# Patient Record
Sex: Female | Born: 1960
Health system: Southern US, Community
[De-identification: ages and names within clinical notes are randomized; demographics above are authoritative.]

## PROBLEM LIST (undated history)

## (undated) DIAGNOSIS — R7301 Impaired fasting glucose: Secondary | ICD-10-CM

## (undated) DIAGNOSIS — Z87442 Personal history of urinary calculi: Secondary | ICD-10-CM

## (undated) DIAGNOSIS — R519 Headache, unspecified: Secondary | ICD-10-CM

## (undated) DIAGNOSIS — I454 Nonspecific intraventricular block: Secondary | ICD-10-CM

## (undated) DIAGNOSIS — I10 Essential (primary) hypertension: Secondary | ICD-10-CM

## (undated) DIAGNOSIS — M1990 Unspecified osteoarthritis, unspecified site: Secondary | ICD-10-CM

## (undated) DIAGNOSIS — M5126 Other intervertebral disc displacement, lumbar region: Secondary | ICD-10-CM

## (undated) DIAGNOSIS — H04123 Dry eye syndrome of bilateral lacrimal glands: Secondary | ICD-10-CM

## (undated) DIAGNOSIS — I493 Ventricular premature depolarization: Secondary | ICD-10-CM

## (undated) DIAGNOSIS — M797 Fibromyalgia: Secondary | ICD-10-CM

## (undated) DIAGNOSIS — R42 Dizziness and giddiness: Secondary | ICD-10-CM

## (undated) DIAGNOSIS — Z8619 Personal history of other infectious and parasitic diseases: Secondary | ICD-10-CM

## (undated) DIAGNOSIS — R32 Unspecified urinary incontinence: Secondary | ICD-10-CM

## (undated) DIAGNOSIS — N390 Urinary tract infection, site not specified: Secondary | ICD-10-CM

## (undated) DIAGNOSIS — K219 Gastro-esophageal reflux disease without esophagitis: Secondary | ICD-10-CM

## (undated) DIAGNOSIS — M8580 Other specified disorders of bone density and structure, unspecified site: Secondary | ICD-10-CM

## (undated) DIAGNOSIS — M5030 Other cervical disc degeneration, unspecified cervical region: Secondary | ICD-10-CM

## (undated) DIAGNOSIS — E785 Hyperlipidemia, unspecified: Secondary | ICD-10-CM

## (undated) DIAGNOSIS — N3281 Overactive bladder: Secondary | ICD-10-CM

## (undated) DIAGNOSIS — K5732 Diverticulitis of large intestine without perforation or abscess without bleeding: Secondary | ICD-10-CM

## (undated) DIAGNOSIS — R51 Headache: Secondary | ICD-10-CM

## (undated) HISTORY — DX: Gastro-esophageal reflux disease without esophagitis: K21.9

## (undated) HISTORY — DX: Essential (primary) hypertension: I10

## (undated) HISTORY — DX: Ventricular premature depolarization: I49.3

## (undated) HISTORY — DX: Dry eye syndrome of bilateral lacrimal glands: H04.123

## (undated) HISTORY — DX: Dizziness and giddiness: R42

## (undated) HISTORY — DX: Impaired fasting glucose: R73.01

## (undated) HISTORY — DX: Urinary tract infection, site not specified: N39.0

## (undated) HISTORY — DX: Other specified disorders of bone density and structure, unspecified site: M85.80

## (undated) HISTORY — DX: Unspecified osteoarthritis, unspecified site: M19.90

## (undated) HISTORY — DX: Hyperlipidemia, unspecified: E78.5

## (undated) HISTORY — DX: Fibromyalgia: M79.7

## (undated) HISTORY — DX: Overactive bladder: N32.81

## (undated) HISTORY — DX: Personal history of urinary calculi: Z87.442

## (undated) HISTORY — PX: OOPHORECTOMY: SHX86

## (undated) HISTORY — DX: Diverticulitis of large intestine without perforation or abscess without bleeding: K57.32

---

## 2004-09-04 ENCOUNTER — Ambulatory Visit: Payer: Self-pay | Admitting: Urology

## 2005-05-22 ENCOUNTER — Ambulatory Visit: Payer: Self-pay | Admitting: Obstetrics and Gynecology

## 2006-07-05 ENCOUNTER — Ambulatory Visit: Payer: Self-pay | Admitting: Obstetrics and Gynecology

## 2006-11-28 ENCOUNTER — Ambulatory Visit: Payer: Self-pay | Admitting: Family Medicine

## 2007-07-15 ENCOUNTER — Ambulatory Visit: Payer: Self-pay | Admitting: Obstetrics and Gynecology

## 2008-07-20 ENCOUNTER — Ambulatory Visit: Payer: Self-pay | Admitting: Obstetrics and Gynecology

## 2009-07-07 DIAGNOSIS — K5732 Diverticulitis of large intestine without perforation or abscess without bleeding: Secondary | ICD-10-CM

## 2009-07-07 HISTORY — DX: Diverticulitis of large intestine without perforation or abscess without bleeding: K57.32

## 2009-07-21 ENCOUNTER — Ambulatory Visit: Payer: Self-pay | Admitting: Obstetrics and Gynecology

## 2010-05-19 ENCOUNTER — Ambulatory Visit: Payer: Self-pay | Admitting: Gastroenterology

## 2010-07-23 HISTORY — PX: TOTAL ABDOMINAL HYSTERECTOMY: SHX209

## 2010-08-04 ENCOUNTER — Ambulatory Visit: Payer: Self-pay | Admitting: Obstetrics and Gynecology

## 2010-08-15 ENCOUNTER — Ambulatory Visit: Payer: Self-pay | Admitting: Obstetrics and Gynecology

## 2010-08-21 ENCOUNTER — Inpatient Hospital Stay: Payer: Self-pay | Admitting: Obstetrics and Gynecology

## 2010-08-23 LAB — PATHOLOGY REPORT

## 2011-03-19 ENCOUNTER — Ambulatory Visit: Payer: Self-pay

## 2011-11-08 ENCOUNTER — Ambulatory Visit: Payer: Self-pay | Admitting: Obstetrics and Gynecology

## 2012-11-27 ENCOUNTER — Ambulatory Visit: Payer: Self-pay | Admitting: Obstetrics and Gynecology

## 2013-01-29 ENCOUNTER — Ambulatory Visit: Payer: Self-pay | Admitting: Orthopedic Surgery

## 2013-11-09 ENCOUNTER — Ambulatory Visit: Payer: Self-pay | Admitting: Gastroenterology

## 2013-11-13 ENCOUNTER — Ambulatory Visit: Payer: Self-pay | Admitting: Gastroenterology

## 2013-11-13 HISTORY — PX: ESOPHAGOGASTRODUODENOSCOPY: SHX1529

## 2013-11-16 LAB — PATHOLOGY REPORT

## 2013-12-02 ENCOUNTER — Ambulatory Visit: Payer: Self-pay

## 2013-12-30 ENCOUNTER — Emergency Department: Payer: Self-pay | Admitting: Emergency Medicine

## 2013-12-30 LAB — BASIC METABOLIC PANEL
ANION GAP: 7 (ref 7–16)
BUN: 13 mg/dL (ref 7–18)
Calcium, Total: 9 mg/dL (ref 8.5–10.1)
Chloride: 105 mmol/L (ref 98–107)
Co2: 27 mmol/L (ref 21–32)
Creatinine: 0.73 mg/dL (ref 0.60–1.30)
EGFR (African American): >60
EGFR (Non-African Amer.): >60
Glucose: 101 mg/dL — ABNORMAL HIGH (ref 65–99)
OSMOLALITY: 278 (ref 275–301)
Potassium: 3.7 mmol/L (ref 3.5–5.1)
Sodium: 139 mmol/L (ref 136–145)

## 2013-12-30 LAB — URINALYSIS, COMPLETE
BACTERIA: NONE SEEN
BILIRUBIN, UR: NEGATIVE
BLOOD: NEGATIVE
Glucose,UR: NEGATIVE mg/dL (ref 0–75)
Ketone: NEGATIVE
Leukocyte Esterase: NEGATIVE
Nitrite: NEGATIVE
Ph: 6 (ref 4.5–8.0)
Protein: NEGATIVE
SQUAMOUS EPITHELIAL: NONE SEEN
Specific Gravity: 1.004 (ref 1.003–1.030)
WBC UR: <1 /HPF (ref 0–5)

## 2013-12-30 LAB — CBC
HCT: 42.1 % (ref 35.0–47.0)
HGB: 14.2 g/dL (ref 12.0–16.0)
MCH: 28.8 pg (ref 26.0–34.0)
MCHC: 33.8 g/dL (ref 32.0–36.0)
MCV: 85 fL (ref 80–100)
PLATELETS: 264 10*3/uL (ref 150–440)
RBC: 4.94 10*6/uL (ref 3.80–5.20)
RDW: 13.5 % (ref 11.5–14.5)
WBC: 11.4 10*3/uL — ABNORMAL HIGH (ref 3.6–11.0)

## 2013-12-30 LAB — TROPONIN I

## 2014-12-22 ENCOUNTER — Other Ambulatory Visit: Payer: Self-pay | Admitting: Family Medicine

## 2014-12-22 DIAGNOSIS — Z1231 Encounter for screening mammogram for malignant neoplasm of breast: Secondary | ICD-10-CM

## 2014-12-31 ENCOUNTER — Telehealth: Payer: Self-pay | Admitting: Family Medicine

## 2014-12-31 NOTE — Telephone Encounter (Signed)
Pt called and stated that she is having pain in back and stomach and thinks it may be a bladder infection. i advised her to go to urgent care and she says she will call us back to schedule a follow up and cpe.

## 2015-01-05 ENCOUNTER — Ambulatory Visit
Admission: RE | Admit: 2015-01-05 | Discharge: 2015-01-05 | Disposition: A | Discharge status: Home / Self Care | Payer: 59 | Source: Ambulatory Visit | Attending: Family Medicine | Admitting: Family Medicine

## 2015-01-05 DIAGNOSIS — Z1231 Encounter for screening mammogram for malignant neoplasm of breast: Secondary | ICD-10-CM | POA: Diagnosis not present

## 2015-01-14 DIAGNOSIS — E785 Hyperlipidemia, unspecified: Secondary | ICD-10-CM | POA: Insufficient documentation

## 2015-01-14 DIAGNOSIS — M1990 Unspecified osteoarthritis, unspecified site: Secondary | ICD-10-CM | POA: Insufficient documentation

## 2015-01-14 DIAGNOSIS — K219 Gastro-esophageal reflux disease without esophagitis: Secondary | ICD-10-CM | POA: Insufficient documentation

## 2015-01-14 DIAGNOSIS — M797 Fibromyalgia: Secondary | ICD-10-CM | POA: Insufficient documentation

## 2015-01-14 DIAGNOSIS — I10 Essential (primary) hypertension: Secondary | ICD-10-CM | POA: Insufficient documentation

## 2015-01-19 ENCOUNTER — Encounter: Payer: Self-pay | Admitting: Family Medicine

## 2015-01-19 ENCOUNTER — Ambulatory Visit (INDEPENDENT_AMBULATORY_CARE_PROVIDER_SITE_OTHER): Payer: 59 | Admitting: Family Medicine

## 2015-01-19 VITALS — BP 131/86 | HR 96 | Temp 98.1°F | Ht 63.0 in | Wt 169.0 lb

## 2015-01-19 DIAGNOSIS — R350 Frequency of micturition: Secondary | ICD-10-CM

## 2015-01-19 DIAGNOSIS — R339 Retention of urine, unspecified: Secondary | ICD-10-CM

## 2015-01-19 DIAGNOSIS — E663 Overweight: Secondary | ICD-10-CM

## 2015-01-19 DIAGNOSIS — Z0000 Encounter for general adult medical examination without abnormal findings: Secondary | ICD-10-CM

## 2015-01-19 LAB — MICROSCOPIC EXAMINATION

## 2015-01-19 NOTE — Patient Instructions (Addendum)
We'll let you know about your labs, either through a mailed letter or you can check MyChart for your results Check out the information at familydoctor.org entitled "What It Takes to Lose Weight" (hand-out at check-out) Try to lose between 1-2 pounds per week by taking in fewer calories and burning off more calories You can succeed by limiting portions, limiting foods dense in calories and fat, becoming more active, and drinking 8 glasses of water a day Don't skip meals, especially breakfast, as skipping meals may alter your metabolism Do not use over-the-counter weight loss pills or gimmicks that claim rapid weight loss A healthy BMI (or body mass index) is between 18.5 and 24.9 You can calculate your ideal BMI at the NIH website http://www.nhlbi.nih.gov/health/educational/lose_wt/BMI/bmicalc.htm We'll refer you to see the urologist about your bladder symptoms  Health Maintenance Adopting a healthy lifestyle and getting preventive care can go a long way to promote health and wellness. Talk with your health care provider about what schedule of regular examinations is right for you. This is a good chance for you to check in with your provider about disease prevention and staying healthy. In between checkups, there are plenty of things you can do on your own. Experts have done a lot of research about which lifestyle changes and preventive measures are most likely to keep you healthy. Ask your health care provider for more information. WEIGHT AND DIET  Eat a healthy diet  Be sure to include plenty of vegetables, fruits, low-fat dairy products, and lean protein.  Do not eat a lot of foods high in solid fats, added sugars, or salt.  Get regular exercise. This is one of the most important things you can do for your health.  Most adults should exercise for at least 150 minutes each week. The exercise should increase your heart rate and make you sweat (moderate-intensity exercise).  Most adults should  also do strengthening exercises at least twice a week. This is in addition to the moderate-intensity exercise.  Maintain a healthy weight  Body mass index (BMI) is a measurement that can be used to identify possible weight problems. It estimates body fat based on height and weight. Your health care provider can help determine your BMI and help you achieve or maintain a healthy weight.  For females 20 years of age and older:   A BMI below 18.5 is considered underweight.  A BMI of 18.5 to 24.9 is normal.  A BMI of 25 to 29.9 is considered overweight.  A BMI of 30 and above is considered obese.  Watch levels of cholesterol and blood lipids  You should start having your blood tested for lipids and cholesterol at 56 years of age, then have this test every 5 years.  You may need to have your cholesterol levels checked more often if:  Your lipid or cholesterol levels are high.  You are older than 56 years of age.  You are at high risk for heart disease.  CANCER SCREENING   Lung Cancer  Lung cancer screening is recommended for adults 55-80 years old who are at high risk for lung cancer because of a history of smoking.  A yearly low-dose CT scan of the lungs is recommended for people who:  Currently smoke.  Have quit within the past 15 years.  Have at least a 30-pack-year history of smoking. A pack year is smoking an average of one pack of cigarettes a day for 1 year.  Yearly screening should continue until it has been   15 years since you quit.  Yearly screening should stop if you develop a health problem that would prevent you from having lung cancer treatment.  Breast Cancer  Practice breast self-awareness. This means understanding how your breasts normally appear and feel.  It also means doing regular breast self-exams. Let your health care provider know about any changes, no matter how small.  If you are in your 20s or 30s, you should have a clinical breast exam (CBE)  by a health care provider every 1-3 years as part of a regular health exam.  If you are 40 or older, have a CBE every year. Also consider having a breast X-ray (mammogram) every year.  If you have a family history of breast cancer, talk to your health care provider about genetic screening.  If you are at high risk for breast cancer, talk to your health care provider about having an MRI and a mammogram every year.  Breast cancer gene (BRCA) assessment is recommended for women who have family members with BRCA-related cancers. BRCA-related cancers include:  Breast.  Ovarian.  Tubal.  Peritoneal cancers.  Results of the assessment will determine the need for genetic counseling and BRCA1 and BRCA2 testing. Cervical Cancer Routine pelvic examinations to screen for cervical cancer are no longer recommended for nonpregnant women who are considered low risk for cancer of the pelvic organs (ovaries, uterus, and vagina) and who do not have symptoms. A pelvic examination may be necessary if you have symptoms including those associated with pelvic infections. Ask your health care provider if a screening pelvic exam is right for you.   The Pap test is the screening test for cervical cancer for women who are considered at risk.  If you had a hysterectomy for a problem that was not cancer or a condition that could lead to cancer, then you no longer need Pap tests.  If you are older than 65 years, and you have had normal Pap tests for the past 10 years, you no longer need to have Pap tests.  If you have had past treatment for cervical cancer or a condition that could lead to cancer, you need Pap tests and screening for cancer for at least 20 years after your treatment.  If you no longer get a Pap test, assess your risk factors if they change (such as having a new sexual partner). This can affect whether you should start being screened again.  Some women have medical problems that increase their  chance of getting cervical cancer. If this is the case for you, your health care provider may recommend more frequent screening and Pap tests.  The human papillomavirus (HPV) test is another test that may be used for cervical cancer screening. The HPV test looks for the virus that can cause cell changes in the cervix. The cells collected during the Pap test can be tested for HPV.  The HPV test can be used to screen women 30 years of age and older. Getting tested for HPV can extend the interval between normal Pap tests from three to five years.  An HPV test also should be used to screen women of any age who have unclear Pap test results.  After 56 years of age, women should have HPV testing as often as Pap tests.  Colorectal Cancer  This type of cancer can be detected and often prevented.  Routine colorectal cancer screening usually begins at 56 years of age and continues through 56 years of age.  Your health   care provider may recommend screening at an earlier age if you have risk factors for colon cancer.  Your health care provider may also recommend using home test kits to check for hidden blood in the stool.  A small camera at the end of a tube can be used to examine your colon directly (sigmoidoscopy or colonoscopy). This is done to check for the earliest forms of colorectal cancer.  Routine screening usually begins at age 92.  Direct examination of the colon should be repeated every 5-10 years through 56 years of age. However, you may need to be screened more often if early forms of precancerous polyps or small growths are found. Skin Cancer  Check your skin from head to toe regularly.  Tell your health care provider about any new moles or changes in moles, especially if there is a change in a mole's shape or color.  Also tell your health care provider if you have a mole that is larger than the size of a pencil eraser.  Always use sunscreen. Apply sunscreen liberally and  repeatedly throughout the day.  Protect yourself by wearing long sleeves, pants, a wide-brimmed hat, and sunglasses whenever you are outside. HEART DISEASE, DIABETES, AND HIGH BLOOD PRESSURE   Have your blood pressure checked at least every 1-2 years. High blood pressure causes heart disease and increases the risk of stroke.  If you are between 29 years and 47 years old, ask your health care provider if you should take aspirin to prevent strokes.  Have regular diabetes screenings. This involves taking a blood sample to check your fasting blood sugar level.  If you are at a normal weight and have a low risk for diabetes, have this test once every three years after 56 years of age.  If you are overweight and have a high risk for diabetes, consider being tested at a younger age or more often. PREVENTING INFECTION  Hepatitis B  If you have a higher risk for hepatitis B, you should be screened for this virus. You are considered at high risk for hepatitis B if:  You were born in a country where hepatitis B is common. Ask your health care provider which countries are considered high risk.  Your parents were born in a high-risk country, and you have not been immunized against hepatitis B (hepatitis B vaccine).  You have HIV or AIDS.  You use needles to inject street drugs.  You live with someone who has hepatitis B.  You have had sex with someone who has hepatitis B.  You get hemodialysis treatment.  You take certain medicines for conditions, including cancer, organ transplantation, and autoimmune conditions. Hepatitis C  Blood testing is recommended for:  Everyone born from [birth year] through 1967.  Anyone with known risk factors for hepatitis C. Sexually transmitted infections (STIs)  You should be screened for sexually transmitted infections (STIs) including gonorrhea and chlamydia if:  You are sexually active and are younger than 56 years of age.  You are older than 56 years of  age and your health care provider tells you that you are at risk for this type of infection.  Your sexual activity has changed since you were last screened and you are at an increased risk for chlamydia or gonorrhea. Ask your health care provider if you are at risk.  If you do not have HIV, but are at risk, it may be recommended that you take a prescription medicine daily to prevent HIV infection. This is called pre-exposure  prophylaxis (PrEP). You are considered at risk if:  You are sexually active and do not regularly use condoms or know the HIV status of your partner(s).  You take drugs by injection.  You are sexually active with a partner who has HIV. Talk with your health care provider about whether you are at high risk of being infected with HIV. If you choose to begin PrEP, you should first be tested for HIV. You should then be tested every 3 months for as long as you are taking PrEP.  PREGNANCY   If you are premenopausal and you may become pregnant, ask your health care provider about preconception counseling.  If you may become pregnant, take 400 to 800 micrograms (mcg) of folic acid every day.  If you want to prevent pregnancy, talk to your health care provider about birth control (contraception). OSTEOPOROSIS AND MENOPAUSE   Osteoporosis is a disease in which the bones lose minerals and strength with aging. This can result in serious bone fractures. Your risk for osteoporosis can be identified using a bone density scan.  If you are 65 years of age or older, or if you are at risk for osteoporosis and fractures, ask your health care provider if you should be screened.  Ask your health care provider whether you should take a calcium or vitamin D supplement to lower your risk for osteoporosis.  Menopause may have certain physical symptoms and risks.  Hormone replacement therapy may reduce some of these symptoms and risks. Talk to your health care provider about whether hormone  replacement therapy is right for you.  HOME CARE INSTRUCTIONS   Schedule regular health, dental, and eye exams.  Stay current with your immunizations.   Do not use any tobacco products including cigarettes, chewing tobacco, or electronic cigarettes.  If you are pregnant, do not drink alcohol.  If you are breastfeeding, limit how much and how often you drink alcohol.  Limit alcohol intake to no more than 1 drink per day for nonpregnant women. One drink equals 12 ounces of beer, 5 ounces of wine, or 1 ounces of hard liquor.  Do not use street drugs.  Do not share needles.  Ask your health care provider for help if you need support or information about quitting drugs.  Tell your health care provider if you often feel depressed.  Tell your health care provider if you have ever been abused or do not feel safe at home. Document Released: 01/22/2011 Document Revised: 11/23/2013 Document Reviewed: 06/10/2013 [employer] Patient Information 2015 [employer]. This information is not intended to replace advice given to you by your health care provider. Make sure you discuss any questions you have with your health care provider.  

## 2015-01-19 NOTE — Progress Notes (Addendum)
BP 131/86 mmHg  Pulse 96  Temp(Src) 98.1 F (36.7 C)  Ht 5\' 3"  (1.6 m)  Wt 169 lb (76.658 kg)  BMI 29.94 kg/m2  SpO2 97%   Subjective:    Patient ID: Whitney Cooper, female    DOB: 1960/09/27, 54 y.o.   MRN: [id number]  HPI: Whitney Cooper is a 54 y.o. female  Chief Complaint  Patient presents with  . Annual Exam   She had a bladder infection June 10th and went to the [facility]; took antibiotics cipro 500 mg BID x 7 days; finished those; no rash or problems; she does not think she empties completely, positional, stands or bends forward; Dr. Charlotte Sanes did her surgery and removed everything, no mention of bladder prolapse and no bladder tack; surgery was 2012 (full hysterectomy with BSO); large fibroid and heavy bleeding, no cancer; mother had trouble with her bladder not emptying completely and had something stretched  Past Medical History  Diagnosis Date  . Hypertension   . Hyperlipidemia   . Osteoarthritis   . GERD (gastroesophageal reflux disease)   . Fibromyalgia    Past Surgical History  Procedure Laterality Date  . Total abdominal hysterectomy  Jan 2012    Complete, due to heavy bleeding  . Total abdominal hysterectomy w/ bilateral salpingoophorectomy     Family History  Problem Relation Age of Onset  . Ovarian cancer Maternal Grandmother 66  . Cancer Maternal Grandmother   . Hypertension Mother   . Cancer Mother     skin  . Heart disease Father     CHF  . Hypertension Father   . Stroke Father     mini strokes  . Cancer Father     skin  . Heart attack Father   . Heart attack Maternal Grandfather   . Heart attack Paternal Grandfather   . Diabetes Maternal Uncle   . Thyroid disease Sister    Relevant past medical, surgical, family and social history reviewed and updated as indicated. Interim medical history since our last visit reviewed. Allergies and medications reviewed and updated.  Review of Systems  Constitutional: Positive for fever (June  11th with UTI, low grade, actually 100.1 or 100.2) and unexpected weight change (gained weight in the last year).  HENT: Negative for nosebleeds.        No trouble hearing  Eyes:       No trouble seeing, father has glaucoma so keeping check  Cardiovascular: Negative for palpitations (saw Dr. Nehemiah Massed, had recheck in the few months; quit caffeine) and leg swelling.  Gastrointestinal: Positive for constipation (IBS, so goes from constipation to loose stools). Negative for blood in stool.  Endocrine: Negative for cold intolerance, heat intolerance, polydipsia and polyphagia.  Genitourinary: Positive for dysuria, urgency, frequency and hematuria (a little blood with infection earlier this month). Negative for difficulty urinating.       She will urinate and then stand up and then feels like she has to go again; feels pressure  Musculoskeletal:       Tendonitis in the left elbow, took ibuprofen  Skin:       No worrisome moles; sees derm every November; one mole removed off of back and right shoulder lesion came off; she'll have that checked by them too  Allergic/Immunologic: Negative for environmental allergies and food allergies.  Neurological: Negative for tremors.  Hematological: Negative for adenopathy. Does not bruise/bleed easily.  Psychiatric/Behavioral: The patient is not nervous/anxious and is not hyperactive.  Per HPI unless specifically indicated above  Health Maintenance  Topic Date Due  . HIV Screening  12/27/1973  . PAP SMEAR  12/27/1976  . INFLUENZA VACCINE  02/21/2015  . MAMMOGRAM  01/04/2017  . TETANUS/TDAP  05/25/2018  . COLONOSCOPY  05/24/2020      Objective:    BP 131/86 mmHg  Pulse 96  Temp(Src) 98.1 F (36.7 C)  Ht 5\' 3"  (1.6 m)  Wt 169 lb (76.658 kg)  BMI 29.94 kg/m2  SpO2 97%  Wt Readings from Last 3 Encounters:  01/19/15 169 lb (76.658 kg)  09/06/14 172 lb (78.019 kg)    Physical Exam  Constitutional: She appears well-developed and well-nourished.   HENT:  Head: Normocephalic and atraumatic.  Eyes: Conjunctivae and EOM are normal. Right eye exhibits no hordeolum. Left eye exhibits no hordeolum. No scleral icterus.  Neck: Carotid bruit is not present. No thyromegaly present.  Cardiovascular: Normal rate, regular rhythm, S1 normal, S2 normal and normal heart sounds.   No extrasystoles are present.  Pulmonary/Chest: Effort normal and breath sounds normal. No respiratory distress. Right breast exhibits no inverted nipple, no mass, no nipple discharge, no skin change and no tenderness. Left breast exhibits no inverted nipple, no mass, no nipple discharge, no skin change and no tenderness. Breasts are symmetrical.  Abdominal: Soft. Normal appearance and bowel sounds are normal. She exhibits no distension, no abdominal bruit, no pulsatile midline mass and no mass. There is no hepatosplenomegaly. There is no tenderness. No hernia.  Genitourinary: Pelvic exam was performed with patient prone. There is no rash or lesion on the right labia. There is no rash or lesion on the left labia.  Musculoskeletal: Normal range of motion. She exhibits no edema.  Lymphadenopathy:       Head (right side): No submandibular adenopathy present.       Head (left side): No submandibular adenopathy present.    She has no cervical adenopathy.    She has no axillary adenopathy.  Neurological: She is alert. She displays no tremor. No cranial nerve deficit. She exhibits normal muscle tone. Gait normal.  Skin: Skin is warm and dry. No bruising and no ecchymosis noted. No cyanosis. No pallor.  Psychiatric: Her speech is normal and behavior is normal. Thought content normal. Her mood appears not anxious. She does not exhibit a depressed mood.      Assessment & Plan:   Problem List Items Addressed This Visit      Other   Overweight (BMI 25.0-29.9)    Modest weight loss encouraged      Urinary retention with incomplete bladder emptying   Relevant Orders   Ambulatory  referral to Urology    Other Visit Diagnoses    Urinary frequency    -  Primary    check urine today; she may have some issues with retention, offered referral to urologist    Relevant Orders    UA/M w/rflx Culture, Routine    Ambulatory referral to Urology    Preventative health care        Relevant Orders    Comprehensive metabolic panel    Lipid Panel w/o Chol/HDL Ratio    CBC with Differential/Platelet    TSH       Follow up plan: Return in about 1 year (around 01/19/2016) for physical exam.  Orders Placed This Encounter  Procedures  . Comprehensive metabolic panel  . Lipid Panel w/o Chol/HDL Ratio  . CBC with Differential/Platelet  . TSH  . UA/M w/rflx  Culture, Routine  . Ambulatory referral to Urology   An After Visit Summary was printed and given to the patient.

## 2015-01-19 NOTE — Addendum Note (Signed)
Addended by: LADA, Satira Anis on: 01/19/2015 10:51 AM   Modules accepted: Orders, SmartSet

## 2015-01-19 NOTE — Assessment & Plan Note (Signed)
Modest weight loss encouraged

## 2015-01-20 LAB — CBC WITH DIFFERENTIAL/PLATELET
BASOS ABS: 0 10*3/uL (ref 0.0–0.2)
Basos: 0 %
EOS (ABSOLUTE): 0.2 10*3/uL (ref 0.0–0.4)
EOS: 2 %
HEMOGLOBIN: 14 g/dL (ref 11.1–15.9)
Hematocrit: 40.8 % (ref 34.0–46.6)
IMMATURE GRANULOCYTES: 0 %
Immature Grans (Abs): 0 10*3/uL (ref 0.0–0.1)
LYMPHS: 25 %
Lymphocytes Absolute: 2.2 10*3/uL (ref 0.7–3.1)
MCH: 29 pg (ref 26.6–33.0)
MCHC: 34.3 g/dL (ref 31.5–35.7)
MCV: 85 fL (ref 79–97)
MONOCYTES: 7 %
MONOS ABS: 0.6 10*3/uL (ref 0.1–0.9)
Neutrophils Absolute: 5.6 10*3/uL (ref 1.4–7.0)
Neutrophils: 66 %
PLATELETS: 351 10*3/uL (ref 150–379)
RBC: 4.83 x10E6/uL (ref 3.77–5.28)
RDW: 13.5 % (ref 12.3–15.4)
WBC: 8.7 10*3/uL (ref 3.4–10.8)

## 2015-01-20 LAB — LIPID PANEL W/O CHOL/HDL RATIO
Cholesterol, Total: 196 mg/dL (ref 100–199)
HDL: 42 mg/dL (ref >39)
LDL Calculated: 108 mg/dL — ABNORMAL HIGH (ref 0–99)
Triglycerides: 228 mg/dL — ABNORMAL HIGH (ref 0–149)
VLDL Cholesterol Cal: 46 mg/dL — ABNORMAL HIGH (ref 5–40)

## 2015-01-20 LAB — COMPREHENSIVE METABOLIC PANEL
ALT: 23 IU/L (ref 0–32)
AST: 20 IU/L (ref 0–40)
Albumin/Globulin Ratio: 1.8 (ref 1.1–2.5)
Albumin: 4.6 g/dL (ref 3.5–5.5)
Alkaline Phosphatase: 90 IU/L (ref 39–117)
BUN/Creatinine Ratio: 13 (ref 9–23)
BUN: 9 mg/dL (ref 6–24)
Bilirubin Total: 0.4 mg/dL (ref 0.0–1.2)
CO2: 25 mmol/L (ref 18–29)
Calcium: 10.1 mg/dL (ref 8.7–10.2)
Chloride: 102 mmol/L (ref 97–108)
Creatinine, Ser: 0.71 mg/dL (ref 0.57–1.00)
GFR calc Af Amer: 110 mL/min/{1.73_m2} (ref >59)
GFR calc non Af Amer: 96 mL/min/{1.73_m2} (ref >59)
GLOBULIN, TOTAL: 2.6 g/dL (ref 1.5–4.5)
GLUCOSE: 105 mg/dL — AB (ref 65–99)
Potassium: 4.4 mmol/L (ref 3.5–5.2)
Sodium: 143 mmol/L (ref 134–144)
Total Protein: 7.2 g/dL (ref 6.0–8.5)

## 2015-01-20 LAB — TSH: TSH: 5.06 u[IU]/mL — AB (ref 0.450–4.500)

## 2015-01-21 LAB — UA/M W/RFLX CULTURE, ROUTINE: ORGANISM ID, BACTERIA: NO GROWTH

## 2015-01-24 ENCOUNTER — Encounter: Payer: Self-pay | Admitting: Family Medicine

## 2015-01-24 DIAGNOSIS — R7301 Impaired fasting glucose: Secondary | ICD-10-CM

## 2015-01-24 DIAGNOSIS — R946 Abnormal results of thyroid function studies: Secondary | ICD-10-CM | POA: Insufficient documentation

## 2015-01-24 HISTORY — DX: Impaired fasting glucose: R73.01

## 2015-01-27 ENCOUNTER — Other Ambulatory Visit: Payer: Self-pay

## 2015-01-29 MED ORDER — METOPROLOL SUCCINATE ER 50 MG PO TB24: 50.0000 mg | ORAL_TABLET | Freq: Every day | ORAL | Status: DC

## 2015-02-11 ENCOUNTER — Telehealth: Payer: Self-pay

## 2015-02-11 NOTE — Telephone Encounter (Signed)
Received a voicemail from patient. Called and spoke with patient, she will pick up a copy of her labs from 01/19/15.

## 2015-02-14 ENCOUNTER — Ambulatory Visit: Payer: 59 | Admitting: Urology

## 2015-03-15 ENCOUNTER — Telehealth: Payer: Self-pay

## 2015-03-15 ENCOUNTER — Encounter: Payer: Self-pay | Admitting: Urology

## 2015-03-15 ENCOUNTER — Ambulatory Visit (INDEPENDENT_AMBULATORY_CARE_PROVIDER_SITE_OTHER): Payer: 59 | Admitting: Urology

## 2015-03-15 ENCOUNTER — Ambulatory Visit
Admission: RE | Admit: 2015-03-15 | Discharge: 2015-03-15 | Disposition: A | Discharge status: Home / Self Care | Payer: 59 | Source: Ambulatory Visit | Attending: Urology | Admitting: Urology

## 2015-03-15 VITALS — BP 133/84 | HR 90 | Ht 63.5 in | Wt 163.4 lb

## 2015-03-15 DIAGNOSIS — R300 Dysuria: Secondary | ICD-10-CM | POA: Diagnosis not present

## 2015-03-15 DIAGNOSIS — R350 Frequency of micturition: Secondary | ICD-10-CM | POA: Diagnosis not present

## 2015-03-15 DIAGNOSIS — R109 Unspecified abdominal pain: Secondary | ICD-10-CM | POA: Diagnosis not present

## 2015-03-15 DIAGNOSIS — N952 Postmenopausal atrophic vaginitis: Secondary | ICD-10-CM | POA: Diagnosis not present

## 2015-03-15 DIAGNOSIS — R32 Unspecified urinary incontinence: Secondary | ICD-10-CM | POA: Diagnosis not present

## 2015-03-15 LAB — URINALYSIS, COMPLETE
Bilirubin, UA: NEGATIVE
Glucose, UA: NEGATIVE
Ketones, UA: NEGATIVE
LEUKOCYTES UA: NEGATIVE
Nitrite, UA: NEGATIVE
PH UA: 6.5 (ref 5.0–7.5)
Protein, UA: NEGATIVE
RBC, UA: NEGATIVE
Specific Gravity, UA: 1.01 (ref 1.005–1.030)
Urobilinogen, Ur: 0.2 mg/dL (ref 0.2–1.0)

## 2015-03-15 LAB — MICROSCOPIC EXAMINATION
BACTERIA UA: NONE SEEN
Epithelial Cells (non renal): NONE SEEN /hpf (ref 0–10)
RBC MICROSCOPIC, UA: NONE SEEN /HPF (ref 0–?)
WBC UA: NONE SEEN /HPF (ref 0–?)

## 2015-03-15 LAB — BLADDER SCAN AMB NON-IMAGING: Scan Result: 33

## 2015-03-15 MED ORDER — ESTRADIOL 0.1 MG/GM VA CREA: 1.0000 | TOPICAL_CREAM | Freq: Every day | VAGINAL | Status: DC

## 2015-03-15 NOTE — Progress Notes (Signed)
03/15/2015 9:07 AM   Whitney Cooper 11-19-1960 [id number]  Referring provider: Arnetha Courser, MD [address] [place], [place] [address]  Chief Complaint  Patient presents with  . Urinary Frequency    referred by [facility] Dr. Marzetta Board  . Urinary Retention    HPI: Whitney Cooper is a 54 year old white female who presents today at the request of her primary care physician, Dr. Robby Sermon for frequent urination and burning.  Patient has had baseline symptoms of frequent urination, urgency, straining you where he, nocturia, stress urinary incontinence, urgency incontinence, intermittency and post void dribbling for several years.  In fact 10 years ago, she was enrolled in an overactive bladder study with her gynecologist. She states she took some medications during the study, but they provided no relief of her symptoms.  Her PVR today is 33 mL.  Her UA is unremarkable.  More recently, she had a fever 101 and felt that she had a UTI. She was seen at the walk-in clinic on 12/31/2014. She was prescribed an antibiotic urine culture returned negative. She states the antibiotic did provide relief and when she followed up with her primary care physician on 01/19/2015 her urine culture was negative at that time. She also states that she experienced a urinary tract infection in February earlier this year, but I do not have those laboratory results available to me at this visit. She has not had any microscopic or gross hematuria.  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  Uristat available over-the-counter seems to help ease her urinary symptoms but does not completely abate.  She does not consume caffeinated beverages, juices, sodas or alcohol. She states she only drinks water.  She has irritable bowel syndrome associated with diarrhea and fibromyalgia.  She also has been experiencing right-sided flank pain  intermittently over the last several months.  She does have a prior history of kidney stone disease, but she states she has not been bothered with stones for several years. She has not had a cystoscopic examination.  She is also experiencing dyspareunia.  Her pain during sexual intercourse is a result of vaginal dryness and friction.   PMH: Past Medical History  Diagnosis Date  . Hypertension   . Hyperlipidemia   . Osteoarthritis   . GERD (gastroesophageal reflux disease)   . Fibromyalgia   . Recurrent UTI   . OAB (overactive bladder)   . History of kidney stones     Surgical History: Past Surgical History  Procedure Laterality Date  . Total abdominal hysterectomy  Jan 2012    Complete, due to heavy bleeding    Home Medications:    Medication List       This list is accurate as of: 03/15/15  9:07 AM.  Always use your most recent med list.               amitriptyline 10 MG tablet  Commonly known as:  ELAVIL  Take 20 mg by mouth at bedtime.     atorvastatin 20 MG tablet  Commonly known as:  LIPITOR  Take 20 mg by mouth at bedtime.     Co Q-10 100 MG Caps  Take by mouth daily.     metoprolol succinate 50 MG 24 hr tablet  Commonly known as:  TOPROL XL  Take 1 tablet (50 mg total) by mouth daily.     MULTI-VITAMINS  Tabs  Take by mouth daily.     pantoprazole 40 MG tablet  Commonly known as:  PROTONIX  Take 40 mg by mouth daily.     Vitamin D3 2000 UNITS capsule  Take 2,000 Units by mouth daily.        Allergies:  Allergies  Allergen Reactions  . Codeine Other (See Comments)  . Sulfa Antibiotics Rash    Family History: Family History  Problem Relation Age of Onset  . Ovarian cancer Maternal Grandmother 66  . Cancer Maternal Grandmother   . Hypertension Mother   . Cancer Mother     skin  . Heart disease Father     CHF  . Hypertension Father   . Stroke Father     mini strokes  . Cancer Father     skin  . Heart attack Father   . Heart  attack Maternal Grandfather   . Heart attack Paternal Grandfather   . Diabetes Maternal Uncle   . Thyroid disease Sister     Social History:  reports that she has never smoked. She has never used smokeless tobacco. She reports that she does not drink alcohol or use illicit drugs.  ROS: UROLOGY Frequent Urination?: Yes Hard to postpone urination?: Yes Burning/pain with urination?: Yes Get up at night to urinate?: Yes Leakage of urine?: Yes Urine stream starts and stops?: Yes Trouble starting stream?: No Do you have to strain to urinate?: No Blood in urine?: No Urinary tract infection?: Yes Sexually transmitted disease?: No Injury to kidneys or bladder?: No Painful intercourse?: Yes Weak stream?: No Currently pregnant?: No Vaginal bleeding?: No Last menstrual period?: n  Gastrointestinal Nausea?: No Vomiting?: No Indigestion/heartburn?: Yes Diarrhea?: Yes Constipation?: No  Constitutional Fever: Yes Night sweats?: Yes Weight loss?: No Fatigue?: No  Skin Skin rash/lesions?: No Itching?: No  Eyes Blurred vision?: No Double vision?: No  Ears/Nose/Throat Sore throat?: No Sinus problems?: No  Hematologic/Lymphatic Swollen glands?: No Easy bruising?: No  Cardiovascular Leg swelling?: No Chest pain?: No  Respiratory Cough?: No Shortness of breath?: No  Endocrine Excessive thirst?: No  Musculoskeletal Back pain?: Yes Joint pain?: Yes  Neurological Headaches?: No Dizziness?: No  Psychologic Depression?: No Anxiety?: No  Physical Exam: BP 133/84 mmHg  Pulse 90  Ht 5' 3.5" (1.613 m)  Wt 163 lb 6.4 oz (74.118 kg)  BMI 28.49 kg/m2  Constitutional:  Alert and oriented, No acute distress. HEENT: [place] AT, moist mucus membranes.  Trachea midline, no masses. Cardiovascular: No clubbing, cyanosis, or edema. Respiratory: Normal respiratory effort, no increased work of breathing. GI: Abdomen is soft, non tender, non distended, no abdominal masses GU:  No CVA tenderness. No tenderness, inflammation, rashes or lesions of external genitalia, urethral meatus normal, no discharge (Moderate urethral hypermobility without demonstrable SUI), urethra normal, bladder non-tender, no palpable masses (Fairly good vaginal vault support, no evidence of apical descent, significant cystocele or rectocele with Valsalva), normal vaginal walls and mucosa, no lesions, cervix, uterus and adnexa are surgically absent, no normal cervix,  palpable masses. Skin: No rashes, bruises or suspicious lesions. Lymph: No cervical or inguinal adenopathy. Neurologic: Grossly intact, no focal deficits, moving all 4 extremities. Psychiatric: Normal mood and affect.  Laboratory Data: Results for orders placed or performed in visit on 03/15/15  Microscopic Examination  Result Value Ref Range   WBC, UA None seen 0 -  5 /hpf   RBC, UA None seen 0 -  2 /hpf   Epithelial Cells (non renal) None seen 0 -  10 /hpf   Bacteria, UA None seen None seen/Few  Urinalysis, Complete  Result Value Ref Range   Specific Gravity, UA 1.010 1.005 - 1.030   pH, UA 6.5 5.0 - 7.5   Color, UA Yellow Yellow   Appearance Ur Clear Clear   Leukocytes, UA Negative Negative   Protein, UA Negative Negative/Trace   Glucose, UA Negative Negative   Ketones, UA Negative Negative   RBC, UA Negative Negative   Bilirubin, UA Negative Negative   Urobilinogen, Ur 0.2 0.2 - 1.0 mg/dL   Nitrite, UA Negative Negative   Microscopic Examination See below:   BLADDER SCAN AMB NON-IMAGING  Result Value Ref Range   Scan Result 33     Lab Results  Component Value Date   WBC 8.7 01/19/2015   HGB 14.2 12/30/2013   HCT 40.8 01/19/2015   MCV 85 12/30/2013   PLT 264 12/30/2013    Lab Results  Component Value Date   CREATININE 0.71 01/19/2015    Urinalysis  Assessment & Plan:    1. Urinary frequency:    Patient has had a long-standing history of urinary frequency with negative urine cultures. She also has IBS  and fibromyalgia. I'm highly suspicious for interstitial cystitis. If no other etiologies found for her irritative voiding symptoms, she may benefit from pelvic floor physical therapy   - Urinalysis, Complete - BLADDER SCAN AMB NON-IMAGING  2. [place]:    Patient has long-standing irritative voiding symptoms. She has not been evaluated with cystoscopy.   Given irritative voiding symptoms, recommend office cystoscopy/ urine cytology to r/o CIS.     3. Incontinence:    Patient has mixed urinary incontinence with urge and stress associated with post void dribbling.   She had been involved with an overactive bladder study 10 years ago through her gynecologist office and placed on the medication which she found ineffective.   I believe she may benefit from UDS after cystoscopically/urine cytology have ruled out CIS and KUB has ruled out nephrolithiasis as a cause of her urinary symptoms.  4. Right flank pain:    The patient's past history of nephrolithiasis, I will obtain a KUB to rule out stones as a cause of any of her urinary symptomatology.  5. Atrophic vaginitis:   The patient was found to have atrophic vaginitis on exam and is experiencing dyspareunia of a frictional nature.  Patient was prescribed vaginal estrogen cream (ESTRACE) and instructed to apply 0.5mg  (pea-sized amount)  just inside the vaginal introitus with a finger-tip every night for two weeks and then Monday, Wednesday and Friday nights.  I explained to the patient that vaginally administered estrogen, which causes only a slight increase in the blood estrogen levels, have fewer contraindications and adverse systemic effects that oral HT.   No Follow-up on file.  Zara Council, [facility] [address] [place], [place] [address] [phone]

## 2015-03-15 NOTE — Telephone Encounter (Signed)
LMOM

## 2015-03-15 NOTE — Telephone Encounter (Signed)
-----   Message from Nori Riis, PA-C sent at 03/15/2015 11:25 AM EDT ----- Please let the patient know that no stone was seen.

## 2015-03-16 NOTE — Telephone Encounter (Signed)
Pt returned call. Made aware of negative study. Pt voiced understanding.

## 2015-03-16 NOTE — Telephone Encounter (Signed)
-----   Message from Nori Riis, PA-C sent at 03/15/2015 11:25 AM EDT ----- Please let the patient know that no stone was seen.

## 2015-03-22 ENCOUNTER — Other Ambulatory Visit: Payer: Self-pay

## 2015-03-22 MED ORDER — ATORVASTATIN CALCIUM 20 MG PO TABS: 20.0000 mg | ORAL_TABLET | Freq: Every day | ORAL | Status: DC

## 2015-03-22 NOTE — Telephone Encounter (Signed)
Routing to provider  

## 2015-04-19 ENCOUNTER — Other Ambulatory Visit: Payer: 59

## 2015-04-19 DIAGNOSIS — R7989 Other specified abnormal findings of blood chemistry: Secondary | ICD-10-CM

## 2015-04-20 LAB — TSH: TSH: 4.63 u[IU]/mL — AB (ref 0.450–4.500)

## 2015-04-26 ENCOUNTER — Telehealth: Payer: Self-pay | Admitting: Family Medicine

## 2015-04-26 NOTE — Telephone Encounter (Signed)
Please let patient know that her thyroid test has improved; it is still just a little bit outside the normal range We'll follow that over the coming months and years; it may go back to normal, or she may develop an underactive thyroid that we can treat with a little replacement (pill) She does not need medicine now Let us know in the future if fatigued, constipated, gaining weight, etc. Per the lab explanation in June, her glucose was a little elevated but we weren't going to recheck that for a year (next June)

## 2015-04-26 NOTE — Telephone Encounter (Signed)
Routing to provider. Is there anything that I need to tell this patient regarding her labs?

## 2015-04-26 NOTE — Telephone Encounter (Signed)
Called and notified patient of results.

## 2015-04-26 NOTE — Telephone Encounter (Signed)
Pt called stated someone was supposed to call her back with lab results, stated she has not heard anything. Pt stated there was concern about her sugar but her sugar was not tested.Please call her ASAP. Thanks.

## 2015-05-02 ENCOUNTER — Other Ambulatory Visit: Payer: Self-pay

## 2015-05-02 NOTE — Telephone Encounter (Signed)
Routing to provider  

## 2015-05-03 MED ORDER — AMITRIPTYLINE HCL 10 MG PO TABS: 20.0000 mg | ORAL_TABLET | Freq: Every day | ORAL | Status: DC

## 2015-05-03 NOTE — Telephone Encounter (Signed)
rx approved

## 2015-06-21 ENCOUNTER — Ambulatory Visit (INDEPENDENT_AMBULATORY_CARE_PROVIDER_SITE_OTHER): Payer: 59 | Admitting: Urology

## 2015-06-21 VITALS — Ht 63.5 in | Wt 156.7 lb

## 2015-06-21 DIAGNOSIS — N393 Stress incontinence (female) (male): Secondary | ICD-10-CM

## 2015-06-21 DIAGNOSIS — I493 Ventricular premature depolarization: Secondary | ICD-10-CM | POA: Insufficient documentation

## 2015-06-21 DIAGNOSIS — I10 Essential (primary) hypertension: Secondary | ICD-10-CM

## 2015-06-21 DIAGNOSIS — N3281 Overactive bladder: Secondary | ICD-10-CM

## 2015-06-21 DIAGNOSIS — R300 Dysuria: Secondary | ICD-10-CM | POA: Diagnosis not present

## 2015-06-21 DIAGNOSIS — E782 Mixed hyperlipidemia: Secondary | ICD-10-CM | POA: Insufficient documentation

## 2015-06-21 DIAGNOSIS — R350 Frequency of micturition: Secondary | ICD-10-CM

## 2015-06-21 HISTORY — DX: Essential (primary) hypertension: I10

## 2015-06-21 HISTORY — DX: Ventricular premature depolarization: I49.3

## 2015-06-21 LAB — MICROSCOPIC EXAMINATION
BACTERIA UA: NONE SEEN
RBC, UA: NONE SEEN /hpf (ref 0–?)

## 2015-06-21 LAB — URINALYSIS, COMPLETE
BILIRUBIN UA: NEGATIVE
Glucose, UA: NEGATIVE
Ketones, UA: NEGATIVE
NITRITE UA: NEGATIVE
PH UA: 6.5 (ref 5.0–7.5)
Protein, UA: NEGATIVE
Urobilinogen, Ur: 0.2 mg/dL (ref 0.2–1.0)

## 2015-06-21 MED ORDER — SOLIFENACIN SUCCINATE 5 MG PO TABS: 5.0000 mg | ORAL_TABLET | Freq: Every day | ORAL | Status: DC

## 2015-06-21 MED ORDER — LIDOCAINE HCL 2 % EX GEL
1.0000 "application " | Freq: Once | CUTANEOUS | Status: AC
  Administered 2015-06-21: 1 via URETHRAL

## 2015-06-21 MED ORDER — CIPROFLOXACIN HCL 500 MG PO TABS
500.0000 mg | ORAL_TABLET | Freq: Once | ORAL | Status: AC
  Administered 2015-06-21: 500 mg via ORAL

## 2015-06-21 NOTE — Progress Notes (Signed)
06/21/2015 9:12 AM   Whitney Cooper 07-21-1961 [id number]  Referring provider: Arnetha Courser, MD [address] [place], [place] [address]  Chief Complaint  Patient presents with  . Cysto    HPI:1. Urinary frequency: Shannon:    1. Urinary frequency: Patient has had a long-standing history of urinary frequency with negative urine cultures. She also has IBS and fibromyalgia. I'm highly suspicious for interstitial cystitis. If no other etiologies found for her irritative voiding symptoms, she may benefit from pelvic floor physical therapy    Patient has mixed urinary incontinence with urge and stress associated with post void dribbling. She had been involved with an overactive bladder study 10 years ago through her gynecologist office and placed on the medication which she found ineffective. I believe she may benefit from UDS after cystoscopically/urine cytology have ruled out CIS and KUB has ruled out nephrolithiasis as a cause of her urinary symptoms.   Also prescribed Estrace last visit  Today the patient reports hourly frequency. She has difficulty sitting through 2 are moving. She gets up once or twice a night. Her symptoms of present for multiple years. She's not tried other medications other than what is listed above. She has no urge incontinence. She is mild stress incontinence not requiring a pad. She's had kidney stones but no previous GU surgery. She does not have pelvic pain. She is intermittent mild dyspareunia. She voids frequently because of urgency and fullness  Modifying factors: There are no other modifying factors  Associated signs and symptoms: There are no other associated signs and symptoms Aggravating and relieving factors: There are no other aggravating or relieving factors Severity: Moderate Duration: Persistent    PMH: Past Medical History  Diagnosis Date  . Hypertension   . Hyperlipidemia   . Osteoarthritis   . GERD (gastroesophageal reflux disease)     . Fibromyalgia   . Recurrent UTI   . OAB (overactive bladder)   . History of kidney stones     Surgical History: Past Surgical History  Procedure Laterality Date  . Total abdominal hysterectomy  Jan 2012    Complete, due to heavy bleeding    Home Medications:    Medication List       This list is accurate as of: 06/21/15  9:12 AM.  Always use your most recent med list.               amitriptyline 10 MG tablet  Commonly known as:  ELAVIL  Take 2 tablets (20 mg total) by mouth at bedtime.     atorvastatin 20 MG tablet  Commonly known as:  LIPITOR  Take 1 tablet (20 mg total) by mouth at bedtime.     Co Q-10 100 MG Caps  Take by mouth daily.     estradiol 0.1 MG/GM vaginal cream  Commonly known as:  ESTRACE  Place 1 Applicatorful vaginally at bedtime.     metoprolol succinate 50 MG 24 hr tablet  Commonly known as:  TOPROL XL  Take 1 tablet (50 mg total) by mouth daily.     MULTI-VITAMINS Tabs  Take by mouth daily.     pantoprazole 40 MG tablet  Commonly known as:  PROTONIX  Take 40 mg by mouth daily.     Vitamin D3 2000 UNITS capsule  Take 2,000 Units by mouth daily.        Allergies:  Allergies  Allergen Reactions  . Codeine Other (See Comments)  . Sulfa Antibiotics Rash  Family History: Family History  Problem Relation Age of Onset  . Ovarian cancer Maternal Grandmother 66  . Cancer Maternal Grandmother   . Hypertension Mother   . Cancer Mother     skin  . Heart disease Father     CHF  . Hypertension Father   . Stroke Father     mini strokes  . Cancer Father     skin  . Heart attack Father   . Heart attack Maternal Grandfather   . Heart attack Paternal Grandfather   . Diabetes Maternal Uncle   . Thyroid disease Sister     Social History:  reports that she has never smoked. She has never used smokeless tobacco. She reports that she does not drink alcohol or use illicit drugs.  ROS: UROLOGY Frequent Urination?: Yes Hard to  postpone urination?: Yes Burning/pain with urination?: No Get up at night to urinate?: No Leakage of urine?: No Urine stream starts and stops?: Yes Trouble starting stream?: No Do you have to strain to urinate?: No Blood in urine?: No Urinary tract infection?: No Sexually transmitted disease?: No Injury to kidneys or bladder?: No Painful intercourse?: No Weak stream?: No Currently pregnant?: No Vaginal bleeding?: No Last menstrual period?: 2011  Gastrointestinal Nausea?: No Vomiting?: No Indigestion/heartburn?: Yes Diarrhea?: No Constipation?: No  Constitutional Fever: No Night sweats?: No Weight loss?: No Fatigue?: No  Skin Skin rash/lesions?: No Itching?: No  Eyes Blurred vision?: No Double vision?: No  Ears/Nose/Throat Sore throat?: No Sinus problems?: Yes  Hematologic/Lymphatic Swollen glands?: No Easy bruising?: No  Cardiovascular Leg swelling?: No Chest pain?: No  Respiratory Cough?: No Shortness of breath?: No  Endocrine Excessive thirst?: No  Musculoskeletal Back pain?: No Joint pain?: No  Neurological Headaches?: No Dizziness?: No  Psychologic Depression?: No Anxiety?: No  Physical Exam: Ht 5' 3.5" (1.613 m)  Wt 156 lb 11.2 oz (71.079 kg)  BMI 27.32 kg/m2  Constitutional:  Alert and oriented, No acute distress. HEENT: [place] AT, moist mucus membranes.  Trachea midline, no masses. Cardiovascular: No clubbing, cyanosis, or edema. Respiratory: Normal respiratory effort, no increased work of breathing. GI: Abdomen is soft, nontender, nondistended, no abdominal masses GU:  Well supported bladder neck and no stress incontinence Skin: No rashes, bruises or suspicious lesions. Lymph: No cervical or inguinal adenopathy. Neurologic: Grossly intact, no focal deficits, moving all 4 extremities. Psychiatric: Normal mood and affect.  Laboratory Data: Lab Results  Component Value Date   WBC 8.7 01/19/2015   HGB 14.2 12/30/2013   HCT 40.8  01/19/2015   MCV 85 12/30/2013   PLT 264 12/30/2013    Lab Results  Component Value Date   CREATININE 0.71 01/19/2015    No results found for: PSA  No results found for: TESTOSTERONE  No results found for: HGBA1C  Urinalysis    Component Value Date/Time   COLORURINE Colorless 12/30/2013 2325   APPEARANCEUR Clear 12/30/2013 2325   LABSPEC 1.004 12/30/2013 2325   PHURINE 6.0 12/30/2013 2325   GLUCOSEU Negative 03/15/2015 0844   GLUCOSEU Negative 12/30/2013 2325   HGBUR Negative 12/30/2013 2325   BILIRUBINUR Negative 03/15/2015 0844   BILIRUBINUR Negative 12/30/2013 2325   KETONESUR Negative 12/30/2013 2325   PROTEINUR Negative 12/30/2013 2325   NITRITE Negative 03/15/2015 0844   NITRITE Negative 12/30/2013 2325   LEUKOCYTESUR Negative 03/15/2015 0844   LEUKOCYTESUR Negative 12/30/2013 2325   Cystoscopy: After verbal consent sterile cystoscopy was performed. The bladder mucosa and trigone were normal. There is no stitch or foreign  body or carcinoma. There was no bladder pain with insertion or with filling of her bladder. She tolerated the procedure well  Pertinent Imaging: None  Assessment & Plan:  The patient has urinary frequency and mild stress incontinence. My index of suspicion is low that she is interstitial cystitis. The role of behavioral and medical therapy discussed.  The patient will be started on Vesicare 5 mg samples and prescription. I will reassess her in approximately 1 month. She may have tried Toviaz in the past. I will consult physical therapy. She reaches her treatment goal I will stop the medication moving forward and this was discussed. Physical therapy will be directed towards her mild stress incontinence and her OAB.  1. Mild stress incontinence 2. Urinary frequency 3. Nocturia  Reece Packer, MD  [facility] [address] [place], [place] [address] [phone]

## 2015-06-21 NOTE — Addendum Note (Signed)
Addended by: Wilson Singer on: 06/21/2015 09:18 AM   Modules accepted: Orders

## 2015-07-08 ENCOUNTER — Encounter: Payer: Self-pay | Admitting: Physical Therapy

## 2015-07-08 ENCOUNTER — Ambulatory Visit: Payer: 59 | Attending: Family Medicine | Admitting: Physical Therapy

## 2015-07-08 DIAGNOSIS — R279 Unspecified lack of coordination: Secondary | ICD-10-CM | POA: Diagnosis not present

## 2015-07-08 DIAGNOSIS — M629 Disorder of muscle, unspecified: Secondary | ICD-10-CM

## 2015-07-08 NOTE — Therapy (Addendum)
[place] MAIN [facility] SERVICES [address] [place], [place], [address] Phone: [phone]   Fax:  [phone]  Physical Therapy Evaluation  Patient Details  Name: Whitney Cooper MRN: [id number] Date of Birth: 1961/01/08 No Data Recorded  Encounter Date: 07/08/2015      PT End of Session - 07/11/15 2357    Visit Number 1   Number of Visits 12   Date for PT Re-Evaluation 09/30/15   PT Start Time 0800   PT Stop Time 0900   PT Time Calculation (min) 60 min   Activity Tolerance Patient tolerated treatment well;No increased pain   Behavior During Therapy [facility] for tasks assessed/performed      Past Medical History  Diagnosis Date  . Hypertension   . Hyperlipidemia   . Osteoarthritis   . GERD (gastroesophageal reflux disease)   . Fibromyalgia   . Recurrent UTI   . OAB (overactive bladder)   . History of kidney stones     Past Surgical History  Procedure Laterality Date  . Total abdominal hysterectomy  Jan 2012    Complete, due to heavy bleeding and grape-fruit size fibroid     There were no vitals filed for this visit.  Visit Diagnosis:  Lack of coordination - Plan: PT plan of care cert/re-cert  Fascial defect - Plan: PT plan of care cert/re-cert          [facility] PT Assessment - 07/12/15 0007    Assessment   Medical Diagnosis N39.3, R35.0    Precautions   Precautions None   Restrictions   Weight Bearing Restrictions No   Prior Function   Level of Independence Independent   Observation/Other Assessments   Observations slumped posture   Other Surveys  --  PFDI 68%    Coordination   Gross Motor Movements are Fluid and Coordinated --  abdominal straining/pelvic floor straining w/ cue for bowel    Posture/Postural Control   Posture/Postural Control --  lumbopelvic instability w/ ASLR   AROM   Overall AROM Comments hinging at L4 in lumbar ext standing   Palpation   Palpation comment tenderness at L LQ area    increased scar restriction over abdomen, increased tensions at upper traps   Bed Mobility   Bed Mobility --  crunch method OOB, able to log roll                   [facility] Adult PT Treatment/Exercise - 07/12/15 0007    Self-Care   Self-Care --  POC, goals, anatomy/phsyiology   Neuro Re-ed    Neuro Re-ed Details  toileting posture, log rolling, sitting posture   diaphragmatic breathing   Manual Therapy   Manual therapy comments upper trap releases bilaterally-STM                PT Education - 07/11/15 2356    Education provided Yes   Education Details POC, goals, HEP, anatomy/ physiology   Person(s) Educated Patient   Methods Explanation;Demonstration;Tactile cues;Verbal cues;Handout   Comprehension Verbalized understanding;Returned demonstration             PT Long Term Goals - 07/11/15 2358    PT LONG TERM GOAL #1   Title Pt will  report decreased frequency of urination from 2x/hr to 1x/ every 2 hr in order to compeltes tasks at work .   Time 12   Period Weeks   Status New   PT LONG TERM GOAL #2   Title Pt  will report no leakage when she receives a hug from her son nor when she gets up from sitting.    Time 12   Period Weeks   Status New   PT LONG TERM GOAL #3   Title Pt will report decreased with initial penetration from 6/10 to < 3/10 pain 100% of the time.    Time 12   Period Weeks   Status New   PT LONG TERM GOAL #4   Title Pt will be compliant with relaxation techniques to better manage IBS in order to decrease diarrhea episodes from 1x/ mo to zero times a month.    Time 12   Period Weeks   Status New   PT LONG TERM GOAL #5   Title Pt will decrease her score on PFDI from 68% to < 50% in order to improve urogenital/ bowel function.    Time 12   Period Weeks   Status New               Plan - 07/12/15 0003    Clinical Impression Statement Pt is a 54 yo female whose S & Sx consist of spinal deficits, chest breathing pattern,  poor pelvic floor and deep core coordination and strength,  increased abdominal scar restriction, tenderness upon palpation at L LQ abdomen, and poor toileting posture/ body mechanics that pose downward pressure on pelvic floor. These deficits limit her QOL, ADLs, and relationship with husband.    Pt will benefit from skilled therapeutic intervention in order to improve on the following deficits Improper body mechanics;Increased muscle spasms;Decreased strength;Postural dysfunction;Decreased mobility;Decreased activity tolerance;Impaired flexibility;Hypermobility;Decreased range of motion;Pain;Hypomobility;Decreased coordination;Decreased endurance;Decreased scar mobility;Increased fascial restricitons   Rehab Potential Good   Clinical Impairments Affecting Rehab Potential abdominal hysterectomy    PT Frequency 1x / week   PT Duration 12 weeks   PT Treatment/Interventions ADLs/Self Care Home Management;Biofeedback;Cryotherapy;Electrical Stimulation;Moist Heat;Patient/family education;Neuromuscular re-education;Therapeutic exercise;Therapeutic activities;Stair training;Gait training;Functional mobility training;Manual techniques;Scar mobilization;Passive range of motion;Dry needling;Energy conservation;Taping;Balance training   Consulted and Agree with Plan of Care Patient         Problem List Patient Active Problem List   Diagnosis Date Noted  . Essential (primary) hypertension 06/21/2015  . Combined fat and carbohydrate induced hyperlipemia 06/21/2015  . Beat, premature ventricular 06/21/2015  . Urinary frequency 03/15/2015  . Strangury 03/15/2015  . Incontinence 03/15/2015  . Right flank pain 03/15/2015  . Atrophic vaginitis 03/15/2015  . Abnormal thyroid function test 01/24/2015  . IFG (impaired fasting glucose) 01/24/2015  . Overweight (BMI 25.0-29.9) 01/19/2015  . Urinary retention with incomplete bladder emptying 01/19/2015  . Hypertension   . Hyperlipidemia   . Osteoarthritis    . GERD (gastroesophageal reflux disease)   . Fibromyalgia   . Diverticulitis of colon 07/07/2009    Jerl Mina  ,PT, DPT, E-RYT  07/12/2015, 8:06 AM  [place] MAIN [facility] SERVICES [address] [place], [place], [address] Phone: [phone]   Fax:  [phone]  Name: Whitney Cooper MRN: [id number] Date of Birth: 08-17-1960

## 2015-07-11 NOTE — Patient Instructions (Signed)
   Log rolling sheet

## 2015-07-12 ENCOUNTER — Ambulatory Visit: Payer: 59 | Admitting: Physical Therapy

## 2015-07-12 DIAGNOSIS — R279 Unspecified lack of coordination: Secondary | ICD-10-CM

## 2015-07-12 DIAGNOSIS — M629 Disorder of muscle, unspecified: Secondary | ICD-10-CM

## 2015-07-12 NOTE — Addendum Note (Signed)
Addended by: Jerl Mina on: 07/12/2015 08:07 AM   Modules accepted: Orders

## 2015-07-12 NOTE — Addendum Note (Signed)
Addended by: Jerl Mina on: 07/12/2015 12:16 AM   Modules accepted: Orders

## 2015-07-12 NOTE — Therapy (Addendum)
[place] MAIN [facility] SERVICES [address] [place], [place], [address] Phone: [phone]   Fax:  [phone]  Physical Therapy Treatment  Patient Details  Name: Whitney Cooper MRN: [id number] Date of Birth: 11-23-1960 No Data Recorded  Encounter Date: 07/12/2015      PT End of Session - 07/12/15 1059    Visit Number 2   Number of Visits 12   Date for PT Re-Evaluation 09/30/15   PT Start Time 0805   PT Stop Time 0900   PT Time Calculation (min) 55 min   Activity Tolerance Patient tolerated treatment well;No increased pain   Behavior During Therapy [facility] for tasks assessed/performed      Past Medical History  Diagnosis Date  . Hypertension   . Hyperlipidemia   . Osteoarthritis   . GERD (gastroesophageal reflux disease)   . Fibromyalgia   . Recurrent UTI   . OAB (overactive bladder)   . History of kidney stones     Past Surgical History  Procedure Laterality Date  . Total abdominal hysterectomy  Jan 2012    Complete, due to heavy bleeding and grape-fruit size fibroid     There were no vitals filed for this visit.  Visit Diagnosis:  Lack of coordination  Fascial defect      Subjective Assessment - 07/12/15 0810    Subjective Pt reported she has been practicing diaphragmatic breathing and log rolling.    Pertinent History Hx of complete hysterectomy, 2012, planned weight loss 17 lbs with plant based diet, decreased sugars,             [facility] PT Assessment - 07/12/15 1053    Observation/Other Assessments   Other Surveys  --  PFDI 68% and ZUNG anxiety 59%                   Pelvic Floor Special Questions - 07/12/15 1054    Pelvic Floor Internal Exam pt verbally consented without contraindications     Exam Type Vaginal   Palpation no mm tensions/tenderness noted  bladder minor dorsal position, more cephalad w/ contractions   Strength fair squeeze, definite lift   Strength # of reps 4   Strength # of  seconds 10   Biofeedback able to demo ROM, 5 quicks for Urge suppression technique            [facility] Adult PT Treatment/Exercise - 07/12/15 1053    Bed Mobility   Bed Mobility --  crunch method OOB, able to log roll   Posture/Postural Control   Posture/Postural Control --   Self-Care   Other Self-Care Comments  educated about relaxation techniques and use of Urge Suppression technique  educated downregulating nn system for urge/frequency   Neuro Re-ed    Neuro Re-ed Details  guided body scan, diaphragmatic breathing and pelvic floor coordination, quick contractions, visual imagery, Urge Suppression technique   Urge Suppression technique    Manual Therapy   Manual therapy comments light manual Tx, occiput and sacrum                 PT Education - 07/12/15 1054    Education provided Yes   Education Details HEP   Person(s) Educated Patient   Methods Explanation;Demonstration;Tactile cues;Verbal cues;Handout   Comprehension Returned demonstration;Verbalized understanding             PT Long Term Goals - 07/12/15 1554    PT LONG TERM GOAL #1   Title Pt will  report decreased frequency of urination from 2x/hr to 1x/ every 2 hr in order to compeltes tasks at work .   Time 12   Period Weeks   Status New   PT LONG TERM GOAL #2   Title Pt will report no leakage when she receives a hug from her son nor when she gets up from sitting.    Time 12   Period Weeks   Status New   PT LONG TERM GOAL #3   Title Pt will report decreased with initial penetration from 6/10 to < 3/10 pain 100% of the time.    Time 12   Period Weeks   Status New   PT LONG TERM GOAL #4   Title Pt will be compliant with relaxation techniques to better manage IBS in order to decrease diarrhea episodes from 1x/ mo to zero times a month.    Time 12   Period Weeks   Status New   PT LONG TERM GOAL #5   Title Pt will decrease her score on PFDI from 68% to < 50% in order to improve urogenital/ bowel  function.    Time 12   Period Weeks   Status New   Additional Long Term Goals   Additional Long Term Goals Yes   PT LONG TERM GOAL #6   Title Pt will decrease her ZUNG anxiety Scale from 59% to < 49% in order to show improved self-management of stress/ anxiety to minimize urgency and frequency.    Time 12   Period Weeks   Status New               Plan - 07/12/15 1059    Clinical Impression Statement Pt is progressing well with diaphragmatic breathing and pelvic floor ROM coordination/ quick contractions. Pt demo'd low endurance of pelvic floor muscles. Pt will continue to benefit from a biopsychosocial appraoch to address her stress/anxiety in order to better manage her urge and frequency Sx. Pt reported feeling "at peace" after practicing the body scan. Pt also demo'd Urge Suppression correctly. Plan to focus on pelvic floor strengthening at next session.    Pt will benefit from skilled therapeutic intervention in order to improve on the following deficits Improper body mechanics;Increased muscle spasms;Decreased strength;Postural dysfunction;Decreased mobility;Decreased activity tolerance;Impaired flexibility;Hypermobility;Decreased range of motion;Pain;Hypomobility;Decreased coordination;Decreased endurance;Decreased scar mobility;Increased fascial restricitons   Rehab Potential Good   Clinical Impairments Affecting Rehab Potential abdominal hysterectomy    PT Frequency 1x / week   PT Duration 12 weeks   PT Treatment/Interventions ADLs/Self Care Home Management;Biofeedback;Cryotherapy;Electrical Stimulation;Moist Heat;Patient/family education;Neuromuscular re-education;Therapeutic exercise;Therapeutic activities;Stair training;Gait training;Functional mobility training;Manual techniques;Scar mobilization;Passive range of motion;Dry needling;Energy conservation;Taping;Balance training   Consulted and Agree with Plan of Care Patient        Problem List Patient Active Problem List    Diagnosis Date Noted  . Essential (primary) hypertension 06/21/2015  . Combined fat and carbohydrate induced hyperlipemia 06/21/2015  . Beat, premature ventricular 06/21/2015  . Urinary frequency 03/15/2015  . Strangury 03/15/2015  . Incontinence 03/15/2015  . Right flank pain 03/15/2015  . Atrophic vaginitis 03/15/2015  . Abnormal thyroid function test 01/24/2015  . IFG (impaired fasting glucose) 01/24/2015  . Overweight (BMI 25.0-29.9) 01/19/2015  . Urinary retention with incomplete bladder emptying 01/19/2015  . Hypertension   . Hyperlipidemia   . Osteoarthritis   . GERD (gastroesophageal reflux disease)   . Fibromyalgia   . Diverticulitis of colon 07/07/2009    Jerl Mina ,PT, DPT, E-RYT  07/12/2015, 3:55 PM  [place] MAIN [facility] SERVICES [address] [place], [place], [address] Phone: [phone]   Fax:  [phone]  Name: Whitney Cooper MRN: [id number] Date of Birth: [birth year]

## 2015-07-12 NOTE — Patient Instructions (Signed)
URGE SUPPRESION TECHNIQUES  ? These techniques are to be used to suppress those abnormally strong URGES to urinate, especially after you have already urinated < 1hr ago.  ? These steps do not have to be followed in order, and not all steps have to be used.   ? The purpose of these steps is to help you regain control of your bladder, to reduce the amount of urinary urgency, frequency, or leaking.  ? They take practice to master in controlling urgency.  Allow yourself to be okay with leaking when you first start practicing these steps. ? Practice these steps first at home, when you do not have to worry as much about leaking.  1. SIT DOWN.  Pressure on the pelvic floor inhibits the bladder.  Further pressure may help, such as sitting on a small rolled up towel. 2. LIGHT "KEGELS" using elevator imagery.  Breathe in, feel pelvic floor lower to "basement" level by the end of the inhalation. Exhale, feel pelvic floor gradually move up to "1st floor" which is the neutral position of pelvic floor. At the end of exhalation, feel pelvic floor lift higher at which you will perform a quick light squeeze (the muscles that hold back gas and urine).  Do not do hard, maximal contractions, as this will quickly fatigue the muscles and cause leakage. Perform this 5 repetitions in this pattern.  3. BREATHE & STAY CALM.  Breathing slowly and remaining calm will inhibit your sympathetic nervous system, which will in turn calm the bladder.   4. DISTRACTION.  Sit with a project that will engage your mind.  Anything that works for you - reading, word puzzles, crochet, knitting, checking email, balancing the checkbook, and so on. 5. VISUALIZATION.  Imagine that you are in a place/situation in which either you cannot or do not want to leave.  Examples: in a car and cannot stop; lying on a beach with a far walk to a restroom; at dinner with someone special.  If the urge persists after practicing these steps and feel you must go to  the bathroom, then it is imperative that you . . . . ? Walk slowly and calmly to the bathroom ? Maintain calm breathing ? Refrain from undressing until you are standing over the toilet Rushing to the restroom will only encourage the strong bladder urges and leaking.  Again, the more you practice, the easier these steps will become.  _________________________________________________________  Body scan at night 3 cycles

## 2015-07-26 ENCOUNTER — Ambulatory Visit: Payer: 59 | Attending: Urology | Admitting: Physical Therapy

## 2015-07-26 DIAGNOSIS — M629 Disorder of muscle, unspecified: Secondary | ICD-10-CM | POA: Diagnosis present

## 2015-07-26 DIAGNOSIS — R279 Unspecified lack of coordination: Secondary | ICD-10-CM | POA: Diagnosis not present

## 2015-07-26 NOTE — Patient Instructions (Addendum)
PELVIC FLOOR / KEGEL EXERCISES   Pelvic floor/ Kegel exercises are used to strengthen the muscles in the base of your pelvis that are responsible for supporting your pelvic organs and preventing urine/feces leakage. Based on your therapist's recommendations, they can be performed while standing, sitting, or lying down. Imagine pelvic floor area as a diamond with pelvic landmarks: top =pubic bone, bottom tip=tailbone, sides=sitting bones (ischial tuberosities).    Make yourself aware of this muscle group by using these cues while coordinating your breath:  Inhale, feel pelvic floor diamond area lower like hammock towards your feet and ribcage/belly expanding. Pause. Let the exhale naturally and feel your belly sink, abdominal muscles hugging in around you and you may notice the pelvic diamond draws upward towards your head forming a umbrella shape. Give a squeeze during the exhalation like you are stopping the flow of urine. If you are squeezing the buttock muscles, try to give 50% less effort.   Common Errors:  Breath holding: If you are holding your breath, you may be bearing down against your bladder instead of pulling it up. If you belly bulges up while you are squeezing, you are holding your breath. Be sure to breathe gently in and out while exercising. Counting out loud may help you avoid holding your breath.  Accessory muscle use: You should not see or feel other muscle movement when performing pelvic floor exercises. When done properly, no one can tell that you are performing the exercises. Keep the buttocks, belly and inner thighs relaxed.  Overdoing it: Your muscles can fatigue and stop working for you if you over-exercise. You may actually leak more or feel soreness at the lower abdomen or rectum.  YOUR HOME EXERCISE PROGRAM  LONG HOLDS: Position: on back  Inhale and then exhale. Then squeeze the muscle and count aloud for 10 seconds. Rest with 4 long breaths. (Be sure to let belly  sink in with exhales and not push outward)  Perform 4 repetitions, 3 times/day                     DECREASE DOWNWARD PRESSURE ON  YOUR PELVIC FLOOR, ABDOMINAL, LOW BACK MUSCLES       PRESERVE YOUR PELVIC HEALTH LONG-TERM   ** SQUEEZE pelvic floor BEFORE YOUR SNEEZE, COUGH, LAUGH   ** EXHALE BEFORE YOU RISE AGAINST GRAVITY (lifting, sit to stand, from squat to stand)   ** LOG ROLL OUT OF BED INSTEAD OF CRUNCH/SIT-UP  _______________________________   Handout for spinal movements for stretches away/at desk  For decreasing muscle tensions and managing stress   ______________________________   [employer] toolkit:  Spinal stretches Body scan in laying down or seated  x3 cycle  Belly massage   _______________________________  Toileting in public restroom, full sitting not hovering

## 2015-07-27 NOTE — Therapy (Signed)
[place] MAIN [facility] SERVICES [address] [place], [place], [address] Phone: [phone]   Fax:  [phone]  Physical Therapy Treatment  Patient Details  Name: Whitney Cooper MRN: [id number] Date of Birth: 1961/05/19 No Data Recorded  Encounter Date: 07/26/2015      PT End of Session - 07/27/15 1027    Visit Number 3   Number of Visits 12   Date for PT Re-Evaluation 09/30/15   PT Start Time [id number]   PT Stop Time 1115   PT Time Calculation (min) 60 min   Activity Tolerance Patient tolerated treatment well;No increased pain   Behavior During Therapy [facility] for tasks assessed/performed      Past Medical History  Diagnosis Date  . Hypertension   . Hyperlipidemia   . Osteoarthritis   . GERD (gastroesophageal reflux disease)   . Fibromyalgia   . Recurrent UTI   . OAB (overactive bladder)   . History of kidney stones     Past Surgical History  Procedure Laterality Date  . Total abdominal hysterectomy  Jan 2012    Complete, due to heavy bleeding and grape-fruit size fibroid     There were no vitals filed for this visit.  Visit Diagnosis:  No diagnosis found.      Subjective Assessment - 07/26/15 1018    Subjective   Pt has noticed a decreased frequency in the afternoon.  Pt has found having her feet up on a stool has helped her to have better urinary control. Pt also stated she has had improvement with complete emptying. Pt reported she was able to make it through a movie with one trip to urinate. Pt has been stressed with family. Pt has tried abdominal massage 3 x the past week.  Pt stated she does squat [employer].   Pertinent History Hx of complete hysterectomy, 2012, planned weight loss 17 lbs with plant based diet, decreased sugars,             [facility] PT Assessment - 07/27/15 0800    Coordination   Gross Motor Movements are Fluid and Coordinated --  hip adducted in dyn stab level 2, required cues for align                   Pelvic Floor Special Questions - 07/27/15 0800    External Palpation 4 reps, 10 sec with cuing for technique without breathholing   through clothing            [place] Adult PT Treatment/Exercise - 07/27/15 0801    Self-Care   Other Self-Care Comments  relaxation technique with seated body scan, alternating thumb to finger, hand placement for reduction of stress, application of spinal ROM during work breaks to minimize mm tensions   toileting posture in public restrooms (no hovering/squat)    Neuro Re-ed    Neuro Re-ed Details  pelvic floor coordination, decrease breathholding   Exercises   Exercises --  see pt isntructions: spinal ROM/ stretches at work                 PT Education - 07/27/15 1026    Education provided Yes   Education Details HEP   Person(s) Educated Patient   Methods Demonstration;Explanation;Tactile cues;Verbal cues;Handout   Comprehension Verbalized understanding;Returned demonstration             PT Long Term Goals - 07/26/15 1019    PT LONG TERM GOAL #1   Title Pt  will  report decreased frequency of urination from 2x/hr to 1x/ every 2 hr in order to completes tasks at work .   Time 12   Period Weeks   Status Achieved   PT LONG TERM GOAL #2   Title Pt will report no leakage when she receives a hug from her son nor when she gets up from sitting.    Time 12   Period Weeks   Status On-going   PT LONG TERM GOAL #3   Title Pt will report decreased with initial penetration from 6/10 to < 3/10 pain 100% of the time.    Time 12   Period Weeks   Status On-going   PT LONG TERM GOAL #4   Title Pt will be compliant with relaxation techniques to better manage IBS in order to decrease diarrhea episodes from 1x/ mo to zero times a month.    Time 12   Period Weeks   Status On-going   PT LONG TERM GOAL #5   Title Pt will decrease her score on PFDI from 68% to < 50% in order to improve urogenital/ bowel function.    Time  12   Period Weeks   Status On-going   PT LONG TERM GOAL #6   Title Pt will decrease her ZUNG anxiety Scale from 59% to < 49% in order to show improved self-management of stress/ anxiety to minimize urgency and frequency.    Time 12   Period Weeks   Status On-going               Plan - 07/27/15 1027    Clinical Impression Statement Pt has achieved her goal with decreased urinary frequency and states she has less difficulty with initating urination. Pt has been able to sit through a movie with one trip to urinate. Pt required education on not squatting when using public toilets in order to optimize pelvic floor function.  Pt continues to benefit from relaxation training in order to minimize relapse of urinary urgency/frequency. Incorporated today spinal ROM and stretches for pt to perform at work to decrease mm tensions and stress. Progressed to pelvic floor endurance training and plan to progress to deep core strengthening at next visit. Pt will continue to benefit from skilled PT.    Pt will benefit from skilled therapeutic intervention in order to improve on the following deficits Improper body mechanics;Increased muscle spasms;Decreased strength;Postural dysfunction;Decreased mobility;Decreased activity tolerance;Impaired flexibility;Hypermobility;Decreased range of motion;Pain;Hypomobility;Decreased coordination;Decreased endurance;Decreased scar mobility;Increased fascial restricitons   Rehab Potential Good   Clinical Impairments Affecting Rehab Potential abdominal hysterectomy    PT Frequency 1x / week   PT Duration 12 weeks   PT Treatment/Interventions ADLs/Self Care Home Management;Biofeedback;Cryotherapy;Electrical Stimulation;Moist Heat;Patient/family education;Neuromuscular re-education;Therapeutic exercise;Therapeutic activities;Stair training;Gait training;Functional mobility training;Manual techniques;Scar mobilization;Passive range of motion;Dry needling;Energy  conservation;Taping;Balance training   Consulted and Agree with Plan of Care Patient        Problem List Patient Active Problem List   Diagnosis Date Noted  . Essential (primary) hypertension 06/21/2015  . Combined fat and carbohydrate induced hyperlipemia 06/21/2015  . Beat, premature ventricular 06/21/2015  . Urinary frequency 03/15/2015  . Strangury 03/15/2015  . Incontinence 03/15/2015  . Right flank pain 03/15/2015  . Atrophic vaginitis 03/15/2015  . Abnormal thyroid function test 01/24/2015  . IFG (impaired fasting glucose) 01/24/2015  . Overweight (BMI 25.0-29.9) 01/19/2015  . Urinary retention with incomplete bladder emptying 01/19/2015  . Hypertension   . Hyperlipidemia   . Osteoarthritis   . GERD (gastroesophageal  reflux disease)   . Fibromyalgia   . Diverticulitis of colon 07/07/2009    Jerl Mina ,PT, DPT, E-RYT  07/27/2015, 10:54 AM  [place] MAIN [facility] SERVICES [address] [place], [place], [address] Phone: [phone]   Fax:  [phone]  Name: Whitney Cooper MRN: [id number] Date of Birth: 1961/01/25

## 2015-08-01 ENCOUNTER — Ambulatory Visit: Payer: 59 | Admitting: Physical Therapy

## 2015-08-02 ENCOUNTER — Encounter: Payer: 59 | Admitting: Physical Therapy

## 2015-08-05 ENCOUNTER — Encounter: Payer: Self-pay | Admitting: Urology

## 2015-08-05 ENCOUNTER — Ambulatory Visit (INDEPENDENT_AMBULATORY_CARE_PROVIDER_SITE_OTHER): Payer: 59 | Admitting: Urology

## 2015-08-05 VITALS — BP 120/80 | HR 89 | Ht 63.5 in | Wt 155.5 lb

## 2015-08-05 DIAGNOSIS — R350 Frequency of micturition: Secondary | ICD-10-CM | POA: Diagnosis not present

## 2015-08-05 DIAGNOSIS — R32 Unspecified urinary incontinence: Secondary | ICD-10-CM

## 2015-08-05 LAB — MICROSCOPIC EXAMINATION: RBC, UA: NONE SEEN /hpf (ref 0–?)

## 2015-08-05 LAB — URINALYSIS, COMPLETE
BILIRUBIN UA: NEGATIVE
GLUCOSE, UA: NEGATIVE
Ketones, UA: NEGATIVE
LEUKOCYTES UA: NEGATIVE
Nitrite, UA: NEGATIVE
PROTEIN UA: NEGATIVE
RBC, UA: NEGATIVE
Specific Gravity, UA: 1.015 (ref 1.005–1.030)
UUROB: 0.2 mg/dL (ref 0.2–1.0)
pH, UA: 7 (ref 5.0–7.5)

## 2015-08-05 NOTE — Progress Notes (Signed)
08/05/2015 8:51 AM   Whitney Cooper 04-25-1961 [id number]  Referring provider: Arnetha Courser, MD [address] [place], [place] [address]  Chief Complaint  Patient presents with  . Follow-up    urinary frequency, mild incontinence    HPI:  Today the patient reports hourly frequency. She has difficulty sitting through 2 are moving. She gets up once or twice a night. Her symptoms of present for multiple years. She's not tried other medications other than what is listed above. She has no urge incontinence. She is mild stress incontinence not requiring a pad. She's had kidney stones but no previous GU surgery. She does not have pelvic pain. She is intermittent mild dyspareunia. She voids frequently because of urgency and fullness  The patient is here on Vesicare consult physical therapy. We are treating her mild stress incontinence urinary frequency and nighttime frequency. She may try Toviaz in the past     PMH: Past Medical History  Diagnosis Date  . Hypertension   . Hyperlipidemia   . Osteoarthritis   . GERD (gastroesophageal reflux disease)   . Fibromyalgia   . Recurrent UTI   . OAB (overactive bladder)   . History of kidney stones   . Beat, premature ventricular 06/21/2015  . Essential (primary) hypertension 06/21/2015  . Diverticulitis of colon 07/07/2009  . IFG (impaired fasting glucose) 01/24/2015    Surgical History: Past Surgical History  Procedure Laterality Date  . Total abdominal hysterectomy  Jan 2012    Complete, due to heavy bleeding and grape-fruit size fibroid     Home Medications:    Medication List       This list is accurate as of: 08/05/15  8:51 AM.  Always use your most recent med list.               amitriptyline 10 MG tablet  Commonly known as:  ELAVIL  Take 2 tablets (20 mg total) by mouth at bedtime.     atorvastatin 20 MG tablet  Commonly known as:  LIPITOR  Take 1 tablet (20 mg total) by mouth at bedtime.     Co Q-10 100 MG Caps  Take  by mouth daily.     estradiol 0.1 MG/GM vaginal cream  Commonly known as:  ESTRACE  Place 1 Applicatorful vaginally at bedtime.     metoprolol succinate 50 MG 24 hr tablet  Commonly known as:  TOPROL XL  Take 1 tablet (50 mg total) by mouth daily.     MULTI-VITAMINS Tabs  Take by mouth daily.     pantoprazole 40 MG tablet  Commonly known as:  PROTONIX  Take 40 mg by mouth daily.     solifenacin 5 MG tablet  Commonly known as:  VESICARE  Take 1 tablet (5 mg total) by mouth daily.     Vitamin D3 2000 units capsule  Take 2,000 Units by mouth daily.        Allergies:  Allergies  Allergen Reactions  . Codeine Other (See Comments)  . Sulfa Antibiotics Rash    Family History: Family History  Problem Relation Age of Onset  . Ovarian cancer Maternal Grandmother 66  . Cancer Maternal Grandmother   . Hypertension Mother   . Cancer Mother     skin  . Heart disease Father     CHF  . Hypertension Father   . Stroke Father     mini strokes  . Cancer Father     skin  . Heart  attack Father   . Heart attack Maternal Grandfather   . Heart attack Paternal Grandfather   . Diabetes Maternal Uncle   . Thyroid disease Sister     Social History:  reports that she has never smoked. She has never used smokeless tobacco. She reports that she does not drink alcohol or use illicit drugs.  ROS: UROLOGY Frequent Urination?: Yes Hard to postpone urination?: No Burning/pain with urination?: No Get up at night to urinate?: Yes (once at bedtime) Leakage of urine?: No Urine stream starts and stops?: No Trouble starting stream?: No Do you have to strain to urinate?: No Blood in urine?: No Urinary tract infection?: No Sexually transmitted disease?: No Injury to kidneys or bladder?: No Painful intercourse?: No Weak stream?: No Currently pregnant?: No Vaginal bleeding?: No Last menstrual period?: 2011  Psychologic Depression?: No Anxiety?: Yes  Physical Exam: BP 120/80 mmHg   Pulse 89  Ht 5' 3.5" (1.613 m)  Wt 70.534 kg (155 lb 8 oz)  BMI 27.11 kg/m2   Laboratory Data: Lab Results  Component Value Date   WBC 8.7 01/19/2015   HGB 14.2 12/30/2013   HCT 40.8 01/19/2015   MCV 85 01/19/2015   PLT 351 01/19/2015    Lab Results  Component Value Date   CREATININE 0.71 01/19/2015    No results found for: PSA  No results found for: TESTOSTERONE  No results found for: HGBA1C  Urinalysis    Component Value Date/Time   COLORURINE Colorless 12/30/2013 2325   APPEARANCEUR Clear 12/30/2013 2325   LABSPEC 1.004 12/30/2013 2325   PHURINE 6.0 12/30/2013 2325   GLUCOSEU Negative 06/21/2015 0840   GLUCOSEU Negative 12/30/2013 2325   HGBUR Negative 12/30/2013 2325   BILIRUBINUR Negative 06/21/2015 0840   BILIRUBINUR Negative 12/30/2013 2325   KETONESUR Negative 12/30/2013 2325   PROTEINUR Negative 12/30/2013 2325   NITRITE Negative 06/21/2015 0840   NITRITE Negative 12/30/2013 2325   LEUKOCYTESUR Trace* 06/21/2015 0840   LEUKOCYTESUR Negative 12/30/2013 2325    Pertinent Imaging: none  Assessment & Plan:  The patient is improving less frequency and urgency on physical therapy as a monotherapy. She doesn't want to take medication yet the Willis she needs to in the future. She is very pleased with physical therapy so far  1. Incontinence  2. Urinary frequency   plan: See on a when necessary basis. The patient starts Vesicare she'll make another appointment    Reece Packer, MD  [place] [address] [place], [place] [address] [phone]

## 2015-08-09 ENCOUNTER — Encounter: Payer: 59 | Admitting: Physical Therapy

## 2015-08-11 ENCOUNTER — Encounter: Payer: 59 | Admitting: Physical Therapy

## 2015-08-16 ENCOUNTER — Encounter: Payer: 59 | Admitting: Physical Therapy

## 2015-08-19 ENCOUNTER — Ambulatory Visit: Payer: 59 | Admitting: Physical Therapy

## 2015-08-19 DIAGNOSIS — R279 Unspecified lack of coordination: Secondary | ICD-10-CM | POA: Diagnosis not present

## 2015-08-19 DIAGNOSIS — M629 Disorder of muscle, unspecified: Secondary | ICD-10-CM

## 2015-08-19 NOTE — Therapy (Signed)
[place] MAIN [facility] SERVICES [address] [place], [place], [address] Phone: [phone]   Fax:  [phone]  Physical Therapy Treatment  Patient Details  Name: Whitney Cooper MRN: [id number] Date of Birth: 02/19/1961 No Data Recorded  Encounter Date: 08/19/2015      PT End of Session - 08/19/15 1312    Visit Number 4   Number of Visits 12   Date for PT Re-Evaluation 09/30/15   PT Start Time 0810   PT Stop Time 0900   PT Time Calculation (min) 50 min   Activity Tolerance Patient tolerated treatment well;No increased pain   Behavior During Therapy [facility] for tasks assessed/performed      Past Medical History  Diagnosis Date  . Hypertension   . Hyperlipidemia   . Osteoarthritis   . GERD (gastroesophageal reflux disease)   . Fibromyalgia   . Recurrent UTI   . OAB (overactive bladder)   . History of kidney stones   . Beat, premature ventricular 06/21/2015  . Essential (primary) hypertension 06/21/2015  . Diverticulitis of colon 07/07/2009  . IFG (impaired fasting glucose) 01/24/2015    Past Surgical History  Procedure Laterality Date  . Total abdominal hysterectomy  Jan 2012    Complete, due to heavy bleeding and grape-fruit size fibroid     There were no vitals filed for this visit.  Visit Diagnosis:  Lack of coordination  Fascial defect      Subjective Assessment - 08/19/15 0810    Subjective Pt reported she saw R. MacDiarmid and she expressed wanting to cotinue PT and hold off taking Vesicare. Pt reported that using the stool has helped alot. Pt had only one incident with leakage after toileting but she acknowledged she was rushing.    Pertinent History Hx of complete hysterectomy, 2012, planned weight loss 17 lbs with plant based diet, decreased sugars,             [facility] PT Assessment - 08/19/15 1308    Palpation   Palpation comment abdominal scar restrictions decreased. abdomen soft w/ slight stretch marks                    Pelvic Floor Special Questions - 08/19/15 1309    Pelvic Floor Internal Exam pt verbally consented without contraindications     Exam Type Vaginal   Palpation no mm tensions/tenderness noted  bladder in normal position   Strength good squeeze, good lift, able to hold agaisnt strong resistance   Strength # of reps 10   Strength # of seconds 10           [facility] Adult PT Treatment/Exercise - 08/19/15 1310    Self-Care   Other Self-Care Comments  abdominal massage for relaxation and deep sleep , discussed future visits to increased deep core strength to minimize relapse of Sx   Exercises   Exercises --  progressed pelvic floor endurance training to 10 reps, 10 se   Manual Therapy   Manual therapy comments abdominal massage and guided pt                       PT Long Term Goals - 07/26/15 1019    PT LONG TERM GOAL #1   Title Pt will  report decreased frequency of urination from 2x/hr to 1x/ every 2 hr in order to completes tasks at work .   Time 12   Period Weeks   Status Achieved  PT LONG TERM GOAL #2   Title Pt will report no leakage when she receives a hug from her son nor when she gets up from sitting.    Time 12   Period Weeks   Status On-going   PT LONG TERM GOAL #3   Title Pt will report decreased with initial penetration from 6/10 to < 3/10 pain 100% of the time.    Time 12   Period Weeks   Status On-going   PT LONG TERM GOAL #4   Title Pt will be compliant with relaxation techniques to better manage IBS in order to decrease diarrhea episodes from 1x/ mo to zero times a month.    Time 12   Period Weeks   Status On-going   PT LONG TERM GOAL #5   Title Pt will decrease her score on PFDI from 68% to < 50% in order to improve urogenital/ bowel function.    Time 12   Period Weeks   Status On-going   PT LONG TERM GOAL #6   Title Pt will decrease her ZUNG anxiety Scale from 59% to < 49% in order to show improved self-management  of stress/ anxiety to minimize urgency and frequency.    Time 12   Period Weeks   Status On-going               Plan - 08/19/15 1312    Clinical Impression Statement Pt is progressing well with better managed urinary frequency./ urgency Sx. Initiated abdominal massage today and progressed pelvic floor strengthening program. Plan to progress to deep core excercises at next session.    Pt will benefit from skilled therapeutic intervention in order to improve on the following deficits Improper body mechanics;Increased muscle spasms;Decreased strength;Postural dysfunction;Decreased mobility;Decreased activity tolerance;Impaired flexibility;Hypermobility;Decreased range of motion;Pain;Hypomobility;Decreased coordination;Decreased endurance;Decreased scar mobility;Increased fascial restricitons   Rehab Potential Good   Clinical Impairments Affecting Rehab Potential abdominal hysterectomy    PT Frequency 1x / week   PT Duration 12 weeks   PT Treatment/Interventions ADLs/Self Care Home Management;Biofeedback;Cryotherapy;Electrical Stimulation;Moist Heat;Patient/family education;Neuromuscular re-education;Therapeutic exercise;Therapeutic activities;Stair training;Gait training;Functional mobility training;Manual techniques;Scar mobilization;Passive range of motion;Dry needling;Energy conservation;Taping;Balance training   Consulted and Agree with Plan of Care Patient        Problem List Patient Active Problem List   Diagnosis Date Noted  . Essential (primary) hypertension 06/21/2015  . Combined fat and carbohydrate induced hyperlipemia 06/21/2015  . Beat, premature ventricular 06/21/2015  . Urinary frequency 03/15/2015  . Strangury 03/15/2015  . Incontinence 03/15/2015  . Right flank pain 03/15/2015  . Atrophic vaginitis 03/15/2015  . Abnormal thyroid function test 01/24/2015  . IFG (impaired fasting glucose) 01/24/2015  . Overweight (BMI 25.0-29.9) 01/19/2015  . Urinary retention  with incomplete bladder emptying 01/19/2015  . Hypertension   . Hyperlipidemia   . Osteoarthritis   . GERD (gastroesophageal reflux disease)   . Fibromyalgia   . Diverticulitis of colon 07/07/2009    Jerl Mina ,PT, DPT, E-RYT  08/19/2015, 1:14 PM  [place] MAIN [facility] SERVICES [address] [place], [place], [address] Phone: [phone]   Fax:  [phone]  Name: Whitney Cooper MRN: [id number] Date of Birth: 02-11-1961

## 2015-08-23 ENCOUNTER — Encounter: Payer: 59 | Admitting: Physical Therapy

## 2015-08-25 ENCOUNTER — Ambulatory Visit: Payer: 59 | Attending: Family Medicine | Admitting: Physical Therapy

## 2015-08-25 VITALS — BP 120/80

## 2015-08-25 DIAGNOSIS — R279 Unspecified lack of coordination: Secondary | ICD-10-CM | POA: Diagnosis not present

## 2015-08-25 DIAGNOSIS — M629 Disorder of muscle, unspecified: Secondary | ICD-10-CM | POA: Diagnosis present

## 2015-08-25 NOTE — Patient Instructions (Addendum)
You are now ready to begin training the deep core muscles system: diaphragm, transverse abdominis, pelvic floor . These muscles must work together as a team.           The key to these exercises to train the brain to coordinate the timing of these muscles and to have them turn on for long periods of time to hold you upright against gravity (especially important if you are on your feet all day).These muscles are postural muscles and play a role stabilizing your spine and bodyweight. By doing these repetitions slowly and correctly instead of doing crunches, you will achieve a flatter belly without a lower pooch. You are also placing your spine in a more neutral position and breathing properly which in turn, decreases your risk for problems related to your pelvic floor, abdominal, and low back such as pelvic organ prolapse, hernias, diastasis recti (separation of superficial muscles), disk herniations, spinal fractures. These exercises set a solid foundation for you to later progress to resistance/ strength training with therabands and weights and return to other typical fitness exercises with a stronger deeper core.    Do level 1-2 (level 1 w/ pillow under hips this week only)      Seated deep core breathing

## 2015-08-25 NOTE — Therapy (Signed)
[place] MAIN [facility] SERVICES [address] [place], [place], [address] Phone: [phone]   Fax:  [phone]  Physical Therapy Treatment  Patient Details  Name: Whitney Cooper MRN: [id number] Date of Birth: [birth year] No Data Recorded  Encounter Date: 08/25/2015      PT End of Session - 08/25/15 1000    Visit Number 5   Number of Visits 12   Date for PT Re-Evaluation 09/30/15   PT Start Time 0820   PT Stop Time [id number]   PT Time Calculation (min) 35 min   Activity Tolerance Patient tolerated treatment well;No increased pain   Behavior During Therapy [facility] for tasks assessed/performed      Past Medical History  Diagnosis Date  . Hypertension   . Hyperlipidemia   . Osteoarthritis   . GERD (gastroesophageal reflux disease)   . Fibromyalgia   . Recurrent UTI   . OAB (overactive bladder)   . History of kidney stones   . Beat, premature ventricular 06/21/2015  . Essential (primary) hypertension 06/21/2015  . Diverticulitis of colon 07/07/2009  . IFG (impaired fasting glucose) 01/24/2015    Past Surgical History  Procedure Laterality Date  . Total abdominal hysterectomy  Jan 2012    Complete, due to heavy bleeding and grape-fruit size fibroid     Filed Vitals:   08/25/15 1011  BP: 120/80    Visit Diagnosis:  Lack of coordination  Fascial defect      Subjective Assessment - 08/25/15 0923    Subjective Pt reported last week was very stessful for her. She took her BP at a drugstore and the top number was 180. Pt felt "overworked" and decided to take the day off today to get a mssage and to complete errands to assist with details related to the passing of her father. Pt had not practied the belly massage she learned last week.    Pertinent History Hx of complete hysterectomy, 2012, planned weight loss 17 lbs with plant based diet, decreased sugars,             [facility] PT Assessment - 08/25/15 0947    Observation/Other Assessments    Observations more erect posture, but with crossed legs , decreased upper trap syndrome   Coordination   Gross Motor Movements are Fluid and Coordinated --  significnatly improved diaphragmatic breathing, less chest                   Pelvic Floor Special Questions - 08/25/15 0947    Pelvic Floor Internal Exam pt verbally consented without contraindications     Exam Type Vaginal   Palpation bladder in more dorsal position in hooklying today. deferred pelvic floor endurance. propped hips with pillow for deep core 1, no pillow deepcore 2.    Strength weak squeeze, no lift  posterior > ant initially, 3/5 after Tx   Biofeedback neuro-reedu for optimal breathing to move pelvic floor muscles into more circumferental contraction , applied manual Tx over lateral edge of bladder             [facility] Adult PT Treatment/Exercise - 08/25/15 [id number]    Neuro Re-ed    Neuro Re-ed Details  breathing coordination w/ pelvic floor movement without squeezing. deep core 1 and 2  (20 reps),  seated deep core breathing  with tactile cues   paced breathing 1:1, 1:2.     Manual Therapy   Myofascial Release jostling and fascial releases along lateral borders of bladder  Internal Pelvic Floor thiele massage anterior mm bilat                 PT Education - 08/25/15 1000    Education provided Yes   Education Details HEP   Person(s) Educated Patient   Methods Explanation;Demonstration;Tactile cues;Verbal cues;Handout   Comprehension Returned demonstration;Verbalized understanding             PT Long Term Goals - 07/26/15 1019    PT LONG TERM GOAL #1   Title Pt will  report decreased frequency of urination from 2x/hr to 1x/ every 2 hr in order to completes tasks at work .   Time 12   Period Weeks   Status Achieved   PT LONG TERM GOAL #2   Title Pt will report no leakage when she receives a hug from her son nor when she gets up from sitting.    Time 12   Period Weeks   Status  On-going   PT LONG TERM GOAL #3   Title Pt will report decreased with initial penetration from 6/10 to < 3/10 pain 100% of the time.    Time 12   Period Weeks   Status On-going   PT LONG TERM GOAL #4   Title Pt will be compliant with relaxation techniques to better manage IBS in order to decrease diarrhea episodes from 1x/ mo to zero times a month.    Time 12   Period Weeks   Status On-going   PT LONG TERM GOAL #5   Title Pt will decrease her score on PFDI from 68% to < 50% in order to improve urogenital/ bowel function.    Time 12   Period Weeks   Status On-going   PT LONG TERM GOAL #6   Title Pt will decrease her ZUNG anxiety Scale from 59% to < 49% in order to show improved self-management of stress/ anxiety to minimize urgency and frequency.    Time 12   Period Weeks   Status On-going               Plan - 08/25/15 1002    Clinical Impression Statement Pt demo'd proper activation of pelvic floor mm after manual Tx and neuro re-edu which faciliated a more caudal postion of bladder. Withhold pelvic floor training this week due to lowered position of bladder and initated deep core exercises to faciliate more fascial tensigrity for a more normal bladder position via co-activation of  TrA/diaphragm and pelvic floor.  Pt is scheduled to come in 2 weeks to allow for pt to practice at home and to attend to her busy schedule.  Pt continues to benefit from skilled PT.  Pt was taught more de-stressing strategies with breathing practices to manage her stress. Pt showed normal BP measurement .    Pt will benefit from skilled therapeutic intervention in order to improve on the following deficits Improper body mechanics;Increased muscle spasms;Decreased strength;Postural dysfunction;Decreased mobility;Decreased activity tolerance;Impaired flexibility;Hypermobility;Decreased range of motion;Pain;Hypomobility;Decreased coordination;Decreased endurance;Decreased scar mobility;Increased fascial  restricitons   Rehab Potential Good   Clinical Impairments Affecting Rehab Potential abdominal hysterectomy    PT Frequency 1x / week   PT Duration 12 weeks   PT Treatment/Interventions ADLs/Self Care Home Management;Biofeedback;Cryotherapy;Electrical Stimulation;Moist Heat;Patient/family education;Neuromuscular re-education;Therapeutic exercise;Therapeutic activities;Stair training;Gait training;Functional mobility training;Manual techniques;Scar mobilization;Passive range of motion;Dry needling;Energy conservation;Taping;Balance training   Consulted and Agree with Plan of Care Patient        Problem List Patient Active Problem List   Diagnosis Date Noted  .  Essential (primary) hypertension 06/21/2015  . Combined fat and carbohydrate induced hyperlipemia 06/21/2015  . Beat, premature ventricular 06/21/2015  . Urinary frequency 03/15/2015  . Strangury 03/15/2015  . Incontinence 03/15/2015  . Right flank pain 03/15/2015  . Atrophic vaginitis 03/15/2015  . Abnormal thyroid function test 01/24/2015  . IFG (impaired fasting glucose) 01/24/2015  . Overweight (BMI 25.0-29.9) 01/19/2015  . Urinary retention with incomplete bladder emptying 01/19/2015  . Hypertension   . Hyperlipidemia   . Osteoarthritis   . GERD (gastroesophageal reflux disease)   . Fibromyalgia   . Diverticulitis of colon 07/07/2009    Jerl Mina ,PT, DPT, E-RYT  08/25/2015, 11:25 AM  [place] MAIN [facility] SERVICES [address] [place], [place], [address] Phone: [phone]   Fax:  [phone]  Name: Whitney Cooper MRN: [id number] Date of Birth: 24-Oct-1960

## 2015-09-06 ENCOUNTER — Encounter: Payer: Self-pay | Admitting: Unknown Physician Specialty

## 2015-09-06 ENCOUNTER — Ambulatory Visit (INDEPENDENT_AMBULATORY_CARE_PROVIDER_SITE_OTHER): Payer: 59 | Admitting: Unknown Physician Specialty

## 2015-09-06 VITALS — BP 128/83 | HR 108 | Temp 98.3°F | Ht 63.2 in | Wt 149.8 lb

## 2015-09-06 DIAGNOSIS — K529 Noninfective gastroenteritis and colitis, unspecified: Secondary | ICD-10-CM

## 2015-09-06 NOTE — Progress Notes (Signed)
BP 128/83 mmHg  Pulse 108  Temp(Src) 98.3 F (36.8 C)  Ht 5' 3.2" (1.605 m)  Wt 149 lb 12.8 oz (67.949 kg)  BMI 26.38 kg/m2  SpO2 98%   Subjective:    Patient ID: Whitney Cooper, female    DOB: [birth year], 55 y.o.   MRN: [id number]  HPI: Whitney Cooper is a 55 y.o. female  Chief Complaint  Patient presents with  . Diarrhea    pt states she has had diarrhea and vomiting that started Sunday night. States she has had a fever and body aches as well.    Patient 2 days ago started with diarrhea after eating some ice cream had previously been feeling just fine no blood in diarrhea at all. Had frequent diarrhea which about a day later was started with throwing up also. Patient ran a fever up to 101 patient's taking ibuprofen and Tylenol which helps some with fever today's done better with no diarrhea or vomiting feels really tired washed out. His been able to eat and keep down some crispits and Jell-O.  Relevant past medical, surgical, family and social history reviewed and updated as indicated. Interim medical history since our last visit reviewed. Allergies and medications reviewed and updated.  Review of Systems  Constitutional: Negative.   Respiratory: Negative.   Cardiovascular: Negative.     Per HPI unless specifically indicated above     Objective:    BP 128/83 mmHg  Pulse 108  Temp(Src) 98.3 F (36.8 C)  Ht 5' 3.2" (1.605 m)  Wt 149 lb 12.8 oz (67.949 kg)  BMI 26.38 kg/m2  SpO2 98%  Wt Readings from Last 3 Encounters:  09/06/15 149 lb 12.8 oz (67.949 kg)  08/05/15 155 lb 8 oz (70.534 kg)  06/21/15 156 lb 11.2 oz (71.079 kg)    Physical Exam  Constitutional: She is oriented to person, place, and time. She appears well-developed and well-nourished. No distress.  HENT:  Head: Normocephalic and atraumatic.  Right Ear: Hearing normal.  Left Ear: Hearing normal.  Nose: Nose normal.  Eyes: Conjunctivae and lids are normal. Right eye exhibits no discharge. Left eye  exhibits no discharge. No scleral icterus.  Cardiovascular: Normal rate, regular rhythm and normal heart sounds.   Pulmonary/Chest: Effort normal and breath sounds normal. No respiratory distress.  Abdominal: Soft. Bowel sounds are normal. She exhibits no distension and no mass. There is no tenderness. There is no rebound and no guarding.  Musculoskeletal: Normal range of motion.  Neurological: She is alert and oriented to person, place, and time.  Skin: Skin is intact. No rash noted.  Psychiatric: She has a normal mood and affect. Her speech is normal and behavior is normal. Judgment and thought content normal. Cognition and memory are normal.    Results for orders placed or performed in visit on 08/05/15  Microscopic Examination  Result Value Ref Range   WBC, UA 0-5 0 -  5 /hpf   RBC, UA None seen 0 -  2 /hpf   Epithelial Cells (non renal) 0-10 0 - 10 /hpf   Bacteria, UA Few (A) None seen/Few  Urinalysis, Complete  Result Value Ref Range   Specific Gravity, UA 1.015 1.005 - 1.030   pH, UA 7.0 5.0 - 7.5   Color, UA Yellow Yellow   Appearance Ur Clear Clear   Leukocytes, UA Negative Negative   Protein, UA Negative Negative/Trace   Glucose, UA Negative Negative   Ketones, UA Negative Negative   RBC,  UA Negative Negative   Bilirubin, UA Negative Negative   Urobilinogen, Ur 0.2 0.2 - 1.0 mg/dL   Nitrite, UA Negative Negative   Microscopic Examination See below:       Assessment & Plan:   Problem List Items Addressed This Visit    None    Visit Diagnoses    Gastroenteritis    -  Primary    Discussed care and treatment of gastroenteritis with nothing by mouth slowly refeeding discuss potential red flags in particular bleeding        Follow up plan: Return for Physical Exam, As scheduled.

## 2015-09-08 ENCOUNTER — Encounter: Payer: 59 | Admitting: Physical Therapy

## 2015-09-22 ENCOUNTER — Ambulatory Visit: Payer: 59 | Attending: Family Medicine | Admitting: Physical Therapy

## 2015-09-22 DIAGNOSIS — M629 Disorder of muscle, unspecified: Secondary | ICD-10-CM | POA: Diagnosis present

## 2015-09-22 DIAGNOSIS — R279 Unspecified lack of coordination: Secondary | ICD-10-CM | POA: Diagnosis present

## 2015-09-22 NOTE — Therapy (Signed)
[place] MAIN [facility] SERVICES [address] [place], [place], [address] Phone: [phone]   Fax:  [phone]  Physical Therapy Treatment  Patient Details  Name: Whitney Cooper MRN: [id number] Date of Birth: [birth year] No Data Recorded  Encounter Date: 09/22/2015      PT End of Session - 09/22/15 1009    Visit Number 6   Number of Visits 12   Date for PT Re-Evaluation 09/30/15   PT Start Time 0904   PT Stop Time 1000   PT Time Calculation (min) 56 min   Activity Tolerance Patient tolerated treatment well;No increased pain   Behavior During Therapy [facility] for tasks assessed/performed      Past Medical History  Diagnosis Date  . Hypertension   . Hyperlipidemia   . Osteoarthritis   . GERD (gastroesophageal reflux disease)   . Fibromyalgia   . Recurrent UTI   . OAB (overactive bladder)   . History of kidney stones   . Beat, premature ventricular 06/21/2015  . Essential (primary) hypertension 06/21/2015  . Diverticulitis of colon 07/07/2009  . IFG (impaired fasting glucose) 01/24/2015    Past Surgical History  Procedure Laterality Date  . Total abdominal hysterectomy  Jan 2012    Complete, due to heavy bleeding and grape-fruit size fibroid     There were no vitals filed for this visit.  Visit Diagnosis:  Lack of coordination  Fascial defect      Subjective Assessment - 09/22/15 0907    Subjective Pt reported she had been sick last week. Pt has noticed decreased frequency to 1x/ every 2 hours.    Pertinent History Hx of complete hysterectomy, 2012, planned weight loss 17 lbs with plant based diet, decreased sugars,             [facility] PT Assessment - 09/22/15 1008    Posture/Postural Control   Posture Comments static stance, narrow BOS, pronation bilaterally, hyperextended knees, genu varus    Strength   Overall Strength Comments hip abd R 3/5, L 4/5.  posterior sling (shoulder scaption in prone and opp hip ext with  increased lumbar lordosis   Ambulation/Gait   Gait Comments posterior COM, narrow BOS                   Pelvic Floor Special Questions - 09/22/15 1006    Pelvic Floor Internal Exam pt verbally consented without contraindications     Exam Type Vaginal   Palpation no pillow under hips needed. bladder in more normal position.    Strength good squeeze, good lift, able to hold agaisnt strong resistance   Strength # of reps 6   Strength # of seconds 10   Biofeedback 5 quick contractions                   PT Education - 09/22/15 1009    Education provided Yes   Education Details HEP   Person(s) Educated Patient   Methods Explanation;Demonstration;Tactile cues;Verbal cues;Handout   Comprehension Verbalized understanding;Returned demonstration             PT Long Term Goals - 09/22/15 3785    PT LONG TERM GOAL #1   Title Pt will  report decreased frequency of urination from 2x/hr to 1x/ every 2 hr in order to completes tasks at work .   Time 12   Period Weeks   Status Achieved   PT LONG TERM GOAL #2   Title Pt will report no  leakage when she receives a hug from her son nor when she gets up from sitting.    Time 12   Period Weeks   Status Partially Met   PT LONG TERM GOAL #3   Title Pt will report decreased pain with initial penetration from 6/10 to < 3/10 pain 100% of the time.     Time 12   Period Weeks   Status Achieved   PT LONG TERM GOAL #4   Title Pt will be compliant with relaxation techniques to better manage IBS in order to decrease diarrhea episodes from 1x/ mo to zero times a month.    Time 12   Period Weeks   Status Achieved   PT LONG TERM GOAL #5   Title Pt will decrease her score on PFDI from 68% to < 50% in order to improve urogenital/ bowel function. (09/22/15: 26%)    Time 12   Period Weeks   Status Achieved   Additional Long Term Goals   Additional Long Term Goals Yes   PT LONG TERM GOAL #6   Title Pt will decrease her ZUNG  anxiety Scale from 59% to < 49% in order to show improved self-management of stress/ anxiety to minimize urgency and frequency.  (09/22/15: 42% )    Time 12   Period Weeks   Status Achieved   PT LONG TERM GOAL #7   Title Pt will demo more upright alignment of thorax over pelvis with gait (correcting posterior thorax to pelvis)  in order to maintain optimal deep core function with gait.    Time 12   Period Weeks   Status New   PT LONG TERM GOAL #8   Title Pt will demo decreased hyperextension of the knees and increased arch lift with static stance in order improve standing posture.    Time 12   Period Weeks   Status New   PT LONG TERM GOAL  #9   TITLE Pt will demo inmprove lumbopelvic stability and 4/5 with shoulder scaption and opposite hip ext (thoracolumbar ) in order to improve posterior core mm for walking long distances.    Time 12   Period Weeks   Status New   PT LONG TERM GOAL  #10   TITLE Pt will demo increased strength with hip abd from R 3/5, L 4/5 to bil 5/5 for stairclimbing.    Time 12   Period Weeks   Status New               Plan - 09/22/15 1010    Clinical Impression Statement Pt has achieved 4/5 goals since [facility] and has 5 more new goals added related to gait and strength training. Pt showed a decrease in PFDI score from 68% to 26%, indicating significantly improved pelvic floor function. Pt has maintained normal urinary frequency duration of 1x/ every 2 hours for the past month.  Pt demo'd today significantly improved deep core mm coordination, pelvic floor strength in endurance and power, and normal bladder position with circumferential pelvic floor contraction with ability to lift muscles (Grade 4).  Pt shows hip weakness and deviations with gait and standing posture. Thus, pt would benefit from continued PT to build activation of pelvic floor muscle activation with lower kinetic chain and proper spinal alignment in gravity applied positions in walking,  stairclimbing, and standing.  A re-cert request for more PT sessions accompanies this note.  Thank you for your referral. Pt has remained compliant and motivated despite life situations  that occurred over the past months.     Pt will benefit from skilled therapeutic intervention in order to improve on the following deficits Improper body mechanics;Increased muscle spasms;Decreased strength;Postural dysfunction;Decreased mobility;Decreased activity tolerance;Impaired flexibility;Hypermobility;Decreased range of motion;Pain;Hypomobility;Decreased coordination;Decreased endurance;Decreased scar mobility;Increased fascial restricitons   Rehab Potential Poor   Clinical Impairments Affecting Rehab Potential abdominal hysterectomy    PT Frequency Other (comment)  every other week    PT Duration 12 weeks   PT Treatment/Interventions ADLs/Self Care Home Management;Aquatic Therapy. Biofeedback;Cryotherapy;Electrical Stimulation;Moist Heat;Patient/family education;Neuromuscular re-education;Therapeutic exercise;Therapeutic activities;Stair training;Gait training;Functional mobility training;Manual techniques;Scar mobilization;Passive range of motion;Dry needling;Energy conservation;Taping;Balance training   Consulted and Agree with Plan of Care Patient        Problem List Patient Active Problem List   Diagnosis Date Noted  . Essential (primary) hypertension 06/21/2015  . Combined fat and carbohydrate induced hyperlipemia 06/21/2015  . Beat, premature ventricular 06/21/2015  . Urinary frequency 03/15/2015  . Strangury 03/15/2015  . Incontinence 03/15/2015  . Right flank pain 03/15/2015  . Atrophic vaginitis 03/15/2015  . Abnormal thyroid function test 01/24/2015  . IFG (impaired fasting glucose) 01/24/2015  . Overweight (BMI 25.0-29.9) 01/19/2015  . Urinary retention with incomplete bladder emptying 01/19/2015  . Hypertension   . Hyperlipidemia   . Osteoarthritis   . GERD (gastroesophageal reflux  disease)   . Fibromyalgia   . Diverticulitis of colon 07/07/2009    Jerl Mina ,PT, DPT, E-RYT  09/22/2015, 10:17 AM  [place] MAIN [facility] SERVICES [address] [place], [place], [address] Phone: [phone]   Fax:  [phone]  Name: Whitney Cooper MRN: [id number] Date of Birth: 04/24/1961

## 2015-09-22 NOTE — Patient Instructions (Signed)
PELVIC FLOOR / KEGEL EXERCISES   Pelvic floor/ Kegel exercises are used to strengthen the muscles in the base of your pelvis that are responsible for supporting your pelvic organs and preventing urine/feces leakage. Based on your therapist's recommendations, they can be performed while standing, sitting, or lying down. Imagine pelvic floor area as a diamond with pelvic landmarks: top =pubic bone, bottom tip=tailbone, sides=sitting bones (ischial tuberosities).    Make yourself aware of this muscle group by using these cues while coordinating your breath:  Inhale, feel pelvic floor diamond area lower like hammock towards your feet and ribcage/belly expanding. Pause. Let the exhale naturally and feel your belly sink, abdominal muscles hugging in around you and you may notice the pelvic diamond draws upward towards your head forming a umbrella shape. Give a squeeze during the exhalation like you are stopping the flow of urine. If you are squeezing the buttock muscles, try to give 50% less effort.   Common Errors:  Breath holding: If you are holding your breath, you may be bearing down against your bladder instead of pulling it up. If you belly bulges up while you are squeezing, you are holding your breath. Be sure to breathe gently in and out while exercising. Counting out loud may help you avoid holding your breath.  Accessory muscle use: You should not see or feel other muscle movement when performing pelvic floor exercises. When done properly, no one can tell that you are performing the exercises. Keep the buttocks, belly and inner thighs relaxed.  Overdoing it: Your muscles can fatigue and stop working for you if you over-exercise. You may actually leak more or feel soreness at the lower abdomen or rectum.  YOUR HOME EXERCISE PROGRAM  LONG HOLDS: Position: on back  Inhale and then exhale. Then squeeze the muscle and count aloud for 10 seconds. Rest with three long breaths. (Be sure to let  belly sink in with exhales and not push outward)  Perform 6 repetitions, 3 times/day  SHORT HOLDS: Position: on back   Inhale and then exhale. Then squeeze the muscle.  (Be sure to let belly sink in with exhales and not push outward)  Perform 5 repetitions, 5  Times/day                      DECREASE DOWNWARD PRESSURE ON  YOUR PELVIC FLOOR, ABDOMINAL, LOW BACK MUSCLES       PRESERVE YOUR PELVIC HEALTH LONG-TERM   ** SQUEEZE pelvic floor BEFORE YOUR SNEEZE, COUGH, LAUGH   ** EXHALE BEFORE YOU RISE AGAINST GRAVITY (lifting, sit to stand, from squat to stand)   ** LOG ROLL OUT OF BED INSTEAD OF CRUNCH/SIT-UP    _____________________   Standing posture:  Unlock knees and sweep pressure across [place] towards pink toes to lift arch of feet   Walking: feet under hips    _____________________  [email or web address]   ( 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

## 2015-10-05 ENCOUNTER — Ambulatory Visit: Payer: 59 | Admitting: Physical Therapy

## 2015-10-05 DIAGNOSIS — M629 Disorder of muscle, unspecified: Secondary | ICD-10-CM

## 2015-10-05 DIAGNOSIS — R279 Unspecified lack of coordination: Secondary | ICD-10-CM | POA: Diagnosis not present

## 2015-10-05 NOTE — Therapy (Signed)
[place] MAIN [facility] SERVICES [address] [place], [place], [address] Phone: [phone]   Fax:  [phone]  Physical Therapy Treatment  Patient Details  Name: Whitney Cooper MRN: [id number] Date of Birth: [birth year] No Data Recorded  Encounter Date: 10/05/2015      PT End of Session - 10/05/15 1007    Visit Number 7   Number of Visits 12   Date for PT Re-Evaluation 11/23/15   PT Start Time 0904   PT Stop Time 1005   PT Time Calculation (min) 61 min   Activity Tolerance Patient tolerated treatment well;No increased pain   Behavior During Therapy [facility] for tasks assessed/performed      Past Medical History  Diagnosis Date  . Hypertension   . Hyperlipidemia   . Osteoarthritis   . GERD (gastroesophageal reflux disease)   . Fibromyalgia   . Recurrent UTI   . OAB (overactive bladder)   . History of kidney stones   . Beat, premature ventricular 06/21/2015  . Essential (primary) hypertension 06/21/2015  . Diverticulitis of colon 07/07/2009  . IFG (impaired fasting glucose) 01/24/2015    Past Surgical History  Procedure Laterality Date  . Total abdominal hysterectomy  Jan 2012    Complete, due to heavy bleeding and grape-fruit size fibroid     There were no vitals filed for this visit.  Visit Diagnosis:  Lack of coordination  Fascial defect      Subjective Assessment - 10/05/15 1000    Subjective Pt reported she has been performing her HEP. Pt would like to return to fitness and build up UE strength.    Pertinent History Hx of complete hysterectomy, 2012, planned weight loss 17 lbs with plant based diet, decreased sugars,             [facility] PT Assessment - 10/05/15 1001    Coordination   Gross Motor Movements are Fluid and Coordinated --  excessive cuing for shoulder depression, cervical retraction   Palpation   Spinal mobility increased hypomobility along upper T segments, (decreased post-Tx)    Palpation comment  upper trap overactivity bilaterally,                      [facility] Adult PT Treatment/Exercise - 10/05/15 1105    Neuro Re-ed    Neuro Re-ed Details  scapular propioception cues in new HEP    Exercises   Exercises --  see pt instructions                PT Education - 10/05/15 1007    Education provided Yes   Education Details HEP   Person(s) Educated Patient   Methods Explanation;Demonstration;Tactile cues;Verbal cues;Handout   Comprehension Returned demonstration;Verbalized understanding             PT Long Term Goals - 10/05/15 1116    PT LONG TERM GOAL #1   Title Pt will  report decreased frequency of urination from 2x/hr to 1x/ every 2 hr in order to completes tasks at work .   Time 12   Period Weeks   Status Achieved   PT LONG TERM GOAL #2   Title Pt will report no leakage when she receives a hug from her son nor when she gets up from sitting.    Time 12   Period Weeks   Status Partially Met   PT LONG TERM GOAL #3   Title Pt will report decreased pain with initial penetration  from 6/10 to < 3/10 pain 100% of the time.     Time 12   Period Weeks   Status Achieved   PT LONG TERM GOAL #4   Title Pt will be compliant with relaxation techniques to better manage IBS in order to decrease diarrhea episodes from 1x/ mo to zero times a month.    Time 12   Period Weeks   Status Achieved   PT LONG TERM GOAL #5   Title Pt will decrease her score on PFDI from 68% to < 50% in order to improve urogenital/ bowel function. (09/22/15: 26%)    Time 12   Period Weeks   Status Achieved   PT LONG TERM GOAL #6   Title Pt will decrease her ZUNG anxiety Scale from 59% to < 49% in order to show improved self-management of stress/ anxiety to minimize urgency and frequency.  (09/22/15: 42% )    Time 12   Period Weeks   Status Achieved   PT LONG TERM GOAL #7   Title Pt will demo more upright alignment of thorax over pelvis with gait (correcting posterior thorax to  pelvis)  in order to maintain optimal deep core function with gait.    Time 12   Period Weeks   Status On-going   PT LONG TERM GOAL #8   Title Pt will demo decreased hyperextension of the knees and increased arch lift with static stance in order improve standing posture.    Time 12   Period Weeks   Status On-going   PT LONG TERM GOAL  #9   TITLE Pt will demo inmprove lumbopelvic stability and 4/5 with shoulder scaption and opposite hip ext (thoracolumbar ) in order to improve posterior core mm for walking long distances.    Time 12   Period Weeks   Status On-going   PT LONG TERM GOAL  #10   TITLE Pt will demo increased strength with hip abd from R 3/5, L 4/5 to bil 5/5 for stairclimbing.    Time 12   Period Weeks   Status On-going               Plan - 10/05/15 1109    Clinical Impression Statement Pt demo'd improved postural stability from deep core exercises and is now progressing towards external core strengthening to improve forward posture. Pt required manual Tx and propioception training in addition to modifications to exercises due to poor motor control with shoulder depression/retraction  and cervical retraction (Hx of injury to L and neck tensions).  Pt will continue to benefit from skilled PT in order to return help pt address a safe progression towards fitness.      Pt will benefit from skilled therapeutic intervention in order to improve on the following deficits Improper body mechanics;Increased muscle spasms;Decreased strength;Postural dysfunction;Decreased mobility;Decreased activity tolerance;Impaired flexibility;Hypermobility;Decreased range of motion;Pain;Hypomobility;Decreased coordination;Decreased endurance;Decreased scar mobility;Increased fascial restricitons   Rehab Potential Good   Clinical Impairments Affecting Rehab Potential abdominal hysterectomy    PT Frequency Other (comment)   PT Duration 12 weeks   PT Treatment/Interventions ADLs/Self Care Home  Management;Biofeedback;Cryotherapy;Electrical Stimulation;Moist Heat;Patient/family education;Neuromuscular re-education;Therapeutic exercise;Therapeutic activities;Stair training;Gait training;Functional mobility training;Manual techniques;Scar mobilization;Passive range of motion;Dry needling;Energy conservation;Taping;Balance training;Aquatic Therapy   Consulted and Agree with Plan of Care Patient        Problem List Patient Active Problem List   Diagnosis Date Noted  . Essential (primary) hypertension 06/21/2015  . Combined fat and carbohydrate induced hyperlipemia 06/21/2015  . Beat, premature ventricular  06/21/2015  . Urinary frequency 03/15/2015  . Strangury 03/15/2015  . Incontinence 03/15/2015  . Right flank pain 03/15/2015  . Atrophic vaginitis 03/15/2015  . Abnormal thyroid function test 01/24/2015  . IFG (impaired fasting glucose) 01/24/2015  . Overweight (BMI 25.0-29.9) 01/19/2015  . Urinary retention with incomplete bladder emptying 01/19/2015  . Hypertension   . Hyperlipidemia   . Osteoarthritis   . GERD (gastroesophageal reflux disease)   . Fibromyalgia   . Diverticulitis of colon 07/07/2009    Jerl Mina ,PT, DPT, E-RYT  10/05/2015, 11:17 AM  [place] MAIN [facility] SERVICES [address] [place], [place], [address] Phone: [phone]   Fax:  [phone]  Name: Whitney Cooper MRN: [id number] Date of Birth: 05/29/1961

## 2015-10-05 NOTE — Patient Instructions (Signed)
Upper trap stretch (handout)   Strengthening: (chin tuck, shoulders lowered away from ears and holding "imaginary" pencil under armpits  "w" while on back   With yellow band   10 x 2  / day          Bridging with elbows bent (upper arms stable), knees hip width apart. Heels pressed onto a ottoman/ stool, etc . Band on other side of doorknob  10 x 2  / day

## 2015-10-06 ENCOUNTER — Encounter: Payer: 59 | Admitting: Physical Therapy

## 2015-10-19 ENCOUNTER — Encounter: Payer: 59 | Admitting: Physical Therapy

## 2015-10-20 ENCOUNTER — Encounter: Payer: 59 | Admitting: Physical Therapy

## 2015-10-21 ENCOUNTER — Ambulatory Visit: Payer: 59 | Admitting: Physical Therapy

## 2015-10-21 DIAGNOSIS — R279 Unspecified lack of coordination: Secondary | ICD-10-CM | POA: Diagnosis not present

## 2015-10-21 DIAGNOSIS — M629 Disorder of muscle, unspecified: Secondary | ICD-10-CM

## 2015-10-21 NOTE — Patient Instructions (Addendum)
Bouncing on pilates with 5 sec holds of pelvic floor and then  5 sec rest break.   X 5 reps , 3 x day   _____ Yoga:  Mountain pose chair pose with arms overhead in a "v" Step back, arms behind, hinge at hips [place] pose  Mountain pose

## 2015-10-21 NOTE — Therapy (Addendum)
[place] MAIN [facility] SERVICES [address] [place], [place], [address] Phone: [phone]   Fax:  [phone]  Physical Therapy Treatment  Patient Details  Name: Whitney Cooper MRN: [id number] Date of Birth: 1960-08-29 No Data Recorded  Encounter Date: 10/21/2015      PT End of Session - 10/21/15 2053    Visit Number 8   Number of Visits 12   Date for PT Re-Evaluation 11/23/15   PT Start Time 1110   PT Stop Time 1215   PT Time Calculation (min) 65 min      Past Medical History  Diagnosis Date  . Hypertension   . Hyperlipidemia   . Osteoarthritis   . GERD (gastroesophageal reflux disease)   . Fibromyalgia   . Recurrent UTI   . OAB (overactive bladder)   . History of kidney stones   . Beat, premature ventricular 06/21/2015  . Essential (primary) hypertension 06/21/2015  . Diverticulitis of colon 07/07/2009  . IFG (impaired fasting glucose) 01/24/2015    Past Surgical History  Procedure Laterality Date  . Total abdominal hysterectomy  Jan 2012    Complete, due to heavy bleeding and grape-fruit size fibroid     There were no vitals filed for this visit.  Visit Diagnosis:  Lack of coordination  Fascial defect      Subjective Assessment - 10/21/15 1111    Subjective Pt reports she has been walking and riding the airdyne bike. Pt only had leakage with jogging. Pt's was able to walk 2 miles without leakage and had no leakage when her son gave her a tight hug. These are improvements. Pt stated she wanted to try yoga.     Pertinent History Hx of complete hysterectomy, 2012, planned weight loss 17 lbs with plant based diet, decreased sugars,             [facility] PT Assessment - 10/21/15 2049    Coordination   Gross Motor Movements are Fluid and Coordinated --  lumbar lordosis in yoga postures,   Fine Motor Movements are Fluid and Coordinated --  difficulty w/ scap depression/retraction in yoga poses                      [facility] Adult PT Treatment/Exercise - 10/21/15 2049    Self-Care   Other Self-Care Comments  explained svasana, modifications to yoga to decrease upper trap overactivity.    Therapeutic Activites    Therapeutic Activities --  3 sets pelvic floor,5 sec hold,  bouncing on pilates ball   Neuro Re-ed    Neuro Re-ed Details  scapular depression, retraction, pelvic neutral, lower kinetic chain activation in feet during three yoga poses ( chair , mountain, warrior one w / shoulde extended).                 PT Long Term Goals - 10/21/15 1112    PT LONG TERM GOAL #1   Title Pt will  report decreased frequency of urination from 2x/hr to 1x/ every 2 hr in order to completes tasks at work .   Time 12   Period Weeks   Status Achieved   PT LONG TERM GOAL #2   Title Pt will report no leakage when she receives a hug from her son nor when she gets up from sitting.    Time 12   Period Weeks   Status Achieved   PT LONG TERM GOAL #3   Title Pt will report  decreased pain with initial penetration from 6/10 to < 3/10 pain 100% of the time.     Time 12   Period Weeks   Status Achieved   PT LONG TERM GOAL #4   Title Pt will be compliant with relaxation techniques to better manage IBS in order to decrease diarrhea episodes from 1x/ mo to zero times a month.    Time 12   Period Weeks   Status Achieved   PT LONG TERM GOAL #5   Title Pt will decrease her score on PFDI from 68% to < 50% in order to improve urogenital/ bowel function. (09/22/15: 26%)    Time 12   Period Weeks   Status Achieved   PT LONG TERM GOAL #6   Title Pt will decrease her ZUNG anxiety Scale from 59% to < 49% in order to show improved self-management of stress/ anxiety to minimize urgency and frequency.  (09/22/15: 42% )    Time 12   Period Weeks   Status Achieved   PT LONG TERM GOAL #7   Title Pt will demo more upright alignment of thorax over pelvis with gait (correcting posterior thorax to  pelvis)  in order to maintain optimal deep core function with gait.    Time 12   Period Weeks   Status Achieved   PT LONG TERM GOAL #8   Title Pt will demo decreased hyperextension of the knees and increased arch lift with static stance in order improve standing posture.    Time 12   Period Weeks   Status Achieved   PT LONG TERM GOAL  #9   TITLE Pt will demo inmprove lumbopelvic stability and 4/5 with shoulder scaption and opposite hip ext (thoracolumbar ) in order to improve posterior core mm for walking long distances.    Time 12   Period Weeks   Status On-going   PT LONG TERM GOAL  #10   TITLE Pt will demo increased strength with hip abd from R 3/5, L 4/5 to bil 5/5 for stairclimbing.    Time 12   Period Weeks   Status On-going   PT LONG TERM GOAL  #11   TITLE Pt will report no leakage with bouncing on the pilates ball for 30 sec x 3 sets without leakage in order to return to jogging/ running.   Time 12   Period Weeks   Status New                 Plan - 10/21/15 1116    Clinical Impression Statement  Pt has achieved 8 out 11 goals and is progressing well towards her remaining goals. Pt will continue to benefit from skilled PT to help pt return to fitness with decreased risk for pelvic floor dysfunction.    Pt will benefit from skilled therapeutic intervention in order to improve on the following deficits Improper body mechanics;Increased muscle spasms;Decreased strength;Postural dysfunction;Decreased mobility;Decreased activity tolerance;Impaired flexibility;Hypermobility;Decreased range of motion;Pain;Hypomobility;Decreased coordination;Decreased endurance;Decreased scar mobility;Increased fascial restricitons   Rehab Potential Good   Clinical Impairments Affecting Rehab Potential abdominal hysterectomy    PT Frequency Other (comment)   PT Duration 12 weeks   PT Treatment/Interventions ADLs/Self Care Home Management;Biofeedback;Cryotherapy;Electrical Stimulation;Moist  Heat;Patient/family education;Neuromuscular re-education;Therapeutic exercise;Therapeutic activities;Stair training;Gait training;Functional mobility training;Manual techniques;Scar mobilization;Passive range of motion;Dry needling;Energy conservation;Taping;Balance training;Aquatic Therapy   Consulted and Agree with Plan of Care Patient        Problem List Patient Active Problem List   Diagnosis Date Noted  . Essential (primary)  hypertension 06/21/2015  . Combined fat and carbohydrate induced hyperlipemia 06/21/2015  . Beat, premature ventricular 06/21/2015  . Urinary frequency 03/15/2015  . Strangury 03/15/2015  . Incontinence 03/15/2015  . Right flank pain 03/15/2015  . Atrophic vaginitis 03/15/2015  . Abnormal thyroid function test 01/24/2015  . IFG (impaired fasting glucose) 01/24/2015  . Overweight (BMI 25.0-29.9) 01/19/2015  . Urinary retention with incomplete bladder emptying 01/19/2015  . Hypertension   . Hyperlipidemia   . Osteoarthritis   . GERD (gastroesophageal reflux disease)   . Fibromyalgia   . Diverticulitis of colon 07/07/2009    Jerl Mina  ,PT, DPT, E-RYT  10/21/2015, 8:59 PM  [place] MAIN [facility] SERVICES [address] [place], [place], [address] Phone: [phone]   Fax:  [phone]  Name: QUAN SIERRA MRN: [id number] Date of Birth: 01-10-1961

## 2015-11-02 ENCOUNTER — Ambulatory Visit: Payer: 59 | Admitting: Physical Therapy

## 2015-11-09 ENCOUNTER — Ambulatory Visit: Payer: 59 | Attending: Family Medicine | Admitting: Physical Therapy

## 2015-11-09 DIAGNOSIS — R279 Unspecified lack of coordination: Secondary | ICD-10-CM | POA: Diagnosis not present

## 2015-11-09 NOTE — Patient Instructions (Signed)
Bouncing on ball with breathing practice (exhale with pelvic floor lifted, inhale, relax pelvic floor)      Hip thrusts on the ball (dig elbows in to the ball, keep them by your trunk)   5 and rest x 3 sets      Plank( push four corners down through palms , hug armpit muscles down, draw heels back to lift lowback up)  10 sec holds, x 3    Stretch for mid back: [place]  Fore arm braid (interlace hands)     Running with trunk stacked over leading leg

## 2015-11-10 ENCOUNTER — Encounter: Payer: Self-pay | Admitting: *Deleted

## 2015-11-11 ENCOUNTER — Encounter
Admission: RE | Disposition: A | Discharge status: Home / Self Care | Payer: Self-pay | Source: Ambulatory Visit | Attending: Gastroenterology

## 2015-11-11 ENCOUNTER — Encounter: Payer: Self-pay | Admitting: *Deleted

## 2015-11-11 ENCOUNTER — Ambulatory Visit: Payer: 59 | Admitting: Anesthesiology

## 2015-11-11 ENCOUNTER — Ambulatory Visit
Admission: RE | Admit: 2015-11-11 | Discharge: 2015-11-11 | Disposition: A | Discharge status: Home / Self Care | Payer: 59 | Source: Ambulatory Visit | Attending: Gastroenterology | Admitting: Gastroenterology

## 2015-11-11 DIAGNOSIS — I454 Nonspecific intraventricular block: Secondary | ICD-10-CM | POA: Insufficient documentation

## 2015-11-11 DIAGNOSIS — Z79899 Other long term (current) drug therapy: Secondary | ICD-10-CM | POA: Insufficient documentation

## 2015-11-11 DIAGNOSIS — M1990 Unspecified osteoarthritis, unspecified site: Secondary | ICD-10-CM | POA: Insufficient documentation

## 2015-11-11 DIAGNOSIS — I10 Essential (primary) hypertension: Secondary | ICD-10-CM | POA: Diagnosis not present

## 2015-11-11 DIAGNOSIS — M797 Fibromyalgia: Secondary | ICD-10-CM | POA: Insufficient documentation

## 2015-11-11 DIAGNOSIS — K640 First degree hemorrhoids: Secondary | ICD-10-CM | POA: Diagnosis not present

## 2015-11-11 DIAGNOSIS — E785 Hyperlipidemia, unspecified: Secondary | ICD-10-CM | POA: Diagnosis not present

## 2015-11-11 DIAGNOSIS — N3281 Overactive bladder: Secondary | ICD-10-CM | POA: Diagnosis not present

## 2015-11-11 DIAGNOSIS — Z885 Allergy status to narcotic agent status: Secondary | ICD-10-CM | POA: Insufficient documentation

## 2015-11-11 DIAGNOSIS — I493 Ventricular premature depolarization: Secondary | ICD-10-CM | POA: Insufficient documentation

## 2015-11-11 DIAGNOSIS — Z86010 Personal history of colonic polyps: Secondary | ICD-10-CM | POA: Diagnosis not present

## 2015-11-11 DIAGNOSIS — K219 Gastro-esophageal reflux disease without esophagitis: Secondary | ICD-10-CM | POA: Insufficient documentation

## 2015-11-11 DIAGNOSIS — K5732 Diverticulitis of large intestine without perforation or abscess without bleeding: Secondary | ICD-10-CM | POA: Diagnosis not present

## 2015-11-11 DIAGNOSIS — Z87442 Personal history of urinary calculi: Secondary | ICD-10-CM | POA: Diagnosis not present

## 2015-11-11 DIAGNOSIS — Z882 Allergy status to sulfonamides status: Secondary | ICD-10-CM | POA: Insufficient documentation

## 2015-11-11 DIAGNOSIS — Z87440 Personal history of urinary (tract) infections: Secondary | ICD-10-CM | POA: Diagnosis not present

## 2015-11-11 HISTORY — PX: COLONOSCOPY WITH PROPOFOL: SHX5780

## 2015-11-11 HISTORY — DX: Nonspecific intraventricular block: I45.4

## 2015-11-11 SURGERY — COLONOSCOPY WITH PROPOFOL
Anesthesia: General

## 2015-11-11 MED ORDER — SODIUM CHLORIDE 0.9 % IV SOLN
INTRAVENOUS | Status: DC
  Administered 2015-11-11: 1000 mL via INTRAVENOUS
  Administered 2015-11-11: 08:00:00 via INTRAVENOUS

## 2015-11-11 MED ORDER — PROPOFOL 500 MG/50ML IV EMUL
INTRAVENOUS | Status: DC | PRN
  Administered 2015-11-11: 120 ug/kg/min via INTRAVENOUS

## 2015-11-11 MED ORDER — PROPOFOL 10 MG/ML IV BOLUS
INTRAVENOUS | Status: DC | PRN
  Administered 2015-11-11: 40 mg via INTRAVENOUS

## 2015-11-11 MED ORDER — FENTANYL CITRATE (PF) 100 MCG/2ML IJ SOLN
INTRAMUSCULAR | Status: DC | PRN
  Administered 2015-11-11: 50 ug via INTRAVENOUS

## 2015-11-11 MED ORDER — LIDOCAINE HCL (CARDIAC) 20 MG/ML IV SOLN
INTRAVENOUS | Status: DC | PRN
  Administered 2015-11-11: 40 mg via INTRAVENOUS

## 2015-11-11 MED ORDER — MIDAZOLAM HCL 5 MG/5ML IJ SOLN
INTRAMUSCULAR | Status: DC | PRN
  Administered 2015-11-11: 2 mg via INTRAVENOUS

## 2015-11-11 MED ORDER — SODIUM CHLORIDE 0.9 % IV SOLN: INTRAVENOUS | Status: DC

## 2015-11-11 NOTE — Therapy (Signed)
[place] MAIN [facility] SERVICES [address] [place], [place], [address] Phone: [phone]   Fax:  [phone]  Physical Therapy Treatment  Patient Details  Name: Whitney Cooper MRN: [id number] Date of Birth: [birth year] No Data Recorded  Encounter Date: 11/09/2015      PT End of Session - 11/11/15 0048    Visit Number 9   Date for PT Re-Evaluation 11/23/15   PT Start Time 1010   PT Stop Time 1100   PT Time Calculation (min) 50 min      Past Medical History  Diagnosis Date  . Hypertension   . Hyperlipidemia   . Osteoarthritis   . GERD (gastroesophageal reflux disease)   . Fibromyalgia   . Recurrent UTI   . OAB (overactive bladder)   . History of kidney stones   . Beat, premature ventricular 06/21/2015  . Essential (primary) hypertension 06/21/2015  . Diverticulitis of colon 07/07/2009  . IFG (impaired fasting glucose) 01/24/2015    Past Surgical History  Procedure Laterality Date  . Total abdominal hysterectomy  Jan 2012    Complete, due to heavy bleeding and grape-fruit size fibroid     There were no vitals filed for this visit.      Subjective Assessment - 11/11/15 0045    Subjective Pt reports she has been practicing her HEP and tries to keep up with her bicycling. Pt would like to try jogging/running.    Pertinent History Hx of complete hysterectomy, 2012, planned weight loss 17 lbs with plant based diet, decreased sugars,             [facility] PT Assessment - 11/11/15 0046    Ambulation/Gait   Gait Comments running mechanics: decreased COM over tibia with running                      [facility] Adult PT Treatment/Exercise - 11/11/15 0046    Therapeutic Activites    Therapeutic Activities --  see pt instructions   Neuro Re-ed    Neuro Re-ed Details  see pt instructions   Exercises   Exercises --  see pt instructions                PT Education - 11/11/15 0047    Education provided Yes   Education Details HEP   Person(s) Educated Patient   Methods Explanation;Demonstration;Tactile cues;Verbal cues;Handout   Comprehension Returned demonstration;Verbalized understanding             PT Long Term Goals - 11/11/15 0049    PT LONG TERM GOAL #1   Title Pt will  report decreased frequency of urination from 2x/hr to 1x/ every 2 hr in order to completes tasks at work .   Time 12   Period Weeks   Status Achieved   PT LONG TERM GOAL #2   Title Pt will report no leakage when she receives a hug from her son nor when she gets up from sitting.    Time 12   Period Weeks   Status Achieved   PT LONG TERM GOAL #3   Title Pt will report decreased pain with initial penetration from 6/10 to < 3/10 pain 100% of the time.     Time 12   Period Weeks   Status Achieved   PT LONG TERM GOAL #4   Title Pt will be compliant with relaxation techniques to better manage IBS in order to decrease diarrhea episodes from 1x/ mo to  zero times a month.    Time 12   Period Weeks   Status Achieved   PT LONG TERM GOAL #5   Title Pt will decrease her score on PFDI from 68% to < 50% in order to improve urogenital/ bowel function. (09/22/15: 26%)    Time 12   Period Weeks   Status Achieved   Additional Long Term Goals   Additional Long Term Goals Yes   PT LONG TERM GOAL #6   Title Pt will decrease her ZUNG anxiety Scale from 59% to < 49% in order to show improved self-management of stress/ anxiety to minimize urgency and frequency.  (09/22/15: 42% )    Period Days   Status Achieved   PT LONG TERM GOAL #7   Title Pt will demo more upright alignment of thorax over pelvis with gait (correcting posterior thorax to pelvis)  in order to maintain optimal deep core function with gait.    Time 12   Period Weeks   Status Achieved   PT LONG TERM GOAL #8   Title Pt will demo decreased hyperextension of the knees and increased arch lift with static stance in order improve standing posture.    Time 12   Period  Weeks   Status Achieved   PT LONG TERM GOAL  #9   TITLE Pt will demo inmprove lumbopelvic stability and 4/5 with shoulder scaption and opposite hip ext (thoracolumbar ) in order to improve posterior core mm for walking long distances.    Period Weeks   Status Achieved   PT LONG TERM GOAL  #10   TITLE Pt will demo increased strength with hip abd from R 3/5, L 4/5 to bil 5/5 for stairclimbing.    Time 12   Period Weeks   Status Achieved   PT LONG TERM GOAL  #11   TITLE Pt will report no leakage with bouncing on the pilates ball for 30 sec x 3 sets without leakage in order to return to jogging/ running.   Time 12   Period Weeks   Status Achieved   PT LONG TERM GOAL  #12   TITLE Pt will demo IND with running stretches and require no cuing for proper running mechanics in order to minimize risk for injuries.   Time 12   Period Weeks   Status New               Plan - 11/11/15 0048    Clinical Impression Statement Pt achieved 12/13 goals.  Pt continues to advance towards her goals but will required one more visit before d/c. Pt demo'd improved running gait mechanics after cuing. Plan to address running stretches to minimize hyperactivity of pelvic floor mm when pt incorporates running into her exercise routine.    Rehab Potential Good   Clinical Impairments Affecting Rehab Potential abdominal hysterectomy    PT Frequency Other (comment)   PT Duration 12 weeks   PT Treatment/Interventions ADLs/Self Care Home Management;Biofeedback;Cryotherapy;Electrical Stimulation;Moist Heat;Patient/family education;Neuromuscular re-education;Therapeutic exercise;Therapeutic activities;Stair training;Gait training;Functional mobility training;Manual techniques;Scar mobilization;Passive range of motion;Dry needling;Energy conservation;Taping;Balance training;Aquatic Therapy   Consulted and Agree with Plan of Care Patient      Patient will benefit from skilled therapeutic intervention in order to  improve the following deficits and impairments:  Improper body mechanics, Increased muscle spasms, Decreased strength, Postural dysfunction, Decreased mobility, Decreased activity tolerance, Impaired flexibility, Hypermobility, Decreased range of motion, Pain, Hypomobility, Decreased coordination, Decreased endurance, Decreased scar mobility, Increased fascial restricitons  Visit Diagnosis: Unspecified  lack of coordination     Problem List Patient Active Problem List   Diagnosis Date Noted  . Essential (primary) hypertension 06/21/2015  . Combined fat and carbohydrate induced hyperlipemia 06/21/2015  . Beat, premature ventricular 06/21/2015  . Urinary frequency 03/15/2015  . Strangury 03/15/2015  . Incontinence 03/15/2015  . Right flank pain 03/15/2015  . Atrophic vaginitis 03/15/2015  . Abnormal thyroid function test 01/24/2015  . IFG (impaired fasting glucose) 01/24/2015  . Overweight (BMI 25.0-29.9) 01/19/2015  . Urinary retention with incomplete bladder emptying 01/19/2015  . Hypertension   . Hyperlipidemia   . Osteoarthritis   . GERD (gastroesophageal reflux disease)   . Fibromyalgia   . Diverticulitis of colon 07/07/2009    Jerl Mina ,PT, DPT, E-RYT  11/11/2015, 12:54 AM  [place] MAIN [facility] SERVICES [address] [place], [place], [address] Phone: [phone]   Fax:  [phone]  Name: Whitney Cooper MRN: [id number] Date of Birth: Mar 24, 1961

## 2015-11-11 NOTE — Transfer of Care (Signed)
Immediate Anesthesia Transfer of Care Note  Patient: Whitney Cooper  Procedure(s) Performed: Procedure(s): COLONOSCOPY WITH PROPOFOL (N/A)  Patient Location: PACU  Anesthesia Type:General  Level of Consciousness: sedated  Airway & Oxygen Therapy: Patient Spontanous Breathing and Patient connected to face mask oxygen  Post-op Assessment: Report given to RN and Post -op Vital signs reviewed and stable  Post vital signs: Reviewed and stable  Last Vitals:  Filed Vitals:   11/11/15 0705 11/11/15 0706  BP:  127/82  Pulse:  99  Temp: 36.1 C   Resp:  20    Complications: No apparent anesthesia complications

## 2015-11-11 NOTE — Op Note (Signed)
[facility] Gastroenterology Patient Name: Whitney Cooper Procedure Date: 11/11/2015 7:41 AM MRN: [id number] Account #: [id number] Date of Birth: 22-Jul-1961 Admit Type: Outpatient Age: 55 Room: [facility] ENDO ROOM 3 Gender: Female Note Status: Finalized Procedure:            Colonoscopy Indications:          Personal history of colonic polyps Providers:            Lollie Sails, MD Referring MD:         Arnetha Courser (Referring MD) Medicines:            Monitored Anesthesia Care Complications:        No immediate complications. Procedure:            Pre-Anesthesia Assessment:                       - ASA Grade Assessment: III - A patient with severe                        systemic disease.                       After obtaining informed consent, the colonoscope was                        passed under direct vision. Throughout the procedure,                        the patient's blood pressure, pulse, and oxygen                        saturations were monitored continuously. The                        Colonoscope was introduced through the anus and                        advanced to the the cecum, identified by appendiceal                        orifice and ileocecal valve. The colonoscopy was                        performed without difficulty. The patient tolerated the                        procedure well. The quality of the bowel preparation                        was good. Findings:      Multiple small-mouthed diverticula were found in the sigmoid colon,       descending colon and transverse colon.      The digital rectal exam was normal.      Non-bleeding internal hemorrhoids were found during anoscopy. The       hemorrhoids were small and Grade I (internal hemorrhoids that do not       prolapse). Impression:           - Diverticulosis in the sigmoid colon, in the  descending colon and in the transverse colon.                       -  Non-bleeding internal hemorrhoids.                       - No specimens collected. Recommendation:       - Repeat colonoscopy in 5 years for surveillance. Procedure Code(s):    --- Professional ---                       [phone], Colonoscopy, flexible; diagnostic, including                        collection of specimen(s) by brushing or washing, when                        performed (separate procedure) Diagnosis Code(s):    --- Professional ---                       K64.0, First degree hemorrhoids                       Z86.010, Personal history of colonic polyps                       K57.30, Diverticulosis of large intestine without                        perforation or abscess without bleeding CPT copyright 2016 [employer]. All rights reserved. The codes documented in this report are preliminary and upon coder review may  be revised to meet current compliance requirements. Lollie Sails, MD 11/11/2015 8:07:07 AM This report has been signed electronically. Number of Addenda: 0 Note Initiated On: 11/11/2015 7:41 AM Scope Withdrawal Time: 0 hours 3 minutes 51 seconds  Total Procedure Duration: 0 hours 11 minutes 44 seconds       [facility]

## 2015-11-11 NOTE — Anesthesia Postprocedure Evaluation (Signed)
Anesthesia Post Note  Patient: Whitney Cooper  Procedure(s) Performed: Procedure(s) (LRB): COLONOSCOPY WITH PROPOFOL (N/A)  Patient location during evaluation: PACU Anesthesia Type: General Level of consciousness: awake and alert and oriented Pain management: pain level controlled Vital Signs Assessment: post-procedure vital signs reviewed and stable Respiratory status: spontaneous breathing Cardiovascular status: blood pressure returned to baseline Anesthetic complications: no    Last Vitals:  Filed Vitals:   11/11/15 0837 11/11/15 0847  BP: 114/73 116/77  Pulse: 74 71  Temp:    Resp: 13 13    Last Pain: There were no vitals filed for this visit.               Karolee Meloni

## 2015-11-11 NOTE — Anesthesia Procedure Notes (Signed)
Date/Time: 11/11/2015 7:45 AM Performed by: Allean Found Pre-anesthesia Checklist: Patient identified, Emergency Drugs available, Suction available, Patient being monitored and Timeout performed Patient Re-evaluated:Patient Re-evaluated prior to inductionOxygen Delivery Method: Nasal cannula Intubation Type: IV induction

## 2015-11-11 NOTE — H&P (Signed)
Outpatient short stay form Pre-procedure 11/11/2015 7:38 AM Whitney Sails MD  Primary Physician: Kelli Churn  Reason for visit:  Colonoscopy  History of present illness:  Whitney Cooper is a 55 year old female presenting today as above. She has a personal history of adenomatous colon polyps. She tolerated her prep well. She takes no aspirin products or blood thinning agents.    Current facility-administered medications:  .  0.9 %  sodium chloride infusion, , Intravenous, Continuous, Whitney Sails, MD, Last Rate: 20 mL/hr at 11/11/15 0721, 1,000 mL at 11/11/15 0721 .  0.9 %  sodium chloride infusion, , Intravenous, Continuous, Whitney Sails, MD  Prescriptions prior to admission  Medication Sig Dispense Refill Last Dose  . metoprolol succinate (TOPROL XL) 50 MG 24 hr tablet Take 1 tablet (50 mg total) by mouth daily. 30 tablet 11 11/11/2015 at 0600  . amitriptyline (ELAVIL) 10 MG tablet Take 2 tablets (20 mg total) by mouth at bedtime. 60 tablet 6 Taking  . atorvastatin (LIPITOR) 20 MG tablet Take 1 tablet (20 mg total) by mouth at bedtime. 30 tablet 3 Taking  . Cholecalciferol (VITAMIN D3) 2000 UNITS capsule Take 2,000 Units by mouth daily.   Taking  . Coenzyme Q10 (CO Q-10) 100 MG CAPS Take by mouth daily.    Taking  . Multiple Vitamin (MULTI-VITAMINS) TABS Take by mouth daily.   Taking  . pantoprazole (PROTONIX) 40 MG tablet Take 40 mg by mouth daily.   Taking  . [DISCONTINUED] estradiol (ESTRACE) 0.1 MG/GM vaginal cream Place 1 Applicatorful vaginally at bedtime. (Whitney Cooper not taking: Reported on 08/05/2015) 42.5 g 12 Not Taking  . [DISCONTINUED] solifenacin (VESICARE) 5 MG tablet Take 1 tablet (5 mg total) by mouth daily. (Whitney Cooper not taking: Reported on 08/05/2015) 30 tablet 6 Not Taking     Allergies  Allergen Reactions  . Codeine Other (See Comments)  . Sulfa Antibiotics Rash     Past Medical History  Diagnosis Date  . Hypertension   . Hyperlipidemia   .  Osteoarthritis   . GERD (gastroesophageal reflux disease)   . Fibromyalgia   . Recurrent UTI   . OAB (overactive bladder)   . History of kidney stones   . Beat, premature ventricular 06/21/2015  . Essential (primary) hypertension 06/21/2015  . Diverticulitis of colon 07/07/2009  . IFG (impaired fasting glucose) 01/24/2015  . Bundle branch block     Review of systems:      Physical Exam    Heart and lungs: Regular rate and rhythm without rub or gallop, lungs are bilaterally clear.    HEENT: Normocephalic atraumatic eyes are anicteric    Other:     Pertinant exam for procedure: Soft nontender nondistended bowel sounds positive normoactive.    Planned proceedures: Colonoscopy and indicated procedures. I have discussed the risks benefits and complications of procedures to include not limited to bleeding, infection, perforation and the risk of sedation and the Whitney Cooper wishes to proceed.    Whitney Sails, MD Gastroenterology 11/11/2015  7:38 AM

## 2015-11-11 NOTE — Anesthesia Preprocedure Evaluation (Signed)
Anesthesia Evaluation  Patient identified by MRN, date of birth, ID band Patient awake    Reviewed: Allergy & Precautions, NPO status , Patient's Chart, lab work & pertinent test results, reviewed documented beta blocker date and time   Airway Mallampati: II  TM Distance: >3 FB Neck ROM: Full    Dental  (+) Chipped   Pulmonary neg pulmonary ROS,    Pulmonary exam normal        Cardiovascular hypertension, Pt. on medications and Pt. on home beta blockers Normal cardiovascular exam+ dysrhythmias   BBB with occasional PVCs   Neuro/Psych    GI/Hepatic Neg liver ROS, GERD  Medicated and Controlled,  Endo/Other  negative endocrine ROS  Renal/GU negative Renal ROS Bladder dysfunction      Musculoskeletal  (+) Arthritis , Osteoarthritis,  Fibromyalgia -  Abdominal Normal abdominal exam  (+)   Peds negative pediatric ROS (+)  Hematology negative hematology ROS (+)   Anesthesia Other Findings   Reproductive/Obstetrics                             Anesthesia Physical Anesthesia Plan  ASA: III  Anesthesia Plan: General   Post-op Pain Management:    Induction: Intravenous  Airway Management Planned: Nasal Cannula  Additional Equipment:   Intra-op Plan:   Post-operative Plan:   Informed Consent: I have reviewed the patients History and Physical, chart, labs and discussed the procedure including the risks, benefits and alternatives for the proposed anesthesia with the patient or authorized representative who has indicated his/her understanding and acceptance.   Dental advisory given  Plan Discussed with: CRNA and Surgeon  Anesthesia Plan Comments:         Anesthesia Quick Evaluation

## 2015-11-14 ENCOUNTER — Other Ambulatory Visit: Payer: Self-pay | Admitting: Family Medicine

## 2015-11-14 DIAGNOSIS — Z5181 Encounter for therapeutic drug level monitoring: Secondary | ICD-10-CM

## 2015-11-14 DIAGNOSIS — E785 Hyperlipidemia, unspecified: Secondary | ICD-10-CM

## 2015-11-15 DIAGNOSIS — Z5181 Encounter for therapeutic drug level monitoring: Secondary | ICD-10-CM | POA: Insufficient documentation

## 2015-11-15 NOTE — Assessment & Plan Note (Signed)
Check fasting lipids 

## 2015-11-15 NOTE — Telephone Encounter (Signed)
Please ask the patient to just have fasting cholesterol and sgpt (liver enzyme) checked in the next week We'll do all of her other labs at her physical this summer, but I don't want to wait until then for these I sent the Rx as requested Thank you

## 2015-11-16 ENCOUNTER — Encounter: Payer: Self-pay | Admitting: Gastroenterology

## 2015-11-16 ENCOUNTER — Encounter: Payer: 59 | Admitting: Physical Therapy

## 2015-11-25 ENCOUNTER — Other Ambulatory Visit: Payer: Self-pay | Admitting: Family Medicine

## 2015-11-26 LAB — LIPID PANEL W/O CHOL/HDL RATIO
Cholesterol, Total: 170 mg/dL (ref 100–199)
HDL: 51 mg/dL (ref >39)
LDL Calculated: 103 mg/dL — ABNORMAL HIGH (ref 0–99)
TRIGLYCERIDES: 79 mg/dL (ref 0–149)
VLDL Cholesterol Cal: 16 mg/dL (ref 5–40)

## 2015-11-26 LAB — ALT: ALT: 17 IU/L (ref 0–32)

## 2015-11-28 ENCOUNTER — Other Ambulatory Visit: Payer: Self-pay | Admitting: Family Medicine

## 2015-11-28 NOTE — Telephone Encounter (Signed)
Thank you Let's see how she does tapering off of this Please have her call back if symptoms recur/persist Thank you

## 2015-11-28 NOTE — Telephone Encounter (Signed)
She states takes for esophageal spasms.  She states has an appt with you on June 30

## 2015-11-28 NOTE — Telephone Encounter (Signed)
Request received for TCA I reviewed previous EKG, and patient has a BBB Whitney Cooper, Please call patient and let her know that I am going to taper her off of her amitriptyline (Elavil) that she takes at night She has an EKG finding from 2015 that I noted when I dug through her chart, and I would suggest we taper her off of this and use something else if needed Please ask her why she takes this medicine; thank you

## 2015-11-29 ENCOUNTER — Ambulatory Visit: Payer: 59 | Attending: Family Medicine | Admitting: Physical Therapy

## 2015-11-29 DIAGNOSIS — R279 Unspecified lack of coordination: Secondary | ICD-10-CM | POA: Insufficient documentation

## 2015-11-30 NOTE — Therapy (Signed)
[place] MAIN [facility] SERVICES [address] [place], [place], [address] Phone: [phone]   Fax:  [phone]  Physical Therapy Treatment /Discharge Summary   Patient Details  Name: Whitney Cooper MRN: [id number] Date of Birth: Apr 20, 1961 No Data Recorded  Encounter Date: 11/29/2015      PT End of Session - 11/30/15 2322    Visit Number 10   Date for PT Re-Evaluation 11/23/15   Activity Tolerance Patient tolerated treatment well;No increased pain   Behavior During Therapy [facility] for tasks assessed/performed      Past Medical History  Diagnosis Date  . Hypertension   . Hyperlipidemia   . Osteoarthritis   . GERD (gastroesophageal reflux disease)   . Fibromyalgia   . Recurrent UTI   . OAB (overactive bladder)   . History of kidney stones   . Beat, premature ventricular 06/21/2015  . Essential (primary) hypertension 06/21/2015  . Diverticulitis of colon 07/07/2009  . IFG (impaired fasting glucose) 01/24/2015  . Bundle branch block     Past Surgical History  Procedure Laterality Date  . Total abdominal hysterectomy  Jan 2012    Complete, due to heavy bleeding and grape-fruit size fibroid   . Colonoscopy with propofol N/A 11/11/2015    Procedure: COLONOSCOPY WITH PROPOFOL;  Surgeon: Lollie Sails, MD;  Location: [facility] ENDOSCOPY;  Service: Endoscopy;  Laterality: N/A;    There were no vitals filed for this visit.      Subjective Assessment - 11/30/15 2310    Subjective Pt reports she has kept up with her bicycling            [facility] PT Assessment - 11/30/15 2311    Posture/Postural Control   Posture Comments excessive cues for decreased posterior lean of shoulders to pelvis in weight training                     [facility] PT Treatment/Exercise - 11/30/15 2311    Therapeutic Activites    Therapeutic Activities --  explained, demo'd , cued for proper alignment,use of weights   Neuro Re-ed    Neuro Re-ed  Details  trampoline jumping 30 sec s bilateral stance x 2, marching 30 sec before feeling slight concern for leakage                PT Education - 11/30/15 2313    Education provided Yes   Education Details D/C education, weight machines use and education   Person(s) Educated Patient   Methods Explanation;Demonstration;Tactile cues;Verbal cues;Handout   Comprehension Returned demonstration;Verbalized understanding             PT Long Term Goals - 11/29/15 1348    PT LONG TERM GOAL #1   Title Pt will  report decreased frequency of urination from 2x/hr to 1x/ every 2 hr in order to completes tasks at work .   Time 12   Period Weeks   Status Achieved   PT LONG TERM GOAL #2   Title Pt will report no leakage when she receives a hug from her son nor when she gets up from sitting.    Time 12   Period Weeks   Status Achieved   PT LONG TERM GOAL #3   Title Pt will report decreased pain with initial penetration from 6/10 to < 3/10 pain 100% of the time.     Time 12   Period Weeks   Status Achieved   PT LONG TERM GOAL #  4   Title Pt will be compliant with relaxation techniques to better manage IBS in order to decrease diarrhea episodes from 1x/ mo to zero times a month.    Time 12   Period Weeks   Status Achieved   PT LONG TERM GOAL #5   Title Pt will decrease her score on PFDI from 68% to < 50% in order to improve urogenital/ bowel function. (09/22/15: 26%)    Time 12   Period Weeks   Status Achieved   PT LONG TERM GOAL #6   Title Pt will decrease her ZUNG anxiety Scale from 59% to < 49% in order to show improved self-management of stress/ anxiety to minimize urgency and frequency.  (09/22/15: 42% )    Period Days   Status Achieved   PT LONG TERM GOAL #7   Title Pt will demo more upright alignment of thorax over pelvis with gait (correcting posterior thorax to pelvis)  in order to maintain optimal deep core function with gait.    Time 12   Period Weeks   Status Achieved    PT LONG TERM GOAL #8   Title Pt will demo decreased hyperextension of the knees and increased arch lift with static stance in order improve standing posture.    Time 12   Period Weeks   Status Achieved   PT LONG TERM GOAL  #9   TITLE Pt will demo inmprove lumbopelvic stability and 4/5 with shoulder scaption and opposite hip ext (thoracolumbar ) in order to improve posterior core mm for walking long distances.    Period Weeks   Status Achieved   PT LONG TERM GOAL  #10   TITLE Pt will demo increased strength with hip abd from R 3/5, L 4/5 to bil 5/5 for stairclimbing.    Time 12   Period Weeks   Status Achieved   PT LONG TERM GOAL  #11   TITLE Pt will report no leakage with bouncing on the pilates ball for 30 sec x 3 sets without leakage in order to return to jogging/ running.   Time 12   Period Weeks   Status Achieved   PT LONG TERM GOAL  #12   TITLE Pt will demo IND with running stretches and require no cuing for proper running mechanics in order to minimize risk for injuries.   Time 12   Period Weeks   Status Achieved               Plan - 11/30/15 2314    Clinical Impression Statement Pt achieved 100% of her goals across 10 visits . Pt showed compliance throughout these past 6 months despite experiencing a death in her family and work stress. Pt showed significant improvements with abdominal scar mobility,  PFDI score, pelvic floor coordination/strength, and postural alignment. Pt's continues to use relaxation techniques which has helped her to decrease her Zung anxiety score and to overcome urgency/ frequency.   Today, pt progressed to bouncing on a trampoline in bilateral stance without leakage across 2reps of 30 sec.  Pt was educated on use of weight machines and voiced understanding on how to continue with her HEP. Pt is ready for d/c at this time. Thank you for your referral.    Rehab Potential Good   Clinical Impairments Affecting Rehab Potential abdominal hysterectomy     PT Duration 12 weeks      Patient will benefit from skilled therapeutic intervention in order to improve the following deficits and impairments:  Improper body mechanics, Increased muscle spasms, Decreased strength, Postural dysfunction, Decreased mobility, Decreased activity tolerance, Impaired flexibility, Hypermobility, Decreased range of motion, Pain, Hypomobility, Decreased coordination, Decreased endurance, Decreased scar mobility, Increased fascial restricitons  Visit Diagnosis: Unspecified lack of coordination     Problem List Patient Active Problem List   Diagnosis Date Noted  . Medication monitoring encounter 11/15/2015  . Essential (primary) hypertension 06/21/2015  . Combined fat and carbohydrate induced hyperlipemia 06/21/2015  . Beat, premature ventricular 06/21/2015  . Urinary frequency 03/15/2015  . Strangury 03/15/2015  . Incontinence 03/15/2015  . Right flank pain 03/15/2015  . Atrophic vaginitis 03/15/2015  . Abnormal thyroid function test 01/24/2015  . IFG (impaired fasting glucose) 01/24/2015  . Overweight (BMI 25.0-29.9) 01/19/2015  . Urinary retention with incomplete bladder emptying 01/19/2015  . Hypertension   . Hyperlipidemia   . Osteoarthritis   . GERD (gastroesophageal reflux disease)   . Fibromyalgia   . Diverticulitis of colon 07/07/2009    Jerl Mina ,PT, DPT, E-RYT  11/30/2015, 11:22 PM  [place] MAIN [facility] SERVICES [address] [place], [place], [address] Phone: [phone]   Fax:  [phone]  Name: WHITTNI NARD MRN: [id number] Date of Birth: July 15, 1961

## 2015-12-15 ENCOUNTER — Encounter: Payer: Self-pay | Admitting: Emergency Medicine

## 2015-12-15 ENCOUNTER — Ambulatory Visit
Admission: EM | Admit: 2015-12-15 | Discharge: 2015-12-15 | Disposition: A | Discharge status: Home / Self Care | Payer: 59 | Attending: Emergency Medicine | Admitting: Emergency Medicine

## 2015-12-15 DIAGNOSIS — Z8041 Family history of malignant neoplasm of ovary: Secondary | ICD-10-CM | POA: Insufficient documentation

## 2015-12-15 DIAGNOSIS — Z833 Family history of diabetes mellitus: Secondary | ICD-10-CM | POA: Diagnosis not present

## 2015-12-15 DIAGNOSIS — Z882 Allergy status to sulfonamides status: Secondary | ICD-10-CM | POA: Diagnosis not present

## 2015-12-15 DIAGNOSIS — Z823 Family history of stroke: Secondary | ICD-10-CM | POA: Diagnosis not present

## 2015-12-15 DIAGNOSIS — I10 Essential (primary) hypertension: Secondary | ICD-10-CM | POA: Diagnosis not present

## 2015-12-15 DIAGNOSIS — N3281 Overactive bladder: Secondary | ICD-10-CM | POA: Diagnosis not present

## 2015-12-15 DIAGNOSIS — E785 Hyperlipidemia, unspecified: Secondary | ICD-10-CM | POA: Insufficient documentation

## 2015-12-15 DIAGNOSIS — Z8249 Family history of ischemic heart disease and other diseases of the circulatory system: Secondary | ICD-10-CM | POA: Insufficient documentation

## 2015-12-15 DIAGNOSIS — Z87440 Personal history of urinary (tract) infections: Secondary | ICD-10-CM | POA: Diagnosis not present

## 2015-12-15 DIAGNOSIS — Z87442 Personal history of urinary calculi: Secondary | ICD-10-CM | POA: Insufficient documentation

## 2015-12-15 DIAGNOSIS — M62838 Other muscle spasm: Secondary | ICD-10-CM

## 2015-12-15 DIAGNOSIS — I454 Nonspecific intraventricular block: Secondary | ICD-10-CM | POA: Diagnosis not present

## 2015-12-15 DIAGNOSIS — R42 Dizziness and giddiness: Secondary | ICD-10-CM | POA: Insufficient documentation

## 2015-12-15 DIAGNOSIS — H81392 Other peripheral vertigo, left ear: Secondary | ICD-10-CM | POA: Insufficient documentation

## 2015-12-15 DIAGNOSIS — M6248 Contracture of muscle, other site: Secondary | ICD-10-CM

## 2015-12-15 DIAGNOSIS — R51 Headache: Secondary | ICD-10-CM

## 2015-12-15 DIAGNOSIS — Z809 Family history of malignant neoplasm, unspecified: Secondary | ICD-10-CM | POA: Insufficient documentation

## 2015-12-15 DIAGNOSIS — Z885 Allergy status to narcotic agent status: Secondary | ICD-10-CM | POA: Insufficient documentation

## 2015-12-15 DIAGNOSIS — M797 Fibromyalgia: Secondary | ICD-10-CM | POA: Insufficient documentation

## 2015-12-15 DIAGNOSIS — K219 Gastro-esophageal reflux disease without esophagitis: Secondary | ICD-10-CM | POA: Insufficient documentation

## 2015-12-15 DIAGNOSIS — R519 Headache, unspecified: Secondary | ICD-10-CM

## 2015-12-15 DIAGNOSIS — M1990 Unspecified osteoarthritis, unspecified site: Secondary | ICD-10-CM | POA: Diagnosis not present

## 2015-12-15 MED ORDER — METAXALONE 800 MG PO TABS: 800.0000 mg | ORAL_TABLET | Freq: Three times a day (TID) | ORAL | Status: DC

## 2015-12-15 MED ORDER — MECLIZINE HCL 25 MG PO TABS: 25.0000 mg | ORAL_TABLET | Freq: Three times a day (TID) | ORAL | Status: DC | PRN

## 2015-12-15 MED ORDER — DICLOFENAC SODIUM 75 MG PO TBEC: 75.0000 mg | DELAYED_RELEASE_TABLET | Freq: Two times a day (BID) | ORAL | Status: DC

## 2015-12-15 NOTE — ED Notes (Signed)
Pt reports dizziness, nausea, and dull headache that started last night and worsened after going to work this morning. Pt also reports 5 days of upper back/shoulder pain
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
## 2016-01-12 ENCOUNTER — Other Ambulatory Visit: Payer: Self-pay | Admitting: Family Medicine

## 2016-01-12 NOTE — Telephone Encounter (Signed)
May 2017 sgpt and lipids reviewed; Rx approved; appt next week

## 2016-01-17 ENCOUNTER — Other Ambulatory Visit: Payer: Self-pay | Admitting: Family Medicine

## 2016-01-17 DIAGNOSIS — Z1231 Encounter for screening mammogram for malignant neoplasm of breast: Secondary | ICD-10-CM

## 2016-01-20 ENCOUNTER — Ambulatory Visit (INDEPENDENT_AMBULATORY_CARE_PROVIDER_SITE_OTHER): Payer: 59 | Admitting: Family Medicine

## 2016-01-20 ENCOUNTER — Encounter: Payer: Self-pay | Admitting: Family Medicine

## 2016-01-20 VITALS — BP 122/82 | HR 88 | Temp 97.9°F | Resp 14 | Wt 152.0 lb

## 2016-01-20 DIAGNOSIS — E785 Hyperlipidemia, unspecified: Secondary | ICD-10-CM | POA: Diagnosis not present

## 2016-01-20 DIAGNOSIS — Z0000 Encounter for general adult medical examination without abnormal findings: Secondary | ICD-10-CM | POA: Insufficient documentation

## 2016-01-20 DIAGNOSIS — I10 Essential (primary) hypertension: Secondary | ICD-10-CM | POA: Diagnosis not present

## 2016-01-20 DIAGNOSIS — R7301 Impaired fasting glucose: Secondary | ICD-10-CM

## 2016-01-20 DIAGNOSIS — Z1159 Encounter for screening for other viral diseases: Secondary | ICD-10-CM | POA: Insufficient documentation

## 2016-01-20 DIAGNOSIS — R946 Abnormal results of thyroid function studies: Secondary | ICD-10-CM | POA: Diagnosis not present

## 2016-01-20 DIAGNOSIS — Z114 Encounter for screening for human immunodeficiency virus [HIV]: Secondary | ICD-10-CM | POA: Insufficient documentation

## 2016-01-20 LAB — COMPLETE METABOLIC PANEL WITH GFR
ALBUMIN: 4.5 g/dL (ref 3.6–5.1)
ALK PHOS: 51 U/L (ref 33–130)
ALT: 18 U/L (ref 6–29)
AST: 15 U/L (ref 10–35)
BILIRUBIN TOTAL: 0.5 mg/dL (ref 0.2–1.2)
BUN: 15 mg/dL (ref 7–25)
CALCIUM: 9.5 mg/dL (ref 8.6–10.4)
CO2: 26 mmol/L (ref 20–31)
Chloride: 106 mmol/L (ref 98–110)
Creat: 0.67 mg/dL (ref 0.50–1.05)
GLUCOSE: 82 mg/dL (ref 65–99)
Potassium: 4.5 mmol/L (ref 3.5–5.3)
Sodium: 144 mmol/L (ref 135–146)
TOTAL PROTEIN: 6.6 g/dL (ref 6.1–8.1)

## 2016-01-20 LAB — CBC WITH DIFFERENTIAL/PLATELET
BASOS ABS: 0 {cells}/uL (ref 0–200)
Basophils Relative: 0 %
EOS ABS: 192 {cells}/uL (ref 15–500)
Eosinophils Relative: 2 %
HEMATOCRIT: 39.3 % (ref 35.0–45.0)
HEMOGLOBIN: 13.2 g/dL (ref 11.7–15.5)
LYMPHS ABS: 2016 {cells}/uL (ref 850–3900)
Lymphocytes Relative: 21 %
MCH: 28.6 pg (ref 27.0–33.0)
MCHC: 33.6 g/dL (ref 32.0–36.0)
MCV: 85.2 fL (ref 80.0–100.0)
MPV: 12 fL (ref 7.5–12.5)
Monocytes Absolute: 768 cells/uL (ref 200–950)
Monocytes Relative: 8 %
NEUTROS ABS: 6624 {cells}/uL (ref 1500–7800)
Neutrophils Relative %: 69 %
Platelets: 272 10*3/uL (ref 140–400)
RBC: 4.61 MIL/uL (ref 3.80–5.10)
RDW: 13.3 % (ref 11.0–15.0)
WBC: 9.6 10*3/uL (ref 3.8–10.8)

## 2016-01-20 LAB — HEMOGLOBIN A1C
Hgb A1c MFr Bld: 5.5 % (ref <5.7)
Mean Plasma Glucose: 111 mg/dL

## 2016-01-20 LAB — TSH: TSH: 2.68 mIU/L

## 2016-01-20 NOTE — Assessment & Plan Note (Signed)
Discussed one-time HIV screening recommendation per USPSTF guidelines; patient agrees with testing; HIV antibody ordered 

## 2016-01-20 NOTE — Progress Notes (Signed)
Patient ID: Whitney Cooper, female   DOB: Dec 07, 1960, 55 y.o.   MRN: [id number]   Subjective:   Whitney Cooper is a 55 y.o. female here for a complete physical exam  Interim issues since last visit: saw flashes and saw eye doctor, using lubricating drops; OD >> OS [place]  USPSTF grade A and B recommendations Alcohol: no, rare Depression:  Depression screen [facility] 2/9 01/20/2016 01/19/2015  Decreased Interest 0 0  Down, Depressed, Hopeless 0 0  PHQ - 2 Score 0 0   Hypertension: no Obesity: no; has lost 20 pounds over the last year; nurse told her last year to cut out sugar and she has changed to a plant-based diet mostly; really working on her weight Tobacco use: no HIV, hep B, hep C:  STD testing and prevention (chl/gon/syphilis):  Lipids: May Glucose: check today Colorectal cancer: UTD Breast cancer: due in July BRCA gene screening: no breast cancer in family Intimate partner violence: no Cervical cancer screening: had total hysterectomy, used to see Dr. Charlotte Sanes, everything gone; for fibroids, not cancer Lung cancer: n/a Osteoporosis: no steroids, had DEXA and it was good, wait until 49 Fall prevention/vitamin D: discused, takes 1000 iu daily AAA: n/a Aspirin: 81 mg coated Diet: mostly plant-based diet, cut out red meat; some salmon, some chicken Exercise: staing active Skin cancer: goes in November to dermatologist  Med list reviewed; not taking TCA any more; shoulder issues; trapezius issues, all knotted up; sees chiropractor; doing acupuncture and therapy  Past Medical History  Diagnosis Date  . Hypertension   . Hyperlipidemia   . Osteoarthritis   . GERD (gastroesophageal reflux disease)   . Fibromyalgia   . Recurrent UTI   . OAB (overactive bladder)   . History of kidney stones   . Beat, premature ventricular 06/21/2015  . Essential (primary) hypertension 06/21/2015  . Diverticulitis of colon 07/07/2009  . IFG (impaired fasting glucose) 01/24/2015   . Bundle branch block   . Dry eyes    Past Surgical History  Procedure Laterality Date  . Total abdominal hysterectomy  Jan 2012    Complete, due to heavy bleeding and grape-fruit size fibroid   . Colonoscopy with propofol N/A 11/11/2015    Procedure: COLONOSCOPY WITH PROPOFOL;  Surgeon: Lollie Sails, MD;  Location: [facility] ENDOSCOPY;  Service: Endoscopy;  Laterality: N/A;   Family History  Problem Relation Age of Onset  . Ovarian cancer Maternal Grandmother 66  . Cancer Maternal Grandmother   . Hypertension Mother   . Cancer Mother     skin  . Heart disease Father     CHF  . Hypertension Father   . Stroke Father     mini strokes  . Cancer Father     skin  . Heart attack Father   . Thyroid disease Father   . Heart attack Maternal Grandfather   . Heart attack Paternal Grandfather   . Diabetes Maternal Uncle   . Thyroid disease Sister    Social History  Substance Use Topics  . Smoking status: Never Smoker   . Smokeless tobacco: Never Used  . Alcohol Use: No   Review of Systems  Constitutional: Negative for unexpected weight change.  HENT: Negative for hearing loss.   Eyes: Positive for visual disturbance (cataracts and flashers).  Respiratory: Negative for wheezing.   Cardiovascular: Negative for palpitations (sees Dr. Nehemiah Massed).  Gastrointestinal: Negative for blood in stool.  Genitourinary: Negative for pelvic pain.   Objective:   Filed  Vitals:   01/20/16 0930  BP: 122/82  Pulse: 88  Temp: 97.9 F (36.6 C)  TempSrc: Oral  Resp: 14  Weight: 152 lb (68.947 kg)  SpO2: 95%   Body mass index is 26.5 kg/(m^2). Wt Readings from Last 3 Encounters:  01/20/16 152 lb (68.947 kg)  12/15/15 150 lb (68.04 kg)  11/11/15 148 lb (67.132 kg)   Physical Exam  Constitutional: She appears well-developed and well-nourished.  HENT:  Head: Normocephalic and atraumatic.  Right Ear: Hearing, tympanic membrane, external ear and ear canal normal.  Left Ear: Hearing,  tympanic membrane, external ear and ear canal normal.  Eyes: Conjunctivae and EOM are normal. Right eye exhibits no hordeolum. Left eye exhibits no hordeolum. No scleral icterus.  Neck: Carotid bruit is not present. No thyromegaly present.  Cardiovascular: Normal rate, regular rhythm, S1 normal, S2 normal and normal heart sounds.   No extrasystoles are present.  Pulmonary/Chest: Effort normal and breath sounds normal. No respiratory distress. Right breast exhibits no inverted nipple, no mass, no nipple discharge, no skin change and no tenderness. Left breast exhibits no inverted nipple, no mass, no nipple discharge, no skin change and no tenderness. Breasts are symmetrical.  Dense breast tissue but no dominant masses  Abdominal: Soft. Normal appearance and bowel sounds are normal. She exhibits no distension, no abdominal bruit, no pulsatile midline mass and no mass. There is no hepatosplenomegaly. There is no tenderness. No hernia.  Musculoskeletal: Normal range of motion. She exhibits no edema.  Lymphadenopathy:       Head (right side): No submandibular adenopathy present.       Head (left side): No submandibular adenopathy present.    She has no cervical adenopathy.    She has no axillary adenopathy.  Neurological: She is alert. She displays no tremor. No cranial nerve deficit. She exhibits normal muscle tone. Gait normal.  Reflex Scores:      Patellar reflexes are 2+ on the right side and 2+ on the left side. Skin: Skin is warm and dry. No bruising and no ecchymosis noted. No cyanosis. No pallor.  Psychiatric: Her speech is normal and behavior is normal. Thought content normal. Her mood appears not anxious. She does not exhibit a depressed mood.    Assessment/Plan:   Problem List Items Addressed This Visit      Cardiovascular and Mediastinum   Essential (primary) hypertension    Well-controlled; pleased with her weight loss and dietary changes      Relevant Medications   aspirin EC  81 MG tablet     Endocrine   IFG (impaired fasting glucose)    Check A1c and glucose; so pleased with dietary changes and weigh tloss      Relevant Orders   Hemoglobin A1c     Other   Screening for HIV (human immunodeficiency virus)    Discussed one-time HIV screening recommendation per USPSTF guidelines; patient agrees with testing; HIV antibody ordered      Relevant Orders   HIV antibody   Preventative health care - Primary    USPSTF grade A and B recommendations reviewed with patient; age-appropriate recommendations, preventive care, screening tests, etc discussed and encouraged; healthy living encouraged; see AVS for patient education given to patient      Relevant Orders   TSH   CBC with Differential/Platelet   COMPLETE METABOLIC PANEL WITH GFR   Need for hepatitis C screening test    Discussed one-time hep C screening recommendation for individuals born between 1945-1965 per  USPSTF guidelines; patient agrees with testing; Hep C Ab ordered      Relevant Orders   Hepatitis C Antibody   Hyperlipidemia    So pleased with dietary changes and weight loss; reviewed last SGPT and lipid panel      Relevant Medications   aspirin EC 81 MG tablet   Abnormal thyroid function test    Check TSH         Meds ordered this encounter  Medications  . aspirin EC 81 MG tablet    Sig: Take 1 tablet (81 mg total) by mouth daily.  . Cholecalciferol (VITAMIN D3) 1000 units CAPS    Sig: Take 1 capsule (1,000 Units total) by mouth daily.  . diclofenac (VOLTAREN) 75 MG EC tablet    Sig: Take 1 tablet (75 mg total) by mouth 2 (two) times daily as needed. Take with food    Dispense:  30 tablet    Refill:  0   Orders Placed This Encounter  Procedures  . Hepatitis C Antibody  . HIV antibody  . TSH  . CBC with Differential/Platelet  . COMPLETE METABOLIC PANEL WITH GFR  . Hemoglobin A1c   Follow up plan: Return in about 1 year (around 01/19/2017) for complete physical; 6 months for  prediabetes and cholesterol. An after-visit summary was printed and given to the patient at [facility].  Please see the patient instructions which may contain other information and recommendations beyond what is mentioned above in the assessment and plan.

## 2016-01-20 NOTE — Assessment & Plan Note (Addendum)
Well-controlled; pleased with her weight loss and dietary changes

## 2016-01-20 NOTE — Assessment & Plan Note (Signed)
Check TSH 

## 2016-01-20 NOTE — Assessment & Plan Note (Signed)
Discussed one-time hep C screening recommendation for individuals born between 1945-1965 per USPSTF guidelines; patient agrees with testing; Hep C Ab ordered 

## 2016-01-20 NOTE — Assessment & Plan Note (Signed)
Check A1c and glucose; so pleased with dietary changes and weigh tloss

## 2016-01-20 NOTE — Patient Instructions (Addendum)
Check out quinoa as a protein source Check out [employer] and other websites for [employer] ideas and recipes Try meatless crumbles, Boca burgers, Sweet Earth seitan, [place] vegan sausage mix (all plant protein), Sweet Earth [place] breakfast burritos, etc. You can further limit intake of saturated fats by switching to dairy free products (coconut milk coffee creamer, almond milk, Tofutti brand cream cheese and sour cream, Follow Your Heart vegenaise (mayonnaise alternative) and Follow Your Heart cheese alternative (the Antoine Primas is my favorite) Chao tofu cheese alternative  We'll get labs today and contact you with the results  Health Maintenance, Female Adopting a healthy lifestyle and getting preventive care can go a long way to promote health and wellness. Talk with your health care provider about what schedule of regular examinations is right for you. This is a good chance for you to check in with your provider about disease prevention and staying healthy. In between checkups, there are plenty of things you can do on your own. Experts have done a lot of research about which lifestyle changes and preventive measures are most likely to keep you healthy. Ask your health care provider for more information. WEIGHT AND DIET  Eat a healthy diet  Be sure to include plenty of vegetables, fruits, low-fat dairy products, and lean protein.  Do not eat a lot of foods high in solid fats, added sugars, or salt.  Get regular exercise. This is one of the most important things you can do for your health.  Most adults should exercise for at least 150 minutes each week. The exercise should increase your heart rate and make you sweat (moderate-intensity exercise).  Most adults should also do strengthening exercises at least twice a week. This is in addition to the moderate-intensity exercise.  Maintain a healthy weight  Body mass index (BMI) is a measurement that can be used to identify  possible weight problems. It estimates body fat based on height and weight. Your health care provider can help determine your BMI and help you achieve or maintain a healthy weight.  For females 66 years of age and older:   A BMI below 18.5 is considered underweight.  A BMI of 18.5 to 24.9 is normal.  A BMI of 25 to 29.9 is considered overweight.  A BMI of 30 and above is considered obese.  Watch levels of cholesterol and blood lipids  You should start having your blood tested for lipids and cholesterol at 57 years of age, then have this test every 5 years.  You may need to have your cholesterol levels checked more often if:  Your lipid or cholesterol levels are high.  You are older than 57 years of age.  You are at high risk for heart disease.  CANCER SCREENING   Lung Cancer  Lung cancer screening is recommended for adults 23-41 years old who are at high risk for lung cancer because of a history of smoking.  A yearly low-dose CT scan of the lungs is recommended for people who:  Currently smoke.  Have quit within the past 15 years.  Have at least a 30-pack-year history of smoking. A pack year is smoking an average of one pack of cigarettes a day for 1 year.  Yearly screening should continue until it has been 15 years since you quit.  Yearly screening should stop if you develop a health problem that would prevent you from having lung cancer treatment.  Breast Cancer  Practice breast self-awareness. This means  understanding how your breasts normally appear and feel.  It also means doing regular breast self-exams. Let your health care provider know about any changes, no matter how small.  If you are in your 20s or 30s, you should have a clinical breast exam (CBE) by a health care provider every 1-3 years as part of a regular health exam.  If you are 83 or older, have a CBE every year. Also consider having a breast X-ray (mammogram) every year.  If you have a family  history of breast cancer, talk to your health care provider about genetic screening.  If you are at high risk for breast cancer, talk to your health care provider about having an MRI and a mammogram every year.  Breast cancer gene (BRCA) assessment is recommended for women who have family members with BRCA-related cancers. BRCA-related cancers include:  Breast.  Ovarian.  Tubal.  Peritoneal cancers.  Results of the assessment will determine the need for genetic counseling and BRCA1 and BRCA2 testing. Cervical Cancer Your health care provider may recommend that you be screened regularly for cancer of the pelvic organs (ovaries, uterus, and vagina). This screening involves a pelvic examination, including checking for microscopic changes to the surface of your cervix (Pap test). You may be encouraged to have this screening done every 3 years, beginning at age 74.  For women ages 7-65, health care providers may recommend pelvic exams and Pap testing every 3 years, or they may recommend the Pap and pelvic exam, combined with testing for human papilloma virus (HPV), every 5 years. Some types of HPV increase your risk of cervical cancer. Testing for HPV may also be done on women of any age with unclear Pap test results.  Other health care providers may not recommend any screening for nonpregnant women who are considered low risk for pelvic cancer and who do not have symptoms. Ask your health care provider if a screening pelvic exam is right for you.  If you have had past treatment for cervical cancer or a condition that could lead to cancer, you need Pap tests and screening for cancer for at least 20 years after your treatment. If Pap tests have been discontinued, your risk factors (such as having a new sexual partner) need to be reassessed to determine if screening should resume. Some women have medical problems that increase the chance of getting cervical cancer. In these cases, your health care  provider may recommend more frequent screening and Pap tests. Colorectal Cancer  This type of cancer can be detected and often prevented.  Routine colorectal cancer screening usually begins at 57 years of age and continues through 57 years of age.  Your health care provider may recommend screening at an earlier age if you have risk factors for colon cancer.  Your health care provider may also recommend using home test kits to check for hidden blood in the stool.  A small camera at the end of a tube can be used to examine your colon directly (sigmoidoscopy or colonoscopy). This is done to check for the earliest forms of colorectal cancer.  Routine screening usually begins at age 35.  Direct examination of the colon should be repeated every 5-10 years through 57 years of age. However, you may need to be screened more often if early forms of precancerous polyps or small growths are found. Skin Cancer  Check your skin from head to toe regularly.  Tell your health care provider about any new moles or changes in  moles, especially if there is a change in a mole's shape or color.  Also tell your health care provider if you have a mole that is larger than the size of a pencil eraser.  Always use sunscreen. Apply sunscreen liberally and repeatedly throughout the day.  Protect yourself by wearing long sleeves, pants, a wide-brimmed hat, and sunglasses whenever you are outside. HEART DISEASE, DIABETES, AND HIGH BLOOD PRESSURE   High blood pressure causes heart disease and increases the risk of stroke. High blood pressure is more likely to develop in:  People who have blood pressure in the high end of the normal range (130-139/85-89 mm Hg).  People who are overweight or obese.  People who are African American.  If you are 79-52 years of age, have your blood pressure checked every 3-5 years. If you are 80 years of age or older, have your blood pressure checked every year. You should have your  blood pressure measured twice--once when you are at a hospital or clinic, and once when you are not at a hospital or clinic. Record the average of the two measurements. To check your blood pressure when you are not at a hospital or clinic, you can use:  An automated blood pressure machine at a pharmacy.  A home blood pressure monitor.  If you are between 74 years and 47 years old, ask your health care provider if you should take aspirin to prevent strokes.  Have regular diabetes screenings. This involves taking a blood sample to check your fasting blood sugar level.  If you are at a normal weight and have a low risk for diabetes, have this test once every three years after 57 years of age.  If you are overweight and have a high risk for diabetes, consider being tested at a younger age or more often. PREVENTING INFECTION  Hepatitis B  If you have a higher risk for hepatitis B, you should be screened for this virus. You are considered at high risk for hepatitis B if:  You were born in a country where hepatitis B is common. Ask your health care provider which countries are considered high risk.  Your parents were born in a high-risk country, and you have not been immunized against hepatitis B (hepatitis B vaccine).  You have HIV or AIDS.  You use needles to inject street drugs.  You live with someone who has hepatitis B.  You have had sex with someone who has hepatitis B.  You get hemodialysis treatment.  You take certain medicines for conditions, including cancer, organ transplantation, and autoimmune conditions. Hepatitis C  Blood testing is recommended for:  Everyone born from [birth year] through 1965.  Anyone with known risk factors for hepatitis C. Sexually transmitted infections (STIs)  You should be screened for sexually transmitted infections (STIs) including gonorrhea and chlamydia if:  You are sexually active and are younger than 57 years of age.  You are older than 57  years of age and your health care provider tells you that you are at risk for this type of infection.  Your sexual activity has changed since you were last screened and you are at an increased risk for chlamydia or gonorrhea. Ask your health care provider if you are at risk.  If you do not have HIV, but are at risk, it may be recommended that you take a prescription medicine daily to prevent HIV infection. This is called pre-exposure prophylaxis (PrEP). You are considered at risk if:  You are sexually  active and do not regularly use condoms or know the HIV status of your partner(s).  You take drugs by injection.  You are sexually active with a partner who has HIV. Talk with your health care provider about whether you are at high risk of being infected with HIV. If you choose to begin PrEP, you should first be tested for HIV. You should then be tested every 3 months for as long as you are taking PrEP.  PREGNANCY   If you are premenopausal and you may become pregnant, ask your health care provider about preconception counseling.  If you may become pregnant, take 400 to 800 micrograms (mcg) of folic acid every day.  If you want to prevent pregnancy, talk to your health care provider about birth control (contraception). OSTEOPOROSIS AND MENOPAUSE   Osteoporosis is a disease in which the bones lose minerals and strength with aging. This can result in serious bone fractures. Your risk for osteoporosis can be identified using a bone density scan.  If you are 31 years of age or older, or if you are at risk for osteoporosis and fractures, ask your health care provider if you should be screened.  Ask your health care provider whether you should take a calcium or vitamin D supplement to lower your risk for osteoporosis.  Menopause may have certain physical symptoms and risks.  Hormone replacement therapy may reduce some of these symptoms and risks. Talk to your health care provider about whether  hormone replacement therapy is right for you.  HOME CARE INSTRUCTIONS   Schedule regular health, dental, and eye exams.  Stay current with your immunizations.   Do not use any tobacco products including cigarettes, chewing tobacco, or electronic cigarettes.  If you are pregnant, do not drink alcohol.  If you are breastfeeding, limit how much and how often you drink alcohol.  Limit alcohol intake to no more than 1 drink per day for nonpregnant women. One drink equals 12 ounces of beer, 5 ounces of wine, or 1 ounces of hard liquor.  Do not use street drugs.  Do not share needles.  Ask your health care provider for help if you need support or information about quitting drugs.  Tell your health care provider if you often feel depressed.  Tell your health care provider if you have ever been abused or do not feel safe at home.   This information is not intended to replace advice given to you by your health care provider. Make sure you discuss any questions you have with your health care provider.   Document Released: 01/22/2011 Document Revised: 07/30/2014 Document Reviewed: 06/10/2013 Elsevier Interactive Patient Education [employer].

## 2016-01-20 NOTE — Assessment & Plan Note (Addendum)
So pleased with dietary changes and weight loss; reviewed last SGPT and lipid panel

## 2016-01-20 NOTE — Assessment & Plan Note (Signed)
USPSTF grade A and B recommendations reviewed with patient; age-appropriate recommendations, preventive care, screening tests, etc discussed and encouraged; healthy living encouraged; see AVS for patient education given to patient  

## 2016-01-21 LAB — HIV ANTIBODY (ROUTINE TESTING W REFLEX): HIV 1&2 Ab, 4th Generation: NONREACTIVE

## 2016-01-21 LAB — HEPATITIS C ANTIBODY: HCV Ab: NEGATIVE

## 2016-01-25 ENCOUNTER — Telehealth: Payer: Self-pay | Admitting: Emergency Medicine

## 2016-01-25 NOTE — Telephone Encounter (Signed)
Patient notified of lab results

## 2016-02-02 ENCOUNTER — Ambulatory Visit: Payer: 59

## 2016-02-06 ENCOUNTER — Ambulatory Visit
Admission: RE | Admit: 2016-02-06 | Discharge: 2016-02-06 | Disposition: A | Discharge status: Home / Self Care | Payer: 59 | Source: Ambulatory Visit | Attending: Family Medicine | Admitting: Family Medicine

## 2016-02-06 ENCOUNTER — Other Ambulatory Visit: Payer: Self-pay | Admitting: Family Medicine

## 2016-02-06 DIAGNOSIS — Z1231 Encounter for screening mammogram for malignant neoplasm of breast: Secondary | ICD-10-CM

## 2016-02-10 ENCOUNTER — Ambulatory Visit (INDEPENDENT_AMBULATORY_CARE_PROVIDER_SITE_OTHER): Payer: 59 | Admitting: Family Medicine

## 2016-02-10 ENCOUNTER — Encounter: Payer: Self-pay | Admitting: Family Medicine

## 2016-02-10 ENCOUNTER — Ambulatory Visit
Admission: RE | Admit: 2016-02-10 | Discharge: 2016-02-10 | Disposition: A | Discharge status: Home / Self Care | Payer: 59 | Source: Ambulatory Visit | Attending: Family Medicine | Admitting: Family Medicine

## 2016-02-10 ENCOUNTER — Telehealth: Payer: Self-pay

## 2016-02-10 VITALS — BP 116/72 | HR 77 | Temp 97.6°F | Resp 16 | Wt 153.3 lb

## 2016-02-10 DIAGNOSIS — M8580 Other specified disorders of bone density and structure, unspecified site: Secondary | ICD-10-CM | POA: Insufficient documentation

## 2016-02-10 DIAGNOSIS — M542 Cervicalgia: Secondary | ICD-10-CM | POA: Insufficient documentation

## 2016-02-10 DIAGNOSIS — M546 Pain in thoracic spine: Secondary | ICD-10-CM | POA: Insufficient documentation

## 2016-02-10 DIAGNOSIS — G8929 Other chronic pain: Secondary | ICD-10-CM | POA: Diagnosis not present

## 2016-02-10 DIAGNOSIS — M549 Dorsalgia, unspecified: Principal | ICD-10-CM

## 2016-02-10 HISTORY — DX: Other specified disorders of bone density and structure, unspecified site: M85.80

## 2016-02-10 MED ORDER — HYDROCODONE-ACETAMINOPHEN 5-325 MG PO TABS: 1.0000 | ORAL_TABLET | Freq: Four times a day (QID) | ORAL | Status: DC | PRN

## 2016-02-10 MED ORDER — GABAPENTIN 100 MG PO CAPS: 100.0000 mg | ORAL_CAPSULE | Freq: Every day | ORAL | Status: DC

## 2016-02-10 MED ORDER — PREDNISONE 20 MG PO TABS: ORAL_TABLET | ORAL | Status: AC

## 2016-02-10 NOTE — Patient Instructions (Addendum)
Please gave xrays done Start the prednisone today, and use as directed for 6 days; take NO other NSAIDs while on this medicine Use the gabapentin to help with possible nerve involvement Use the pain medicine if needed; do NOT mix with any alcohol or other pain pills

## 2016-02-10 NOTE — Progress Notes (Signed)
BP 116/72   Pulse 77   Temp 97.6 F (36.4 C) (Oral)   Resp 16   Wt 153 lb 4.8 oz (69.5 kg)   SpO2 96%   BMI 26.73 kg/m     Subjective:    Patient ID: Whitney Cooper, female    DOB: 08/04/1960, 55 y.o.   MRN: [id number]  HPI: OTHA RICKLES is a 55 y.o. female  Chief Complaint  Patient presents with  . Referral  patient is here to get a referral to see someone about her neck and upper back Pain level today; close to a 10 out of 10 She is having significant pain in her neck, upper back She has not had xrays No loss of control of B/B  Relevant past medical, surgical, family and social history reviewed Past Medical History:  Diagnosis Date  . Beat, premature ventricular 06/21/2015  . Bundle branch block   . Diverticulitis of colon 07/07/2009  . Dry eyes   . Essential (primary) hypertension 06/21/2015  . Fibromyalgia   . GERD (gastroesophageal reflux disease)   . History of kidney stones   . Hyperlipidemia   . Hypertension   . IFG (impaired fasting glucose) 01/24/2015  . OAB (overactive bladder)   . Osteoarthritis   . Osteopenia determined by x-ray 02/10/2016  . Recurrent UTI    Past Surgical History:  Procedure Laterality Date  . COLONOSCOPY WITH PROPOFOL N/A 11/11/2015   Procedure: COLONOSCOPY WITH PROPOFOL;  Surgeon: Lollie Sails, MD;  Location: [facility] ENDOSCOPY;  Service: Endoscopy;  Laterality: N/A;  . TOTAL ABDOMINAL HYSTERECTOMY  Jan 2012   Complete, due to heavy bleeding and grape-fruit size fibroid    Family History  Problem Relation Age of Onset  . Ovarian cancer Maternal Grandmother 66  . Cancer Maternal Grandmother   . Hypertension Mother   . Cancer Mother     skin  . Heart disease Father     CHF  . Hypertension Father   . Stroke Father     mini strokes  . Cancer Father     skin  . Heart attack Father   . Thyroid disease Father   . Heart attack Maternal Grandfather   . Heart attack Paternal Grandfather   . Diabetes Maternal Uncle   .  Thyroid disease Sister   . Breast cancer Neg Hx    Social History  Substance Use Topics  . Smoking status: Never Smoker  . Smokeless tobacco: Never Used  . Alcohol use No   Interim medical history since last visit reviewed. Allergies and medications reviewed  Review of Systems Per HPI unless specifically indicated above     Objective:    BP 116/72   Pulse 77   Temp 97.6 F (36.4 C) (Oral)   Resp 16   Wt 153 lb 4.8 oz (69.5 kg)   SpO2 96%   BMI 26.73 kg/m    Wt Readings from Last 3 Encounters:  02/10/16 153 lb 4.8 oz (69.5 kg)  01/20/16 152 lb (68.9 kg)  12/15/15 150 lb (68 kg)    Physical Exam  Constitutional: She appears well-developed and well-nourished. No distress.  Eyes: EOM are normal. No scleral icterus.  Neck: Muscular tenderness present. Decreased range of motion present. No thyromegaly present.  Cardiovascular: Normal rate.   Pulmonary/Chest: Effort normal.  Abdominal: She exhibits no distension.  Musculoskeletal:       Cervical back: She exhibits decreased range of motion, tenderness and pain. She exhibits no deformity.  Skin: No pallor.  Psychiatric: She has a normal mood and affect. Her behavior is normal. Judgment and thought content normal.   Results for orders placed or performed in visit on 01/20/16  Hepatitis C Antibody  Result Value Ref Range   HCV Ab NEGATIVE NEGATIVE  HIV antibody  Result Value Ref Range   HIV 1&2 Ab, 4th Generation NONREACTIVE NONREACTIVE  TSH  Result Value Ref Range   TSH 2.68 mIU/L  CBC with Differential/Platelet  Result Value Ref Range   WBC 9.6 3.8 - 10.8 K/uL   RBC 4.61 3.80 - 5.10 MIL/uL   Hemoglobin 13.2 11.7 - 15.5 g/dL   HCT 39.3 35.0 - 45.0 %   MCV 85.2 80.0 - 100.0 fL   MCH 28.6 27.0 - 33.0 pg   MCHC 33.6 32.0 - 36.0 g/dL   RDW 13.3 11.0 - 15.0 %   Platelets 272 140 - 400 K/uL   MPV 12.0 7.5 - 12.5 fL   Neutro Abs 6,624 1,500 - 7,800 cells/uL   Lymphs Abs 2,016 850 - 3,900 cells/uL   Monocytes  Absolute 768 200 - 950 cells/uL   Eosinophils Absolute 192 15 - 500 cells/uL   Basophils Absolute 0 0 - 200 cells/uL   Neutrophils Relative % 69 %   Lymphocytes Relative 21 %   Monocytes Relative 8 %   Eosinophils Relative 2 %   Basophils Relative 0 %   Smear Review Criteria for review not met   COMPLETE METABOLIC PANEL WITH GFR  Result Value Ref Range   Sodium 144 135 - 146 mmol/L   Potassium 4.5 3.5 - 5.3 mmol/L   Chloride 106 98 - 110 mmol/L   CO2 26 20 - 31 mmol/L   Glucose, Bld 82 65 - 99 mg/dL   BUN 15 7 - 25 mg/dL   Creat 0.67 0.50 - 1.05 mg/dL   Total Bilirubin 0.5 0.2 - 1.2 mg/dL   Alkaline Phosphatase 51 33 - 130 U/L   AST 15 10 - 35 U/L   ALT 18 6 - 29 U/L   Total Protein 6.6 6.1 - 8.1 g/dL   Albumin 4.5 3.6 - 5.1 g/dL   Calcium 9.5 8.6 - 10.4 mg/dL   GFR, Est African American >89 >=60 mL/min   GFR, Est Non African American >89 >=60 mL/min  Hemoglobin A1c  Result Value Ref Range   Hgb A1c MFr Bld 5.5 <5.7 %   Mean Plasma Glucose 111 mg/dL      Assessment & Plan:   Problem List Items Addressed This Visit      Other   Upper back pain, chronic - Primary   Relevant Medications   HYDROcodone-acetaminophen (NORCO/VICODIN) 5-325 MG tablet   Other Relevant Orders   DG Thoracic Spine W/Swimmers (Completed)   Ambulatory referral to Orthopedic Surgery   Neck pain, chronic   Relevant Medications   HYDROcodone-acetaminophen (NORCO/VICODIN) 5-325 MG tablet   gabapentin (NEURONTIN) 100 MG capsule   Other Relevant Orders   DG Cervical Spine Complete (Completed)   Ambulatory referral to Orthopedic Surgery    Other Visit Diagnoses   None.      Follow up plan: Return if symptoms worsen or fail to improve.  An after-visit summary was printed and given to the patient at [place].  Please see the patient instructions which may contain other information and recommendations beyond what is mentioned above in the assessment and plan.  Meds ordered this encounter    Medications  . HYDROcodone-acetaminophen (NORCO/VICODIN) 5-325  MG tablet    Sig: Take 1 tablet by mouth every 6 (six) hours as needed for moderate pain.    Dispense:  30 tablet    Refill:  0  . predniSONE (DELTASONE) 20 MG tablet    Sig: Take 3 pills daily on Fri and Sat, then 2 pills on Sun and Mon, and 1 pill on Tues and Wed; take with food; no other NSAIDs    Dispense:  12 tablet    Refill:  0  . gabapentin (NEURONTIN) 100 MG capsule    Sig: Take 1 capsule (100 mg total) by mouth at bedtime. X 3 nights, then one BID x 3 days, then one TID    Dispense:  90 capsule    Refill:  0    Orders Placed This Encounter  Procedures  . DG Cervical Spine Complete  . DG Thoracic Spine W/Swimmers  . Ambulatory referral to Orthopedic Surgery

## 2016-02-10 NOTE — Telephone Encounter (Signed)
Please place bone density order

## 2016-03-01 ENCOUNTER — Ambulatory Visit
Admission: RE | Admit: 2016-03-01 | Discharge: 2016-03-01 | Disposition: A | Discharge status: Home / Self Care | Payer: 59 | Source: Ambulatory Visit | Attending: Family Medicine | Admitting: Family Medicine

## 2016-03-01 DIAGNOSIS — Z780 Asymptomatic menopausal state: Secondary | ICD-10-CM | POA: Insufficient documentation

## 2016-03-01 DIAGNOSIS — M8580 Other specified disorders of bone density and structure, unspecified site: Secondary | ICD-10-CM | POA: Diagnosis not present

## 2016-04-09 ENCOUNTER — Telehealth: Payer: Self-pay | Admitting: Family Medicine

## 2016-04-09 DIAGNOSIS — G4700 Insomnia, unspecified: Secondary | ICD-10-CM

## 2016-04-09 NOTE — Telephone Encounter (Signed)
PLEASE ADVISE.

## 2016-04-10 DIAGNOSIS — G4700 Insomnia, unspecified: Secondary | ICD-10-CM | POA: Insufficient documentation

## 2016-04-10 NOTE — Telephone Encounter (Signed)
I'll recommend that she see a pulmonologist then, as she may have sleep apnea

## 2016-04-10 NOTE — Assessment & Plan Note (Signed)
Refer for consideration of sleep study

## 2016-04-11 NOTE — Telephone Encounter (Signed)
Left detailed voicemail

## 2016-04-19 ENCOUNTER — Ambulatory Visit: Payer: 59 | Admitting: Internal Medicine

## 2016-04-24 ENCOUNTER — Encounter: Payer: Self-pay | Admitting: Pulmonary Disease

## 2016-04-24 ENCOUNTER — Ambulatory Visit: Payer: 59 | Admitting: Family Medicine

## 2016-04-24 ENCOUNTER — Encounter (INDEPENDENT_AMBULATORY_CARE_PROVIDER_SITE_OTHER): Payer: Self-pay

## 2016-04-24 ENCOUNTER — Ambulatory Visit (INDEPENDENT_AMBULATORY_CARE_PROVIDER_SITE_OTHER): Payer: 59 | Admitting: Pulmonary Disease

## 2016-04-24 VITALS — BP 122/74 | HR 74 | Ht 63.5 in | Wt 154.0 lb

## 2016-04-24 DIAGNOSIS — R0600 Dyspnea, unspecified: Secondary | ICD-10-CM | POA: Diagnosis not present

## 2016-04-24 DIAGNOSIS — G4700 Insomnia, unspecified: Secondary | ICD-10-CM

## 2016-04-24 DIAGNOSIS — F411 Generalized anxiety disorder: Secondary | ICD-10-CM

## 2016-04-24 DIAGNOSIS — K219 Gastro-esophageal reflux disease without esophagitis: Secondary | ICD-10-CM

## 2016-04-24 MED ORDER — MIRTAZAPINE 15 MG PO TABS: 15.0000 mg | ORAL_TABLET | Freq: Every day | ORAL | 10 refills | Status: DC

## 2016-04-24 NOTE — Patient Instructions (Signed)
1) Work on sleep hygiene 2) Change Protonix to bedtime 3) Work on anxiety reducing measures - exercise, massage, meditation, etc 4) Remeron (mirtazapine) 15 mg nightly (hour before sleep)

## 2016-04-27 ENCOUNTER — Other Ambulatory Visit: Payer: Self-pay | Admitting: Family Medicine

## 2016-04-27 NOTE — Progress Notes (Signed)
PULMONARY CONSULT NOTE  Requesting MD/Service: Lada Date of initial consultation: 010/03/17 Reason for consultation: Insomnia  PT PROFILE: 54 F referred for insomnia of several months duration  HPI:  16 F referred for evaluation of difficulty falling asleep. This has been a problem of several months duration. She also reports difficulty "relaxing". She notes increased anxiety related to several personal stressors including stressors at work and with family. She reports muscle tension - especially related to her L shoulder, for which she is undergoing PT modalities. Lastly, she notes that her husband's snoring contributes to her difficulty falling asleep. Previously this was not a problem because she would usually fall asleep before he did. She denies snoring, gasping, AM HA. Also denies dyspnea, CP, fever, purulent sputum, hemoptysis, LE edema and calf tenderness. She does experience daytime hypersomnolence as a result of poor sleep at night. She also notes nocturnal reflux symptoms which sometimes contribute to difficulty falling asleep. She takes a PPI each morning. When asked whether she felt depressed, she was a little avoidant in answering but became slightly tearful.  Past Medical History:  Diagnosis Date  . Beat, premature ventricular 06/21/2015  . Bundle branch block   . Diverticulitis of colon 07/07/2009  . Dry eyes   . Essential (primary) hypertension 06/21/2015  . Fibromyalgia   . GERD (gastroesophageal reflux disease)   . History of kidney stones   . Hyperlipidemia   . Hypertension   . IFG (impaired fasting glucose) 01/24/2015  . OAB (overactive bladder)   . Osteoarthritis   . Osteopenia determined by x-ray 02/10/2016  . Recurrent UTI     Past Surgical History:  Procedure Laterality Date  . COLONOSCOPY WITH PROPOFOL N/A 11/11/2015   Procedure: COLONOSCOPY WITH PROPOFOL;  Surgeon: Lollie Sails, MD;  Location: [facility] ENDOSCOPY;  Service: Endoscopy;  Laterality: N/A;  .  TOTAL ABDOMINAL HYSTERECTOMY  Jan 2012   Complete, due to heavy bleeding and grape-fruit size fibroid     MEDICATIONS: I have reviewed all medications and confirmed regimen as documented  Social History   Social History  . Marital status: Married    Spouse name: N/A  . Number of children: N/A  . Years of education: N/A   Occupational History  . Not on file.   Social History Main Topics  . Smoking status: Never Smoker  . Smokeless tobacco: Never Used  . Alcohol use No  . Drug use: No  . Sexual activity: Yes    Birth control/ protection: None   Other Topics Concern  . Not on file   Social History Narrative  . No narrative on file    Family History  Problem Relation Age of Onset  . Hypertension Mother   . Cancer Mother     skin  . Heart disease Father     CHF  . Hypertension Father   . Stroke Father     mini strokes  . Cancer Father     skin  . Heart attack Father   . Thyroid disease Father   . Ovarian cancer Maternal Grandmother 66  . Cancer Maternal Grandmother   . Heart attack Maternal Grandfather   . Heart attack Paternal Grandfather   . Diabetes Maternal Uncle   . Thyroid disease Sister   . Breast cancer Neg Hx     ROS: No fever, myalgias/arthralgias, unexplained weight loss or weight gain No new focal weakness or sensory deficits No otalgia, hearing loss, visual changes, nasal and sinus symptoms, mouth and throat problems  No neck pain or adenopathy No abdominal pain, N/V/D, diarrhea, change in bowel pattern No dysuria, change in urinary pattern   Vitals:   04/24/16 1145  BP: 122/74  Pulse: 74  SpO2: 99%  Weight: 154 lb (69.9 kg)  Height: 5' 3.5" (1.613 m)     EXAM:  Gen: WDWN, No overt respiratory distress HEENT: NCAT, sclera white, oropharynx normal Neck: Supple without LAN, thyromegaly, JVD Lungs: breath sounds full, percussion: normal, No adventitious sounds Cardiovascular: RRR, no murmurs noted Abdomen: Soft, nontender, normal  BS Ext: without clubbing, cyanosis, edema Neuro: CNs grossly intact, motor and sensory intact Skin: Limited exam, no lesions noted  DATA:   BMP Latest Ref Rng & Units 01/20/2016 01/19/2015 12/30/2013  Glucose 65 - 99 mg/dL 82 105(H) 101(H)  BUN 7 - 25 mg/dL 15 9 13   Creatinine 0.50 - 1.05 mg/dL 0.67 0.71 0.73  BUN/Creat Ratio 9 - 23 - 13 -  Sodium 135 - 146 mmol/L 144 143 139  Potassium 3.5 - 5.3 mmol/L 4.5 4.4 3.7  Chloride 98 - 110 mmol/L 106 102 105  CO2 20 - 31 mmol/L 26 25 27   Calcium 8.6 - 10.4 mg/dL 9.5 10.1 9.0    CBC Latest Ref Rng & Units 01/20/2016 01/19/2015 12/30/2013  WBC 3.8 - 10.8 K/uL 9.6 8.7 11.4(H)  Hemoglobin 11.7 - 15.5 g/dL 13.2 - 14.2  Hematocrit 35.0 - 45.0 % 39.3 40.8 42.1  Platelets 140 - 400 K/uL 272 351 264    CXR:  No recent film  IMPRESSION:     ICD-9-CM ICD-10-CM   1. Insomnia, unspecified type 780.52 G47.00   2. Gastroesophageal reflux disease, esophagitis presence not specified 530.81 K21.9   3. Anxiety state 300.00 F41.1   4. Dyspnea, unspecified type 786.09 R06.00      PLAN:  1) Discussed sleep hygiene 2) Change Protonix to HS 3) Discussed anxiety reducing measures - exercise, massage, meditation, etc 4) Initiate Remeron (mirtazapine) 15 mg qHS 5) ROV 4-6 weeks  Merton Border, MD PCCM service Mobile [phone] Pager [phone] 04/27/2016

## 2016-04-28 NOTE — Telephone Encounter (Signed)
rx approved

## 2016-05-03 ENCOUNTER — Ambulatory Visit: Payer: 59 | Admitting: Family Medicine

## 2016-05-08 ENCOUNTER — Encounter: Payer: Self-pay | Admitting: Family Medicine

## 2016-05-08 ENCOUNTER — Ambulatory Visit (INDEPENDENT_AMBULATORY_CARE_PROVIDER_SITE_OTHER): Payer: 59 | Admitting: Family Medicine

## 2016-05-08 DIAGNOSIS — G8929 Other chronic pain: Secondary | ICD-10-CM

## 2016-05-08 DIAGNOSIS — M542 Cervicalgia: Secondary | ICD-10-CM

## 2016-05-08 DIAGNOSIS — G4700 Insomnia, unspecified: Secondary | ICD-10-CM | POA: Diagnosis not present

## 2016-05-08 DIAGNOSIS — Z23 Encounter for immunization: Secondary | ICD-10-CM | POA: Diagnosis not present

## 2016-05-08 NOTE — Assessment & Plan Note (Signed)
Counseling given re: flu vaccine; patient will consider; did not get today, but she may go on Friday afternoon and have done at local pharmacy

## 2016-05-08 NOTE — Patient Instructions (Addendum)
  Check out the [employer] about sleep [email or web address]://[email or web address]-salazar.[email or web address]  Sleep Scientist Warns Against Walking Through Life 'In An [place]'  Just call me if you'd like a referral to a neck specialist  ** patient did NOT receive flu shot -- AVS printed out when she was considering having it done here -- she did not receive it and will go to local pharmacy on Friday afternoon **

## 2016-05-08 NOTE — Assessment & Plan Note (Signed)
Going on for about 6 months; I will be glad to prescribe the remeron; try to limit caffeine and chocolate after noon; check out [employer]

## 2016-05-08 NOTE — Assessment & Plan Note (Signed)
Continue PT; I will be happy to refer to neck specialist if needed; work on ergonomics

## 2016-05-08 NOTE — Progress Notes (Signed)
BP 120/78   Pulse 93   Temp 99 F (37.2 C) (Oral)   Resp 14   Wt 157 lb (71.2 kg)   SpO2 97%   BMI 27.38 kg/m    Subjective:    Patient ID: Whitney Cooper, female    DOB: Jul 15, 1961, 55 y.o.   MRN: [id number]  HPI: Whitney Cooper is a 55 y.o. female  Chief Complaint  Patient presents with  . Follow-up   She was referred to a pulmonologist; he prescribed some medicine to help her sleep; started mirtazipine a few weeks ago; that is helping her sleep quantity and quality; he did not order a sleep study; doesn't snore, doesn't wake up at night; not gasping for air; more exhausted; no SI/HI; she is going to read a book called 8 minute meditation; he wanted her to take the pantoprazole at night; she has been taking it in the morning  He thought she might be depressed; she felt that she had the irritability b/c of her shoulder; yanking and pulling and burning for month; she has been going to PT for her neck and back; they gave her pain medicine, hydrocodone, but only took for a few days; couldn't work on that with flexeril; quit taking those now; had dry needling and that has really helped, Dr. 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 Alva Garnet told her to walk every day; she wants to do exercises before doing more (open invite to specialist)  Depression screen [facility] 2/9 05/08/2016 01/20/2016 01/19/2015  Decreased Interest 0 0 0  Down, Depressed, Hopeless 0 0 0  PHQ - 2 Score 0 0 0   Relevant past medical, surgical, family and social history reviewed Past Medical History:  Diagnosis Date  .  Beat, premature ventricular 06/21/2015  . Bundle branch block   . Diverticulitis of colon 07/07/2009  . Dry eyes   . Essential (primary) hypertension 06/21/2015  . Fibromyalgia   . GERD (gastroesophageal reflux disease)   . History of kidney stones   . Hyperlipidemia   . Hypertension   . IFG (impaired fasting glucose) 01/24/2015  . OAB (overactive bladder)   . Osteoarthritis   . Osteopenia determined by x-ray 02/10/2016  . Recurrent UTI    Past Surgical History:  Procedure Laterality Date  . COLONOSCOPY WITH PROPOFOL N/A 11/11/2015   Procedure: COLONOSCOPY WITH PROPOFOL;  Surgeon: Lollie Sails, MD;  Location: [facility] ENDOSCOPY;  Service: Endoscopy;  Laterality: N/A;  . TOTAL ABDOMINAL HYSTERECTOMY  Jan 2012   Complete, due to heavy bleeding and grape-fruit size fibroid    Family History  Problem Relation Age of Onset  . Hypertension Mother   . Cancer Mother     skin  . Heart disease Father     CHF  . Hypertension Father   . Stroke Father     mini strokes  . Cancer Father     skin  . Heart attack Father   . Thyroid disease Father   . Ovarian cancer Maternal Grandmother  31  . Cancer Maternal Grandmother   . Heart attack Maternal Grandfather   . Heart attack Paternal Grandfather   . Diabetes Maternal Uncle   . Thyroid disease Sister   . Breast cancer Neg Hx    Social History  Substance Use Topics  . Smoking status: Never Smoker  . Smokeless tobacco: Never Used  . Alcohol use No   Interim medical history since last visit reviewed. Allergies and medications reviewed  Review of Systems Per HPI unless specifically indicated above     Objective:    BP 120/78   Pulse 93   Temp 99 F (37.2 C) (Oral)   Resp 14   Wt 157 lb (71.2 kg)   SpO2 97%   BMI 27.38 kg/m   Wt Readings from Last 3 Encounters:  05/08/16 157 lb (71.2 kg)  04/24/16 154 lb (69.9 kg)  02/10/16 153 lb 4.8 oz (69.5 kg)    Physical Exam  Constitutional: She appears well-developed and  well-nourished. No distress.  Eyes: EOM are normal. No scleral icterus.  Neck: No thyromegaly present.  Cardiovascular: Normal rate.   Pulmonary/Chest: Effort normal.  Abdominal: She exhibits no distension.  Neurological: She displays no tremor. Gait normal.  Skin: No pallor.  Psychiatric: She has a normal mood and affect. Her behavior is normal. Judgment and thought content normal. Her mood appears not anxious. Her affect is not blunt. Her speech is not delayed. She is not slowed. She does not exhibit a depressed mood. She expresses no homicidal and no suicidal ideation. She is attentive.      Assessment & Plan:   Problem List Items Addressed This Visit      Other   Neck pain, chronic    Continue PT; I will be happy to refer to neck specialist if needed; work on ergonomics      Insomnia    Going on for about 6 months; I will be glad to prescribe the remeron; try to limit caffeine and chocolate after noon; check out [employer]      Flu vaccine need    Counseling given re: flu vaccine; patient will consider; did not get today, but she may go on Friday afternoon and have done at local pharmacy       Other Visit Diagnoses   None.     Follow up plan: Return if symptoms worsen or fail to improve.  An after-visit summary was printed and given to the patient at [facility].  Please see the patient instructions which may contain other information and recommendations beyond what is mentioned above in the assessment and plan.

## 2016-06-01 ENCOUNTER — Ambulatory Visit: Payer: 59 | Admitting: Pulmonary Disease

## 2016-06-18 ENCOUNTER — Encounter: Payer: Self-pay | Admitting: Pulmonary Disease

## 2016-06-18 ENCOUNTER — Ambulatory Visit (INDEPENDENT_AMBULATORY_CARE_PROVIDER_SITE_OTHER): Payer: 59 | Admitting: Pulmonary Disease

## 2016-06-18 VITALS — BP 126/74 | HR 97 | Ht 63.5 in | Wt 163.4 lb

## 2016-06-18 DIAGNOSIS — F32A Depression, unspecified: Secondary | ICD-10-CM

## 2016-06-18 DIAGNOSIS — F411 Generalized anxiety disorder: Secondary | ICD-10-CM | POA: Diagnosis not present

## 2016-06-18 DIAGNOSIS — G4700 Insomnia, unspecified: Secondary | ICD-10-CM | POA: Diagnosis not present

## 2016-06-18 DIAGNOSIS — F329 Major depressive disorder, single episode, unspecified: Secondary | ICD-10-CM | POA: Diagnosis not present

## 2016-06-18 NOTE — Patient Instructions (Addendum)
Continue Remeron each night and Protonix @ night  8 minute meditation is a good book for you to check out  Follow up as needed  Happy Holidays

## 2016-06-18 NOTE — Progress Notes (Signed)
PULMONARY OFFICE FOLLOW UP NOTE  Requesting MD/Service: Lada Date of initial consultation: 010/03/17 Reason for consultation: Insomnia  PT PROFILE: 71 F referred for insomnia of several months duration  SUBJ:  No new complaints. Remeron has helped her insomnia. Reports more vivid dreams but nothing disturbing. No adverse effects. Also working with meditation/mindfulness maneuvers. She is satisfied with results.  OBJ:   Vitals:   06/18/16 0840  BP: 126/74  Pulse: 97  SpO2: 98%  Weight: 163 lb 6.4 oz (74.1 kg)  Height: 5' 3.5" (1.613 m)     EXAM:  Gen: NAD HEENT: NCAT, sclera white, oropharynx normal Lungs: breath sounds full, percussion: normal Cardiovascular: RRR, no murmurs Abdomen: Soft, nontender, normal BS Ext: without clubbing, cyanosis, edema Neuro: grossly intact  DATA:  No new labs or CXR   IMPRESSION:     ICD-9-CM ICD-10-CM   1. Insomnia, unspecified type 780.52 G47.00   2. Anxiety state 300.00 F41.1   3. Depression, unspecified depression type 311 F32.9    1) Insomnia due to anxiety and depression - much improved 2) GERD - nocturnal symptoms are improved with changing PPI to dinner time  PLAN:  1) Cont Remeron for now. We discussed the possible adverse consequences of stopping Remeron abruptly (even @ this low dose). I encouraged that, if/when she desires to try off of it, she seek medical guidance re: tapering off 2) Follow up as needed  Merton Border, MD PCCM service Mobile [phone] Pager [phone] 06/18/2016

## 2016-07-20 ENCOUNTER — Encounter: Payer: Self-pay | Admitting: Family Medicine

## 2016-07-20 ENCOUNTER — Ambulatory Visit (INDEPENDENT_AMBULATORY_CARE_PROVIDER_SITE_OTHER): Payer: 59 | Admitting: Family Medicine

## 2016-07-20 DIAGNOSIS — Z5181 Encounter for therapeutic drug level monitoring: Secondary | ICD-10-CM

## 2016-07-20 DIAGNOSIS — K219 Gastro-esophageal reflux disease without esophagitis: Secondary | ICD-10-CM

## 2016-07-20 DIAGNOSIS — E663 Overweight: Secondary | ICD-10-CM | POA: Diagnosis not present

## 2016-07-20 DIAGNOSIS — R7301 Impaired fasting glucose: Secondary | ICD-10-CM | POA: Diagnosis not present

## 2016-07-20 DIAGNOSIS — E782 Mixed hyperlipidemia: Secondary | ICD-10-CM

## 2016-07-20 DIAGNOSIS — I10 Essential (primary) hypertension: Secondary | ICD-10-CM

## 2016-07-20 LAB — CBC WITH DIFFERENTIAL/PLATELET
BASOS ABS: 0 {cells}/uL (ref 0–200)
BASOS PCT: 0 %
EOS ABS: 255 {cells}/uL (ref 15–500)
Eosinophils Relative: 3 %
HEMATOCRIT: 41.9 % (ref 35.0–45.0)
Hemoglobin: 14 g/dL (ref 11.7–15.5)
LYMPHS PCT: 24 %
Lymphs Abs: 2040 cells/uL (ref 850–3900)
MCH: 29.1 pg (ref 27.0–33.0)
MCHC: 33.4 g/dL (ref 32.0–36.0)
MCV: 87.1 fL (ref 80.0–100.0)
MONO ABS: 765 {cells}/uL (ref 200–950)
MPV: 11.1 fL (ref 7.5–12.5)
Monocytes Relative: 9 %
Neutro Abs: 5440 cells/uL (ref 1500–7800)
Neutrophils Relative %: 64 %
Platelets: 260 10*3/uL (ref 140–400)
RBC: 4.81 MIL/uL (ref 3.80–5.10)
RDW: 13 % (ref 11.0–15.0)
WBC: 8.5 10*3/uL (ref 3.8–10.8)

## 2016-07-20 LAB — COMPLETE METABOLIC PANEL WITH GFR
ALT: 21 U/L (ref 6–29)
AST: 19 U/L (ref 10–35)
Albumin: 4.4 g/dL (ref 3.6–5.1)
Alkaline Phosphatase: 59 U/L (ref 33–130)
BUN: 13 mg/dL (ref 7–25)
CO2: 28 mmol/L (ref 20–31)
Calcium: 9.7 mg/dL (ref 8.6–10.4)
Chloride: 107 mmol/L (ref 98–110)
Creat: 0.7 mg/dL (ref 0.50–1.05)
GFR, Est African American: >89 mL/min (ref >=60)
GLUCOSE: 103 mg/dL — AB (ref 65–99)
POTASSIUM: 5.1 mmol/L (ref 3.5–5.3)
SODIUM: 143 mmol/L (ref 135–146)
TOTAL PROTEIN: 6.9 g/dL (ref 6.1–8.1)
Total Bilirubin: 0.5 mg/dL (ref 0.2–1.2)

## 2016-07-20 LAB — LIPID PANEL
CHOL/HDL RATIO: 4.8 ratio (ref <5.0)
Cholesterol: 221 mg/dL — ABNORMAL HIGH (ref <200)
HDL: 46 mg/dL — AB (ref >50)
LDL CALC: 129 mg/dL — AB (ref <100)
Triglycerides: 228 mg/dL — ABNORMAL HIGH (ref <150)
VLDL: 46 mg/dL — ABNORMAL HIGH (ref <30)

## 2016-07-20 MED ORDER — RANITIDINE HCL 300 MG PO TABS: 300.0000 mg | ORAL_TABLET | Freq: Every day | ORAL | 5 refills | Status: DC

## 2016-07-20 NOTE — Progress Notes (Signed)
BP 122/74   Pulse 96   Temp 98.8 F (37.1 C) (Oral)   Resp 14   Wt 169 lb (76.7 kg)   SpO2 95%   BMI 29.47 kg/m    Subjective:    Patient ID: Whitney Cooper, female    DOB: [birth year], 55 y.o.   MRN: [id number]  HPI: Whitney Cooper is a 55 y.o. female  Chief Complaint  Patient presents with  . Follow-up   She is here for regular follow-up She has gained some weight since last visit; she had changed her eating habits, but then started eating junk; she and her husband are going to change their eating habits; their church has a gym HTN; well-controlled; she checks her wrist BP; she writes it down; controlled; avoids decongestants; rare black licorice; not using much salt High cholesterol; taking statin, no side effects; maybe two eggs a week GERD; taking PPI; bothers her during the day; watches what she eats; pizza is a trigger; ice cream upsets her stomach; she read about risks Back issues; did PT for a while; lots of degeneration in her neck; doing some exercises; she saw an orthopaedist  Depression screen [facility] 2/9 07/20/2016 05/08/2016 01/20/2016 01/19/2015  Decreased Interest 0 0 0 0  Down, Depressed, Hopeless 0 0 0 0  PHQ - 2 Score 0 0 0 0   Relevant past medical, surgical, family and social history reviewed Past Medical History:  Diagnosis Date  . Beat, premature ventricular 06/21/2015  . Bundle branch block   . Diverticulitis of colon 07/07/2009  . Dry eyes   . Essential (primary) hypertension 06/21/2015  . Fibromyalgia   . GERD (gastroesophageal reflux disease)   . History of kidney stones   . Hyperlipidemia   . Hypertension   . IFG (impaired fasting glucose) 01/24/2015  . OAB (overactive bladder)   . Osteoarthritis   . Osteopenia determined by x-ray 02/10/2016  . Recurrent UTI    Past Surgical History:  Procedure Laterality Date  . COLONOSCOPY WITH PROPOFOL N/A 11/11/2015   Procedure: COLONOSCOPY WITH PROPOFOL;  Surgeon: Lollie Sails, MD;  Location: [facility]  ENDOSCOPY;  Service: Endoscopy;  Laterality: N/A;  . TOTAL ABDOMINAL HYSTERECTOMY  Jan 2012   Complete, due to heavy bleeding and grape-fruit size fibroid    Family History  Problem Relation Age of Onset  . Hypertension Mother   . Cancer Mother     skin  . Heart disease Father     CHF  . Hypertension Father   . Stroke Father     mini strokes  . Cancer Father     skin  . Heart attack Father   . Thyroid disease Father   . Ovarian cancer Maternal Grandmother 66  . Cancer Maternal Grandmother   . Heart attack Maternal Grandfather   . Heart attack Paternal Grandfather   . Diabetes Maternal Uncle   . Thyroid disease Sister   . Breast cancer Neg Hx    Social History  Substance Use Topics  . Smoking status: Never Smoker  . Smokeless tobacco: Never Used  . Alcohol use No    Interim medical history since last visit reviewed. Allergies and medications reviewed  Review of Systems Per HPI unless specifically indicated above     Objective:    BP 122/74   Pulse 96   Temp 98.8 F (37.1 C) (Oral)   Resp 14   Wt 169 lb (76.7 kg)   SpO2 95%   BMI 29.47 kg/m  Wt Readings from Last 3 Encounters:  07/20/16 169 lb (76.7 kg)  06/18/16 163 lb 6.4 oz (74.1 kg)  05/08/16 157 lb (71.2 kg)    Physical Exam  Constitutional: She appears well-developed and well-nourished. No distress.  HENT:  Head: Normocephalic and atraumatic.  Eyes: EOM are normal. No scleral icterus.  Neck: No thyromegaly present.  Cardiovascular: Normal rate, regular rhythm and normal heart sounds.   No murmur heard. Pulmonary/Chest: Effort normal and breath sounds normal.  Abdominal: Soft. Bowel sounds are normal. She exhibits no distension.  Musculoskeletal: Normal range of motion. She exhibits no edema.  Neurological: She is alert. She exhibits normal muscle tone.  Skin: Skin is warm and dry. She is not diaphoretic. No pallor.  Psychiatric: She has a normal mood and affect. Her behavior is normal.  Judgment and thought content normal.   Results for orders placed or performed in visit on 01/20/16  Hepatitis C Antibody  Result Value Ref Range   HCV Ab NEGATIVE NEGATIVE  HIV antibody  Result Value Ref Range   HIV 1&2 Ab, 4th Generation NONREACTIVE NONREACTIVE  TSH  Result Value Ref Range   TSH 2.68 mIU/L  CBC with Differential/Platelet  Result Value Ref Range   WBC 9.6 3.8 - 10.8 K/uL   RBC 4.61 3.80 - 5.10 MIL/uL   Hemoglobin 13.2 11.7 - 15.5 g/dL   HCT 39.3 35.0 - 45.0 %   MCV 85.2 80.0 - 100.0 fL   MCH 28.6 27.0 - 33.0 pg   MCHC 33.6 32.0 - 36.0 g/dL   RDW 13.3 11.0 - 15.0 %   Platelets 272 140 - 400 K/uL   MPV 12.0 7.5 - 12.5 fL   Neutro Abs 6,624 1,500 - 7,800 cells/uL   Lymphs Abs 2,016 850 - 3,900 cells/uL   Monocytes Absolute 768 200 - 950 cells/uL   Eosinophils Absolute 192 15 - 500 cells/uL   Basophils Absolute 0 0 - 200 cells/uL   Neutrophils Relative % 69 %   Lymphocytes Relative 21 %   Monocytes Relative 8 %   Eosinophils Relative 2 %   Basophils Relative 0 %   Smear Review Criteria for review not met   COMPLETE METABOLIC PANEL WITH GFR  Result Value Ref Range   Sodium 144 135 - 146 mmol/L   Potassium 4.5 3.5 - 5.3 mmol/L   Chloride 106 98 - 110 mmol/L   CO2 26 20 - 31 mmol/L   Glucose, Bld 82 65 - 99 mg/dL   BUN 15 7 - 25 mg/dL   Creat 0.67 0.50 - 1.05 mg/dL   Total Bilirubin 0.5 0.2 - 1.2 mg/dL   Alkaline Phosphatase 51 33 - 130 U/L   AST 15 10 - 35 U/L   ALT 18 6 - 29 U/L   Total Protein 6.6 6.1 - 8.1 g/dL   Albumin 4.5 3.6 - 5.1 g/dL   Calcium 9.5 8.6 - 10.4 mg/dL   GFR, Est African American >89 >=60 mL/min   GFR, Est Non African American >89 >=60 mL/min  Hemoglobin A1c  Result Value Ref Range   Hgb A1c MFr Bld 5.5 <5.7 %   Mean Plasma Glucose 111 mg/dL      Assessment & Plan:   Problem List Items Addressed This Visit      Cardiovascular and Mediastinum   Essential (primary) hypertension (Chronic)    Well-controlled, avoid  decongestnats; monitor kidney function; try to follow the DASH guidelines        Digestive  GERD (gastroesophageal reflux disease) (Chronic)    Avoid triggers; discussed risks      Relevant Medications   ranitidine (ZANTAC) 300 MG tablet     Endocrine   IFG (impaired fasting glucose) (Chronic)    Avoid whites; check A1c; healthier diet in the new year      Relevant Orders   Hemoglobin A1c     Other   Overweight (BMI 25.0-29.9)    She's going to work on weight loss and healthier eating in the new year; encouragement provided      Medication monitoring encounter    Check sgpt and renal function      Relevant Orders   CBC with Differential/Platelet   COMPLETE METABOLIC PANEL WITH GFR   Hyperlipidemia (Chronic)    Limit saturated fats; continue statins; she's going to work on diet and weight loss      Relevant Orders   Lipid panel      She declined flu shot today  Follow up plan: Return in about 6 months (around 01/18/2017) for follow-up and fasting labs.  An after-visit summary was printed and given to the patient at [place].  Please see the patient instructions which may contain other information and recommendations beyond what is mentioned above in the assessment and plan.  Meds ordered this encounter  Medications  . ranitidine (ZANTAC) 300 MG tablet    Sig: Take 1 tablet (300 mg total) by mouth at bedtime. For heartburn    Dispense:  30 tablet    Refill:  5    Orders Placed This Encounter  Procedures  . Hemoglobin A1c  . CBC with Differential/Platelet  . Lipid panel  . COMPLETE METABOLIC PANEL WITH GFR

## 2016-07-20 NOTE — Assessment & Plan Note (Signed)
Avoid triggers; discussed risks

## 2016-07-20 NOTE — Assessment & Plan Note (Signed)
Limit saturated fats; continue statins; she's going to work on diet and weight loss

## 2016-07-20 NOTE — Assessment & Plan Note (Signed)
Check sgpt and renal function

## 2016-07-20 NOTE — Assessment & Plan Note (Signed)
She's going to work on weight loss and healthier eating in the new year; encouragement provided

## 2016-07-20 NOTE — Patient Instructions (Addendum)
Try to limit saturated fats in your diet (bologna, hot dogs, barbeque, cheeseburgers, hamburgers, steak, bacon, sausage, cheese, etc.) and get more fresh fruits, vegetables, and whole grains Your goal blood pressure is less than 140 mmHg on top. Try to follow the DASH guidelines (DASH stands for Dietary Approaches to Stop Hypertension) Try to limit the sodium in your diet.  Ideally, consume less than 1.5 grams (less than 1,500mg ) per day. Do not add salt when cooking or at the table.  Check the sodium amount on labels when shopping, and choose items lower in sodium when given a choice. Avoid or limit foods that already contain a lot of sodium. Eat a diet rich in fruits and vegetables and whole grains. Good luck with your healthy lifestyle changes Caution: prolonged use of proton pump inhibitors like omeprazole (Prilosec), pantoprazole (Protonix), esomeprazole (Nexium), and others like Dexilant and Aciphex may increase your risk of pneumonia, Clostridium difficile colitis, osteoporosis, anemia and other health complications Try to limit or avoid triggers like coffee, caffeinated beverages, onions, chocolate, spicy foods, peppermint, acid foods like pizza, spaghetti sauce, and orange juice Lose weight if you are overweight or obese Try elevating the head of your bed by placing a small wedge between your mattress and box springs to keep acid in the stomach at night instead of coming up into your esophagus

## 2016-07-20 NOTE — Assessment & Plan Note (Signed)
Avoid whites; check A1c; healthier diet in the new year

## 2016-07-20 NOTE — Assessment & Plan Note (Signed)
Well-controlled, avoid decongestnats; monitor kidney function; try to follow the DASH guidelines

## 2016-07-21 LAB — HEMOGLOBIN A1C
HEMOGLOBIN A1C: 5.4 % (ref <5.7)
Mean Plasma Glucose: 108 mg/dL

## 2016-07-27 ENCOUNTER — Other Ambulatory Visit: Payer: Self-pay | Admitting: Family Medicine

## 2016-07-27 DIAGNOSIS — E782 Mixed hyperlipidemia: Secondary | ICD-10-CM

## 2016-07-27 DIAGNOSIS — Z5181 Encounter for therapeutic drug level monitoring: Secondary | ICD-10-CM

## 2016-07-27 MED ORDER — ATORVASTATIN CALCIUM 40 MG PO TABS: 40.0000 mg | ORAL_TABLET | Freq: Every day | ORAL | 1 refills | Status: DC

## 2016-07-27 NOTE — Progress Notes (Signed)
Increase statin dose; recheck labs in 6 weeks

## 2016-07-27 NOTE — Assessment & Plan Note (Signed)
Check sgpt in 6 weeks 

## 2016-07-27 NOTE — Assessment & Plan Note (Signed)
Increase statin; recheck labs in 6 weeks 

## 2016-09-04 ENCOUNTER — Other Ambulatory Visit: Payer: Self-pay

## 2016-09-04 DIAGNOSIS — H15102 Unspecified episcleritis, left eye: Secondary | ICD-10-CM | POA: Diagnosis not present

## 2016-09-04 DIAGNOSIS — E782 Mixed hyperlipidemia: Secondary | ICD-10-CM

## 2016-09-04 DIAGNOSIS — Z5181 Encounter for therapeutic drug level monitoring: Secondary | ICD-10-CM

## 2016-09-30 ENCOUNTER — Telehealth: Payer: Self-pay | Admitting: Family Medicine

## 2016-09-30 NOTE — Telephone Encounter (Signed)
Please remind patient to come in for fasting labs as soon as she can Thank you

## 2016-10-01 ENCOUNTER — Other Ambulatory Visit: Payer: Self-pay

## 2016-10-01 DIAGNOSIS — E785 Hyperlipidemia, unspecified: Secondary | ICD-10-CM

## 2016-10-01 DIAGNOSIS — Z5181 Encounter for therapeutic drug level monitoring: Secondary | ICD-10-CM

## 2016-10-02 ENCOUNTER — Encounter: Payer: Self-pay | Admitting: *Deleted

## 2016-10-02 ENCOUNTER — Ambulatory Visit
Admission: EM | Admit: 2016-10-02 | Discharge: 2016-10-02 | Disposition: A | Discharge status: Home / Self Care | Payer: 59 | Attending: Family Medicine | Admitting: Family Medicine

## 2016-10-02 DIAGNOSIS — R300 Dysuria: Secondary | ICD-10-CM | POA: Diagnosis not present

## 2016-10-02 LAB — URINALYSIS, COMPLETE (UACMP) WITH MICROSCOPIC
BACTERIA UA: NONE SEEN
Bilirubin Urine: NEGATIVE
GLUCOSE, UA: 100 mg/dL — AB
HGB URINE DIPSTICK: NEGATIVE
Ketones, ur: NEGATIVE mg/dL
Leukocytes, UA: NEGATIVE
Nitrite: POSITIVE — AB
PH: 6 (ref 5.0–8.0)
Protein, ur: NEGATIVE mg/dL
RBC / HPF: NONE SEEN RBC/hpf (ref 0–5)

## 2016-10-02 MED ORDER — CEPHALEXIN 500 MG PO CAPS: 500.0000 mg | ORAL_CAPSULE | Freq: Two times a day (BID) | ORAL | 0 refills | Status: AC

## 2016-10-02 NOTE — Discharge Instructions (Signed)
Take medication as prescribed. Rest. Drink plenty of fluids.  ° °Follow up with your primary care physician this week as needed. Return to Urgent care for new or worsening concerns.  ° °

## 2016-10-02 NOTE — ED Triage Notes (Signed)
Patient started having symptoms of urinary urgency, and burning on urination 2 days ago.

## 2016-10-02 NOTE — ED Provider Notes (Signed)
[facility] ____________________________________________  Time seen: Approximately 1:39 PM  I have reviewed the triage vital signs and the nursing notes.   HISTORY  Chief Complaint Urinary Frequency and Pelvic Pain   HPI Whitney Cooper is a 56 y.o. female presents with complaints of 2 days of urinary frequency, urinary urgency and some burning with urination. Patient reports she did take over-the-counter Azo with mild improvement of symptoms, but the urgency is still continue. Patient reports that she has had a history of urinary tract infections with similar presentation, states no infection last year. Denies any fever, nausea, vomiting or diarrhea. Reports sexual intercourse 2 days prior to symptom onset, suspect associated trigger. Reports otherwise feels well. Patient presents that she has had a history of some urinary burning sensation in absence of the UTI, but reports she has never had frequency or urgency associated with non-UTI symptoms. Denies vaginal discharge, vaginal or pelvic pain, or vaginal complaints.   Denies chest pain, shortness of breath, abdominal pain, extremity pain, extremity swelling or rash. Denies recent sickness. Denies recent antibiotic use.   Enid Derry, MD: PCP   Past Medical History:  Diagnosis Date  . Beat, premature ventricular 06/21/2015  . Bundle branch block   . Diverticulitis of colon 07/07/2009  . Dry eyes   . Essential (primary) hypertension 06/21/2015  . Fibromyalgia   . GERD (gastroesophageal reflux disease)   . History of kidney stones   . Hyperlipidemia   . Hypertension   . IFG (impaired fasting glucose) 01/24/2015  . OAB (overactive bladder)   . Osteoarthritis   . Osteopenia determined by x-ray 02/10/2016  . Recurrent UTI     Patient Active Problem List   Diagnosis Date Noted  . Insomnia 04/10/2016  . Upper back pain, chronic 02/10/2016  . Neck pain, chronic 02/10/2016  . Osteopenia determined by x-ray 02/10/2016    . Preventative health care 01/20/2016  . Medication monitoring encounter 11/15/2015  . Essential (primary) hypertension 06/21/2015  . Combined fat and carbohydrate induced hyperlipemia 06/21/2015  . Beat, premature ventricular 06/21/2015  . Urinary frequency 03/15/2015  . Strangury 03/15/2015  . Incontinence 03/15/2015  . Right flank pain 03/15/2015  . Atrophic vaginitis 03/15/2015  . Abnormal thyroid function test 01/24/2015  . IFG (impaired fasting glucose) 01/24/2015  . Overweight (BMI 25.0-29.9) 01/19/2015  . Urinary retention with incomplete bladder emptying 01/19/2015  . Hyperlipidemia   . Osteoarthritis   . GERD (gastroesophageal reflux disease)   . Fibromyalgia   . Diverticulitis of colon 07/07/2009    Past Surgical History:  Procedure Laterality Date  . COLONOSCOPY WITH PROPOFOL N/A 11/11/2015   Procedure: COLONOSCOPY WITH PROPOFOL;  Surgeon: Lollie Sails, MD;  Location: [facility] ENDOSCOPY;  Service: Endoscopy;  Laterality: N/A;  . TOTAL ABDOMINAL HYSTERECTOMY  Jan 2012   Complete, due to heavy bleeding and grape-fruit size fibroid      No current facility-administered medications for this encounter.   Current Outpatient Prescriptions:  .  atorvastatin (LIPITOR) 40 MG tablet, Take 1 tablet (40 mg total) by mouth at bedtime., Disp: 30 tablet, Rfl: 1 .  Cholecalciferol (VITAMIN D3) 1000 units CAPS, Take 1 capsule (1,000 Units total) by mouth daily., Disp: , Rfl:  .  Coenzyme Q10 (CO Q-10) 100 MG CAPS, Take by mouth daily. , Disp: , Rfl:  .  metoprolol succinate (TOPROL-XL) 50 MG 24 hr tablet, Take 1 tablet (50 mg total) by mouth daily., Disp: 30 tablet, Rfl: 11 .  mirtazapine (REMERON) 15 MG tablet,  Take 1 tablet (15 mg total) by mouth at bedtime., Disp: 30 tablet, Rfl: 10 .  Multiple Vitamin (MULTI-VITAMINS) TABS, Take by mouth daily., Disp: , Rfl:  .  pantoprazole (PROTONIX) 40 MG tablet, Take 40 mg by mouth daily., Disp: , Rfl:  .  cephALEXin (KEFLEX) 500 MG  capsule, Take 1 capsule (500 mg total) by mouth 2 (two) times daily., Disp: 14 capsule, Rfl: 0 .  ranitidine (ZANTAC) 300 MG tablet, Take 1 tablet (300 mg total) by mouth at bedtime. For heartburn, Disp: 30 tablet, Rfl: 5  Allergies Codeine and Sulfa antibiotics  Family History  Problem Relation Age of Onset  . Hypertension Mother   . Cancer Mother     skin  . Heart disease Father     CHF  . Hypertension Father   . Stroke Father     mini strokes  . Cancer Father     skin  . Heart attack Father   . Thyroid disease Father   . Ovarian cancer Maternal Grandmother 66  . Cancer Maternal Grandmother   . Heart attack Maternal Grandfather   . Heart attack Paternal Grandfather   . Diabetes Maternal Uncle   . Thyroid disease Sister   . Breast cancer Neg Hx     Social History Social History  Substance Use Topics  . Smoking status: Never Smoker  . Smokeless tobacco: Never Used  . Alcohol use No    Review of Systems Constitutional: No fever/chills Eyes: No visual changes. ENT: No sore throat. Cardiovascular: Denies chest pain. Respiratory: Denies shortness of breath. Gastrointestinal: No abdominal pain.  No nausea, no vomiting.  No diarrhea.  No constipation. Genitourinary: Positive for dysuria. Musculoskeletal: Negative for back pain. Skin: Negative for rash. Neurological: Negative for headaches, focal weakness or numbness.  10-point ROS otherwise negative.  ____________________________________________   PHYSICAL EXAM:  VITAL SIGNS: ED Triage Vitals  Enc Vitals Group     BP 10/02/16 1231 133/69     Pulse Rate 10/02/16 1231 77     Resp 10/02/16 1231 16     Temp 10/02/16 1231 97.9 F (36.6 C)     Temp Source 10/02/16 1231 Oral     SpO2 10/02/16 1231 97 %     Weight 10/02/16 1233 166 lb (75.3 kg)     Height 10/02/16 1233 5\' 4"  (1.626 m)     Head Circumference --      Peak Flow --      Pain Score 10/02/16 1235 8     Pain Loc --      Pain Edu? --      Excl. in  [place]? --     Constitutional: Alert and oriented. Well appearing and in no acute distress. Eyes: Conjunctivae are normal. PERRL. EOMI. ENT      Head: Normocephalic and atraumatic. Cardiovascular: Normal rate, regular rhythm. Grossly normal heart sounds.  Good peripheral circulation. Respiratory: Normal respiratory effort without tachypnea nor retractions. Breath sounds are clear and equal bilaterally. No wheezes, rales, rhonchi. Gastrointestinal: Soft and nontender. No distention. No CVA tenderness. Musculoskeletal: No midline cervical, thoracic or lumbar tenderness to palpation.  Neurologic:  Normal speech and language. Speech is normal. No gait instability.  Skin:  Skin is warm, dry and intact. No rash noted. Psychiatric: Mood and affect are normal. Speech and behavior are normal. Patient exhibits appropriate insight and judgment   ___________________________________________   LABS (all labs ordered are listed, but only abnormal results are displayed)  Labs Reviewed  URINALYSIS, COMPLETE (  UACMP) WITH MICROSCOPIC - Abnormal; Notable for the following:       Result Value   Color, Urine ORANGE (*)    Specific Gravity, Urine <1.005 (*)    Glucose, UA 100 (*)    Nitrite POSITIVE (*)    Squamous Epithelial / LPF 0-5 (*)    All other components within normal limits  URINE CULTURE     PROCEDURES Procedures    INITIAL IMPRESSION / ASSESSMENT AND PLAN / ED COURSE  Pertinent labs & imaging results that were available during my care of the patient were reviewed by me and considered in my medical decision making (see chart for details).  Well-appearing patient. No acute distress. Dysuria symptoms 2 days. Urinalysis reviewed. Discussed in detail with patient not clear UTI. Bacteria negative. Nitrite positive. Discussed the patient will start oral cephalexin and await urine culture. Encouraged supportive care, rest, fluids. Discussed indication, risks and benefits of medications with  patient.  Discussed follow up with Primary care physician this week. Discussed follow up and return parameters including no resolution or any worsening concerns. Patient verbalized understanding and agreed to plan.   ____________________________________________   FINAL CLINICAL IMPRESSION(S) / ED DIAGNOSES  Final diagnoses:  Dysuria     Discharge Medication List as of 10/02/2016  1:51 PM    START taking these medications   Details  cephALEXin (KEFLEX) 500 MG capsule Take 1 capsule (500 mg total) by mouth 2 (two) times daily., Starting Tue 10/02/2016, Until Tue 10/09/2016, Normal        Note: This dictation was prepared with Dragon dictation along with smaller phrase technology. Any transcriptional errors that result from this process are unintentional.         Marylene Land, NP 10/02/16 1505

## 2016-10-03 LAB — URINE CULTURE

## 2016-10-03 NOTE — Telephone Encounter (Signed)
Patient notified is coming Friday morning will go to [place].

## 2016-10-05 DIAGNOSIS — E785 Hyperlipidemia, unspecified: Secondary | ICD-10-CM | POA: Diagnosis not present

## 2016-10-05 DIAGNOSIS — Z5181 Encounter for therapeutic drug level monitoring: Secondary | ICD-10-CM | POA: Diagnosis not present

## 2016-10-06 LAB — LIPID PANEL
CHOLESTEROL TOTAL: 199 mg/dL (ref 100–199)
Chol/HDL Ratio: 4.9 ratio units — ABNORMAL HIGH (ref 0.0–4.4)
HDL: 41 mg/dL (ref >39)
LDL Calculated: 104 mg/dL — ABNORMAL HIGH (ref 0–99)
TRIGLYCERIDES: 271 mg/dL — AB (ref 0–149)
VLDL CHOLESTEROL CAL: 54 mg/dL — AB (ref 5–40)

## 2016-10-06 LAB — ALT: ALT: 23 IU/L (ref 0–32)

## 2016-10-08 ENCOUNTER — Other Ambulatory Visit: Payer: Self-pay | Admitting: Family Medicine

## 2016-10-08 MED ORDER — ATORVASTATIN CALCIUM 40 MG PO TABS: 40.0000 mg | ORAL_TABLET | Freq: Every day | ORAL | 6 refills | Status: DC

## 2016-10-08 NOTE — Progress Notes (Signed)
Refills sent of statin 

## 2016-10-25 DIAGNOSIS — K219 Gastro-esophageal reflux disease without esophagitis: Secondary | ICD-10-CM | POA: Diagnosis not present

## 2016-10-25 DIAGNOSIS — I10 Essential (primary) hypertension: Secondary | ICD-10-CM | POA: Diagnosis not present

## 2016-10-25 DIAGNOSIS — E782 Mixed hyperlipidemia: Secondary | ICD-10-CM | POA: Diagnosis not present

## 2016-11-08 ENCOUNTER — Other Ambulatory Visit: Payer: Self-pay | Admitting: Family Medicine

## 2016-11-08 DIAGNOSIS — E782 Mixed hyperlipidemia: Secondary | ICD-10-CM | POA: Diagnosis not present

## 2016-11-08 DIAGNOSIS — Z5181 Encounter for therapeutic drug level monitoring: Secondary | ICD-10-CM | POA: Diagnosis not present

## 2016-11-09 LAB — LIPID PANEL W/O CHOL/HDL RATIO
Cholesterol, Total: 193 mg/dL (ref 100–199)
HDL: 43 mg/dL (ref >39)
LDL Calculated: 111 mg/dL — ABNORMAL HIGH (ref 0–99)
TRIGLYCERIDES: 193 mg/dL — AB (ref 0–149)
VLDL Cholesterol Cal: 39 mg/dL (ref 5–40)

## 2016-11-09 LAB — ALT: ALT: 22 IU/L (ref 0–32)

## 2016-11-13 ENCOUNTER — Telehealth: Payer: Self-pay

## 2016-11-13 NOTE — Telephone Encounter (Signed)
Pt called asking for her lab results. Pt states she had it done at [facility] wednesday or Thursday. Looked at the chart and didn't see anything scan. I mention to pt to give it till Thursday to give it time. Pt states she didn't want to keep taking the high dosage of statin.

## 2016-11-15 ENCOUNTER — Telehealth: Payer: Self-pay

## 2016-11-15 NOTE — Telephone Encounter (Signed)
Thank you for getting the lab results Reviewed I called patient earlier today Reviewed labs She is aching in her legs Stop statin for 2 weeks completely, see if symptoms resolve Then if no sx, start back on 20 mg nightly x 2 weeks If no sx, then go back up to 40 mg nightly Monitor for sx, we'll use the lowest tolerated dose Call me and keep me posted

## 2016-11-15 NOTE — Telephone Encounter (Signed)
Spoke with the pt Monday about her lab results. Pt had them done at [facility] a d they have not came back in yet. Called [facility] and asked them to fax the results to me. [facility] representative will fax the results to me.

## 2016-11-15 NOTE — Telephone Encounter (Signed)
Dr. lada Cooper also mention me  she's experiencing leg cramps ever since Her Lipitor was increased.

## 2016-11-15 NOTE — Addendum Note (Signed)
Addended by: Jaray Boliver, Satira Anis on: 11/15/2016 07:50 PM   Modules accepted: Orders

## 2017-01-24 ENCOUNTER — Encounter: Payer: 59 | Admitting: Family Medicine

## 2017-02-12 ENCOUNTER — Ambulatory Visit (INDEPENDENT_AMBULATORY_CARE_PROVIDER_SITE_OTHER): Payer: 59 | Admitting: Family Medicine

## 2017-02-12 ENCOUNTER — Encounter: Payer: Self-pay | Admitting: Family Medicine

## 2017-02-12 VITALS — BP 118/68 | HR 85 | Temp 98.0°F | Resp 14 | Ht 63.08 in | Wt 163.6 lb

## 2017-02-12 DIAGNOSIS — Z1239 Encounter for other screening for malignant neoplasm of breast: Secondary | ICD-10-CM

## 2017-02-12 DIAGNOSIS — E782 Mixed hyperlipidemia: Secondary | ICD-10-CM

## 2017-02-12 DIAGNOSIS — Z0000 Encounter for general adult medical examination without abnormal findings: Secondary | ICD-10-CM | POA: Diagnosis not present

## 2017-02-12 DIAGNOSIS — Z1231 Encounter for screening mammogram for malignant neoplasm of breast: Secondary | ICD-10-CM | POA: Diagnosis not present

## 2017-02-12 NOTE — Assessment & Plan Note (Signed)
USPSTF grade A and B recommendations reviewed with patient; age-appropriate recommendations, preventive care, screening tests, etc discussed and encouraged; healthy living encouraged; see AVS for patient education given to patient  

## 2017-02-12 NOTE — Progress Notes (Signed)
Patient ID: Whitney Cooper, female   DOB: 1961-07-12, 56 y.o.   MRN: [id number]   Subjective:   Whitney Cooper is a 56 y.o. female here for a complete physical exam  Interim issues since last visit: nothging  USPSTF grade A and B recommendations Depression:  Depression screen [facility] 2/9 02/12/2017 07/20/2016 05/08/2016 01/20/2016 01/19/2015  Decreased Interest 0 0 0 0 0  Down, Depressed, Hopeless 0 0 0 0 0  PHQ - 2 Score 0 0 0 0 0   Hypertension: BP Readings from Last 3 Encounters:  02/12/17 118/68  10/02/16 133/69  07/20/16 122/74   Obesity: Wt Readings from Last 3 Encounters:  02/12/17 163 lb 9.6 oz (74.2 kg)  10/02/16 166 lb (75.3 kg)  07/20/16 169 lb (76.7 kg)   BMI Readings from Last 3 Encounters:  02/12/17 28.91 kg/m  10/02/16 28.49 kg/m  07/20/16 29.47 kg/m    Alcohol: no, rare Tobacco use: never HIV, hep B, hep C: declined STD testing and prevention (chl/gon/syphilis): declined Intimate partner violence: no abuse Breast cancer: last year; no lumps or bumps Cervical cancer screening: no cervix Osteoporosis: no steroids; normal DEXA 2017 Fall prevention/vitamin D: taking vit D 1000 iu daily Lipids: doubled lipid lowering med; then legs were just killing her; stopped it for a few weeks, then went back to 20 mg  Lab Results  Component Value Date   CHOL 193 11/08/2016   CHOL 199 10/05/2016   CHOL 221 (H) 07/20/2016   Lab Results  Component Value Date   HDL 43 11/08/2016   HDL 41 10/05/2016   HDL 46 (L) 07/20/2016   Lab Results  Component Value Date   LDLCALC 111 (H) 11/08/2016   LDLCALC 104 (H) 10/05/2016   LDLCALC 129 (H) 07/20/2016   Lab Results  Component Value Date   TRIG 193 (H) 11/08/2016   TRIG 271 (H) 10/05/2016   TRIG 228 (H) 07/20/2016   Lab Results  Component Value Date   CHOLHDL 4.9 (H) 10/05/2016   CHOLHDL 4.8 07/20/2016   No results found for: LDLDIRECT Glucose:  Glucose  Date Value Ref Range Status  01/19/2015 105 (H) 65 - 99  mg/dL Final  12/30/2013 101 (H) 65 - 99 mg/dL Final   Glucose, Bld  Date Value Ref Range Status  07/20/2016 103 (H) 65 - 99 mg/dL Final  01/20/2016 82 65 - 99 mg/dL Final   Colorectal cancer: 2017 Lung cancer:  Never smoker AAA: n/a Aspirin: suggested 81 mg baby coated aspirin Diet: pretty good eater; not a huge meat eater; likes peanut butter Exercise: discussed, not recently, joined gym and has lots going on at church; did more walking, walks dog 3 blocks a day Skin cancer: nothing worrisome, goes yearly to dermatologist  Past Medical History:  Diagnosis Date  . Beat, premature ventricular 06/21/2015  . Bundle branch block   . Diverticulitis of colon 07/07/2009  . Dry eyes   . Essential (primary) hypertension 06/21/2015  . Fibromyalgia   . GERD (gastroesophageal reflux disease)   . History of kidney stones   . Hyperlipidemia   . Hypertension   . IFG (impaired fasting glucose) 01/24/2015  . OAB (overactive bladder)   . Osteoarthritis   . Osteopenia determined by x-ray 02/10/2016  . Recurrent UTI    Past Surgical History:  Procedure Laterality Date  . COLONOSCOPY WITH PROPOFOL N/A 11/11/2015   Procedure: COLONOSCOPY WITH PROPOFOL;  Surgeon: Lollie Sails, MD;  Location: [facility] ENDOSCOPY;  Service: Endoscopy;  Laterality:  N/A;  . TOTAL ABDOMINAL HYSTERECTOMY  Jan 2012   Complete, due to heavy bleeding and grape-fruit size fibroid    Family History  Problem Relation Age of Onset  . Hypertension Mother   . Cancer Mother        skin  . Heart disease Father        CHF  . Hypertension Father   . Stroke Father        mini strokes  . Cancer Father        skin  . Heart attack Father   . Thyroid disease Father   . Ovarian cancer Maternal Grandmother 66  . Cancer Maternal Grandmother        ovarian/brain  . Heart attack Maternal Grandfather   . Heart attack Paternal Grandfather   . Diabetes Maternal Uncle   . Thyroid disease Sister   . Kidney disease Paternal  Grandmother   . Breast cancer Neg Hx    Social History  Substance Use Topics  . Smoking status: Never Smoker  . Smokeless tobacco: Never Used  . Alcohol use No   Review of Systems  Constitutional: Negative for unexpected weight change.  HENT: Negative for hearing loss.   Eyes: Positive for visual disturbance (has cataracts).  Respiratory: Negative for wheezing.   Cardiovascular: Negative for chest pain.  Gastrointestinal: Negative for blood in stool.  Genitourinary: Negative for hematuria.  Musculoskeletal: Positive for back pain (nothing new).  Neurological: Negative for tremors.  Hematological: Does not bruise/bleed easily.  Psychiatric/Behavioral: Negative for dysphoric mood.  had episcleritis, Dr. Joan Flores, has drops OTC  Objective:   Vitals:   02/12/17 0834  BP: 118/68  Pulse: 85  Resp: 14  Temp: 98 F (36.7 C)  TempSrc: Oral  SpO2: 97%  Weight: 163 lb 9.6 oz (74.2 kg)  Height: 5' 3.08" (1.602 m)   Body mass index is 28.91 kg/m. Wt Readings from Last 3 Encounters:  02/12/17 163 lb 9.6 oz (74.2 kg)  10/02/16 166 lb (75.3 kg)  07/20/16 169 lb (76.7 kg)   Physical Exam  Constitutional: She appears well-developed and well-nourished.  HENT:  Head: Normocephalic and atraumatic.  Right Ear: Hearing, tympanic membrane, external ear and ear canal normal.  Left Ear: Hearing, tympanic membrane, external ear and ear canal normal.  Eyes: Conjunctivae and EOM are normal. Right eye exhibits no hordeolum. Left eye exhibits no hordeolum. No scleral icterus.  Neck: Carotid bruit is not present. No thyromegaly present.  Cardiovascular: Normal rate, regular rhythm, S1 normal, S2 normal and normal heart sounds.   No extrasystoles are present.  Pulmonary/Chest: Effort normal and breath sounds normal. No respiratory distress. Right breast exhibits no inverted nipple, no mass, no nipple discharge, no skin change and no tenderness. Left breast exhibits no inverted nipple, no mass, no  nipple discharge, no skin change and no tenderness. Breasts are symmetrical.  Nipple color was a little dusky at first, then pinked up a little; no thickening, no retraction, pt thought normal for her  Abdominal: Soft. Normal appearance and bowel sounds are normal. She exhibits no distension, no abdominal bruit, no pulsatile midline mass and no mass. There is no hepatosplenomegaly. There is no tenderness. No hernia.  Musculoskeletal: Normal range of motion. She exhibits no edema.  Lymphadenopathy:       Head (right side): No submandibular adenopathy present.       Head (left side): No submandibular adenopathy present.    She has no cervical adenopathy.    She has  no axillary adenopathy.  Neurological: She is alert. She displays no tremor. No cranial nerve deficit. She exhibits normal muscle tone. Gait normal.  Reflex Scores:      Patellar reflexes are 1+ on the right side and 1+ on the left side. Skin: Skin is warm and dry. No bruising and no ecchymosis noted. No cyanosis. No pallor.  Psychiatric: Her speech is normal and behavior is normal. Thought content normal. Her mood appears not anxious. She does not exhibit a depressed mood.    Assessment/Plan:   Problem List Items Addressed This Visit      Other   Preventative health care - Primary    USPSTF grade A and B recommendations reviewed with patient; age-appropriate recommendations, preventive care, screening tests, etc discussed and encouraged; healthy living encouraged; see AVS for patient education given to patient       Relevant Orders   CBC with Differential/Platelet   Comprehensive metabolic panel   Lipid panel   TSH   Hyperlipidemia (Chronic)    On the lower dose of statin; will see if working well      Relevant Medications   atorvastatin (LIPITOR) 20 MG tablet   aspirin EC 81 MG tablet    Other Visit Diagnoses    Screening for breast cancer       Relevant Orders   MM Digital Screening       Meds ordered this  encounter  Medications  . atorvastatin (LIPITOR) 20 MG tablet    Sig: Take 20 mg by mouth daily.  Marland Kitchen aspirin EC 81 MG tablet    Sig: Take 1 tablet (81 mg total) by mouth daily.   Orders Placed This Encounter  Procedures  . MM Digital Screening    Standing Status:   Future    Standing Expiration Date:   04/15/2018    Order Specific Question:   Reason for Exam (SYMPTOM  OR DIAGNOSIS REQUIRED)    Answer:   screen for breast cancer    Order Specific Question:   Is the patient pregnant?    Answer:   No    Order Specific Question:   Preferred imaging location?    Answer:   [facility]  . CBC with Differential/Platelet  . Comprehensive metabolic panel    Order Specific Question:   Has the patient fasted?    Answer:   Yes  . Lipid panel    Order Specific Question:   Has the patient fasted?    Answer:   Yes  . TSH   Information given about shingrix by CMA; she'll consider  Follow up plan: Return in about 1 year (around 02/12/2018) for complete physical; 6 months for follow-up and fasting labs w/Dr. Sanda Klein.  An After Visit Summary was printed and given to the patient.

## 2017-02-12 NOTE — Assessment & Plan Note (Signed)
On the lower dose of statin; will see if working well

## 2017-02-12 NOTE — Patient Instructions (Addendum)
We'll get labs today If you have not heard anything from my staff in a week about any orders/referrals/studies from today, please contact us here to follow-up [phone] Let me know of any changes in your breast health Consider the shingles vaccine, Shingrix  Health Maintenance, Female Adopting a healthy lifestyle and getting preventive care can go a long way to promote health and wellness. Talk with your health care provider about what schedule of regular examinations is right for you. This is a good chance for you to check in with your provider about disease prevention and staying healthy. In between checkups, there are plenty of things you can do on your own. Experts have done a lot of research about which lifestyle changes and preventive measures are most likely to keep you healthy. Ask your health care provider for more information. Weight and diet Eat a healthy diet  Be sure to include plenty of vegetables, fruits, low-fat dairy products, and lean protein.  Do not eat a lot of foods high in solid fats, added sugars, or salt.  Get regular exercise. This is one of the most important things you can do for your health. ? Most adults should exercise for at least 150 minutes each week. The exercise should increase your heart rate and make you sweat (moderate-intensity exercise). ? Most adults should also do strengthening exercises at least twice a week. This is in addition to the moderate-intensity exercise.  Maintain a healthy weight  Body mass index (BMI) is a measurement that can be used to identify possible weight problems. It estimates body fat based on height and weight. Your health care provider can help determine your BMI and help you achieve or maintain a healthy weight.  For females 58 years of age and older: ? A BMI below 18.5 is considered underweight. ? A BMI of 18.5 to 24.9 is normal. ? A BMI of 25 to 29.9 is considered overweight. ? A BMI of 30 and above is considered  obese.  Watch levels of cholesterol and blood lipids  You should start having your blood tested for lipids and cholesterol at 58 years of age, then have this test every 5 years.  You may need to have your cholesterol levels checked more often if: ? Your lipid or cholesterol levels are high. ? You are older than 58 years of age. ? You are at high risk for heart disease.  Cancer screening Lung Cancer  Lung cancer screening is recommended for adults 51-67 years old who are at high risk for lung cancer because of a history of smoking.  A yearly low-dose CT scan of the lungs is recommended for people who: ? Currently smoke. ? Have quit within the past 15 years. ? Have at least a 30-pack-year history of smoking. A pack year is smoking an average of one pack of cigarettes a day for 1 year.  Yearly screening should continue until it has been 15 years since you quit.  Yearly screening should stop if you develop a health problem that would prevent you from having lung cancer treatment.  Breast Cancer  Practice breast self-awareness. This means understanding how your breasts normally appear and feel.  It also means doing regular breast self-exams. Let your health care provider know about any changes, no matter how small.  If you are in your 20s or 30s, you should have a clinical breast exam (CBE) by a health care provider every 1-3 years as part of a regular health exam.  If  you are 40 or older, have a CBE every year. Also consider having a breast X-ray (mammogram) every year.  If you have a family history of breast cancer, talk to your health care provider about genetic screening.  If you are at high risk for breast cancer, talk to your health care provider about having an MRI and a mammogram every year.  Breast cancer gene (BRCA) assessment is recommended for women who have family members with BRCA-related cancers. BRCA-related cancers  include: ? Breast. ? Ovarian. ? Tubal. ? Peritoneal cancers.  Results of the assessment will determine the need for genetic counseling and BRCA1 and BRCA2 testing.  Cervical Cancer Your health care provider may recommend that you be screened regularly for cancer of the pelvic organs (ovaries, uterus, and vagina). This screening involves a pelvic examination, including checking for microscopic changes to the surface of your cervix (Pap test). You may be encouraged to have this screening done every 3 years, beginning at age 34.  For women ages 5-65, health care providers may recommend pelvic exams and Pap testing every 3 years, or they may recommend the Pap and pelvic exam, combined with testing for human papilloma virus (HPV), every 5 years. Some types of HPV increase your risk of cervical cancer. Testing for HPV may also be done on women of any age with unclear Pap test results.  Other health care providers may not recommend any screening for nonpregnant women who are considered low risk for pelvic cancer and who do not have symptoms. Ask your health care provider if a screening pelvic exam is right for you.  If you have had past treatment for cervical cancer or a condition that could lead to cancer, you need Pap tests and screening for cancer for at least 20 years after your treatment. If Pap tests have been discontinued, your risk factors (such as having a new sexual partner) need to be reassessed to determine if screening should resume. Some women have medical problems that increase the chance of getting cervical cancer. In these cases, your health care provider may recommend more frequent screening and Pap tests.  Colorectal Cancer  This type of cancer can be detected and often prevented.  Routine colorectal cancer screening usually begins at 58 years of age and continues through 58 years of age.  Your health care provider may recommend screening at an earlier age if you have risk factors  for colon cancer.  Your health care provider may also recommend using home test kits to check for hidden blood in the stool.  A small camera at the end of a tube can be used to examine your colon directly (sigmoidoscopy or colonoscopy). This is done to check for the earliest forms of colorectal cancer.  Routine screening usually begins at age 70.  Direct examination of the colon should be repeated every 5-10 years through 58 years of age. However, you may need to be screened more often if early forms of precancerous polyps or small growths are found.  Skin Cancer  Check your skin from head to toe regularly.  Tell your health care provider about any new moles or changes in moles, especially if there is a change in a mole's shape or color.  Also tell your health care provider if you have a mole that is larger than the size of a pencil eraser.  Always use sunscreen. Apply sunscreen liberally and repeatedly throughout the day.  Protect yourself by wearing long sleeves, pants, a wide-brimmed hat, and sunglasses  whenever you are outside.  Heart disease, diabetes, and high blood pressure  High blood pressure causes heart disease and increases the risk of stroke. High blood pressure is more likely to develop in: ? People who have blood pressure in the high end of the normal range (130-139/85-89 mm Hg). ? People who are overweight or obese. ? People who are African American.  If you are 64-28 years of age, have your blood pressure checked every 3-5 years. If you are 58 years of age or older, have your blood pressure checked every year. You should have your blood pressure measured twice-once when you are at a hospital or clinic, and once when you are not at a hospital or clinic. Record the average of the two measurements. To check your blood pressure when you are not at a hospital or clinic, you can use: ? An automated blood pressure machine at a pharmacy. ? A home blood pressure monitor.  If  you are between 46 years and 42 years old, ask your health care provider if you should take aspirin to prevent strokes.  Have regular diabetes screenings. This involves taking a blood sample to check your fasting blood sugar level. ? If you are at a normal weight and have a low risk for diabetes, have this test once every three years after 58 years of age. ? If you are overweight and have a high risk for diabetes, consider being tested at a younger age or more often. Preventing infection Hepatitis B  If you have a higher risk for hepatitis B, you should be screened for this virus. You are considered at high risk for hepatitis B if: ? You were born in a country where hepatitis B is common. Ask your health care provider which countries are considered high risk. ? Your parents were born in a high-risk country, and you have not been immunized against hepatitis B (hepatitis B vaccine). ? You have HIV or AIDS. ? You use needles to inject street drugs. ? You live with someone who has hepatitis B. ? You have had sex with someone who has hepatitis B. ? You get hemodialysis treatment. ? You take certain medicines for conditions, including cancer, organ transplantation, and autoimmune conditions.  Hepatitis C  Blood testing is recommended for: ? Everyone born from [birth year] through 1967. ? Anyone with known risk factors for hepatitis C.  Sexually transmitted infections (STIs)  You should be screened for sexually transmitted infections (STIs) including gonorrhea and chlamydia if: ? You are sexually active and are younger than 58 years of age. ? You are older than 58 years of age and your health care provider tells you that you are at risk for this type of infection. ? Your sexual activity has changed since you were last screened and you are at an increased risk for chlamydia or gonorrhea. Ask your health care provider if you are at risk.  If you do not have HIV, but are at risk, it may be recommended  that you take a prescription medicine daily to prevent HIV infection. This is called pre-exposure prophylaxis (PrEP). You are considered at risk if: ? You are sexually active and do not regularly use condoms or know the HIV status of your partner(s). ? You take drugs by injection. ? You are sexually active with a partner who has HIV.  Talk with your health care provider about whether you are at high risk of being infected with HIV. If you choose to begin PrEP, you should  first be tested for HIV. You should then be tested every 3 months for as long as you are taking PrEP. Pregnancy  If you are premenopausal and you may become pregnant, ask your health care provider about preconception counseling.  If you may become pregnant, take 400 to 800 micrograms (mcg) of folic acid every day.  If you want to prevent pregnancy, talk to your health care provider about birth control (contraception). Osteoporosis and menopause  Osteoporosis is a disease in which the bones lose minerals and strength with aging. This can result in serious bone fractures. Your risk for osteoporosis can be identified using a bone density scan.  If you are 38 years of age or older, or if you are at risk for osteoporosis and fractures, ask your health care provider if you should be screened.  Ask your health care provider whether you should take a calcium or vitamin D supplement to lower your risk for osteoporosis.  Menopause may have certain physical symptoms and risks.  Hormone replacement therapy may reduce some of these symptoms and risks. Talk to your health care provider about whether hormone replacement therapy is right for you. Follow these instructions at home:  Schedule regular health, dental, and eye exams.  Stay current with your immunizations.  Do not use any tobacco products including cigarettes, chewing tobacco, or electronic cigarettes.  If you are pregnant, do not drink alcohol.  If you are  breastfeeding, limit how much and how often you drink alcohol.  Limit alcohol intake to no more than 1 drink per day for nonpregnant women. One drink equals 12 ounces of beer, 5 ounces of wine, or 1 ounces of hard liquor.  Do not use street drugs.  Do not share needles.  Ask your health care provider for help if you need support or information about quitting drugs.  Tell your health care provider if you often feel depressed.  Tell your health care provider if you have ever been abused or do not feel safe at home. This information is not intended to replace advice given to you by your health care provider. Make sure you discuss any questions you have with your health care provider. Document Released: 01/22/2011 Document Revised: 12/15/2015 Document Reviewed: 04/12/2015 Elsevier Interactive Patient Education  [employer].

## 2017-02-13 LAB — COMPREHENSIVE METABOLIC PANEL
ALT: 20 IU/L (ref 0–32)
AST: 22 IU/L (ref 0–40)
Albumin/Globulin Ratio: 1.8 (ref 1.2–2.2)
Albumin: 4.7 g/dL (ref 3.5–5.5)
Alkaline Phosphatase: 67 IU/L (ref 39–117)
BILIRUBIN TOTAL: 0.6 mg/dL (ref 0.0–1.2)
BUN/Creatinine Ratio: 13 (ref 9–23)
BUN: 10 mg/dL (ref 6–24)
CHLORIDE: 106 mmol/L (ref 96–106)
CO2: 24 mmol/L (ref 20–29)
CREATININE: 0.79 mg/dL (ref 0.57–1.00)
Calcium: 9.9 mg/dL (ref 8.7–10.2)
GFR calc Af Amer: 95 mL/min/{1.73_m2} (ref >59)
GFR calc non Af Amer: 83 mL/min/{1.73_m2} (ref >59)
GLUCOSE: 106 mg/dL — AB (ref 65–99)
Globulin, Total: 2.6 g/dL (ref 1.5–4.5)
Potassium: 4.7 mmol/L (ref 3.5–5.2)
Sodium: 144 mmol/L (ref 134–144)
Total Protein: 7.3 g/dL (ref 6.0–8.5)

## 2017-02-13 LAB — CBC WITH DIFFERENTIAL/PLATELET
BASOS ABS: 0 10*3/uL (ref 0.0–0.2)
Basos: 0 %
EOS (ABSOLUTE): 0.2 10*3/uL (ref 0.0–0.4)
Eos: 2 %
HEMOGLOBIN: 14.6 g/dL (ref 11.1–15.9)
Hematocrit: 41.9 % (ref 34.0–46.6)
Immature Grans (Abs): 0 10*3/uL (ref 0.0–0.1)
Immature Granulocytes: 0 %
LYMPHS ABS: 1.8 10*3/uL (ref 0.7–3.1)
Lymphs: 22 %
MCH: 28.9 pg (ref 26.6–33.0)
MCHC: 34.8 g/dL (ref 31.5–35.7)
MCV: 83 fL (ref 79–97)
MONOCYTES: 7 %
MONOS ABS: 0.5 10*3/uL (ref 0.1–0.9)
NEUTROS ABS: 5.4 10*3/uL (ref 1.4–7.0)
Neutrophils: 69 %
Platelets: 267 10*3/uL (ref 150–379)
RBC: 5.06 x10E6/uL (ref 3.77–5.28)
RDW: 13.8 % (ref 12.3–15.4)
WBC: 7.9 10*3/uL (ref 3.4–10.8)

## 2017-02-13 LAB — LIPID PANEL
CHOLESTEROL TOTAL: 179 mg/dL (ref 100–199)
Chol/HDL Ratio: 4.1 ratio (ref 0.0–4.4)
HDL: 44 mg/dL (ref >39)
LDL CALC: 98 mg/dL (ref 0–99)
TRIGLYCERIDES: 184 mg/dL — AB (ref 0–149)
VLDL Cholesterol Cal: 37 mg/dL (ref 5–40)

## 2017-02-13 LAB — TSH: TSH: 5.18 u[IU]/mL — ABNORMAL HIGH (ref 0.450–4.500)

## 2017-03-05 ENCOUNTER — Ambulatory Visit
Admission: RE | Admit: 2017-03-05 | Discharge: 2017-03-05 | Disposition: A | Discharge status: Home / Self Care | Payer: 59 | Source: Ambulatory Visit | Attending: Family Medicine | Admitting: Family Medicine

## 2017-03-05 DIAGNOSIS — Z1231 Encounter for screening mammogram for malignant neoplasm of breast: Secondary | ICD-10-CM | POA: Insufficient documentation

## 2017-03-05 DIAGNOSIS — Z1239 Encounter for other screening for malignant neoplasm of breast: Secondary | ICD-10-CM

## 2017-03-05 LAB — HM MAMMOGRAPHY

## 2017-03-21 ENCOUNTER — Ambulatory Visit
Admission: EM | Admit: 2017-03-21 | Discharge: 2017-03-21 | Disposition: A | Discharge status: Home / Self Care | Payer: 59 | Attending: Family Medicine | Admitting: Family Medicine

## 2017-03-21 DIAGNOSIS — M5030 Other cervical disc degeneration, unspecified cervical region: Secondary | ICD-10-CM | POA: Diagnosis not present

## 2017-03-21 DIAGNOSIS — H81399 Other peripheral vertigo, unspecified ear: Secondary | ICD-10-CM | POA: Diagnosis not present

## 2017-03-21 DIAGNOSIS — M62838 Other muscle spasm: Secondary | ICD-10-CM | POA: Diagnosis not present

## 2017-03-21 MED ORDER — NAPROXEN 500 MG PO TABS: 500.0000 mg | ORAL_TABLET | Freq: Two times a day (BID) | ORAL | 0 refills | Status: DC

## 2017-03-21 MED ORDER — METAXALONE 800 MG PO TABS: 800.0000 mg | ORAL_TABLET | Freq: Three times a day (TID) | ORAL | 0 refills | Status: DC

## 2017-03-21 MED ORDER — ONDANSETRON 8 MG PO TBDP
8.0000 mg | ORAL_TABLET | Freq: Two times a day (BID) | ORAL | 0 refills | Status: DC
Start: 2017-03-21 — End: 2017-05-07

## 2017-03-21 NOTE — ED Provider Notes (Signed)
[facility]    CSN: [id number] Arrival date & time: 03/21/17  1149     History   Chief Complaint Chief Complaint  Patient presents with  . Dizziness    HPI KADESHIA KASPARIAN is a 56 y.o. female.   HPI  This a 56 year old female who presents with dizziness when she moves her head. She describes it as the room spinning and having nausea. She's had no vomiting. She denies any chest pain. She's had no hearing loss no tinnitus. No Ear drainage. She does have a history of degenerative disc disease of her cervical spine has constant pain and muscle spasm in her trapezii and in her paraspinous muscles. 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 The end of the day she is very tight and tender in her muscles. She had one other episode of vertigo that was listed in May 2017. She was given meclizine as well as diclofenac and Skelaxin. That seemed to help. She has taken meclizine this morning but has not noticed any improvement. Denies any other neurological symptoms has had no difficulty with walking. Denies any visual disturbances denies any loss of muscle strength any numbness or tingling.         Past Medical History:  Diagnosis Date  . Beat, premature ventricular 06/21/2015  . Bundle branch block   . Diverticulitis of colon 07/07/2009  . Dry eyes   . Essential (primary) hypertension 06/21/2015  . Fibromyalgia   . GERD (gastroesophageal reflux disease)   . History of kidney stones   . Hyperlipidemia   . Hypertension   . IFG (impaired fasting glucose) 01/24/2015  . OAB (overactive bladder)   . Osteoarthritis   . Osteopenia determined by x-ray 02/10/2016  . Recurrent UTI     Patient Active Problem List   Diagnosis Date Noted  . Insomnia 04/10/2016  . Upper back pain, chronic 02/10/2016  . Neck pain, chronic 02/10/2016  . Preventative health care 01/20/2016  . Medication monitoring encounter 11/15/2015  . Essential  (primary) hypertension 06/21/2015  . Combined fat and carbohydrate induced hyperlipemia 06/21/2015  . Beat, premature ventricular 06/21/2015  . Urinary frequency 03/15/2015  . Strangury 03/15/2015  . Incontinence 03/15/2015  . Right flank pain 03/15/2015  . Atrophic vaginitis 03/15/2015  . Abnormal thyroid function test 01/24/2015  . IFG (impaired fasting glucose) 01/24/2015  . Overweight (BMI 25.0-29.9) 01/19/2015  . Urinary retention with incomplete bladder emptying 01/19/2015  . Hyperlipidemia   . Osteoarthritis   . GERD (gastroesophageal reflux disease)   . Fibromyalgia   . Diverticulitis of colon 07/07/2009    Past Surgical History:  Procedure Laterality Date  . COLONOSCOPY WITH PROPOFOL N/A 11/11/2015   Procedure: COLONOSCOPY WITH PROPOFOL;  Surgeon: Lollie Sails, MD;  Location: [facility] ENDOSCOPY;  Service: Endoscopy;  Laterality: N/A;  . TOTAL ABDOMINAL HYSTERECTOMY  Jan 2012   Complete, due to heavy bleeding and grape-fruit size fibroid     OB History    No data available       Home Medications    Prior to Admission medications   Medication Sig Start Date End Date Taking? Authorizing Provider  aspirin EC 81 MG tablet Take 1 tablet (81 mg total) by mouth daily. 02/12/17   Arnetha Courser, MD  atorvastatin (LIPITOR) 20 MG tablet Take 20 mg by mouth daily. 11/26/16   [provider]  Cholecalciferol (VITAMIN D3) 1000 units CAPS Take 1 capsule (1,000 Units  total) by mouth daily. 01/20/16   Arnetha Courser, MD  Coenzyme Q10 (CO Q-10) 100 MG CAPS Take by mouth daily.     [provider]  metaxalone (SKELAXIN) 800 MG tablet Take 1 tablet (800 mg total) by mouth 3 (three) times daily. 03/21/17   Lorin Picket, PA-C  metoprolol succinate (TOPROL-XL) 50 MG 24 hr tablet Take 1 tablet (50 mg total) by mouth daily. 04/28/16   Arnetha Courser, MD  mirtazapine (REMERON) 15 MG tablet Take 1 tablet (15 mg total) by mouth at bedtime. Patient taking differently: Take  7.5 mg by mouth at bedtime.  04/24/16   Wilhelmina Mcardle, MD  Multiple Vitamin (MULTI-VITAMINS) TABS Take by mouth daily.    [provider]  naproxen (NAPROSYN) 500 MG tablet Take 1 tablet (500 mg total) by mouth 2 (two) times daily. 03/21/17   Lorin Picket, PA-C  ondansetron (ZOFRAN ODT) 8 MG disintegrating tablet Take 1 tablet (8 mg total) by mouth 2 (two) times daily. Take for nausea 03/21/17   Crecencio Mc P, PA-C  ranitidine (ZANTAC) 300 MG tablet Take 1 tablet (300 mg total) by mouth at bedtime. For heartburn 07/20/16   Arnetha Courser, MD    Family History Family History  Problem Relation Age of Onset  . Hypertension Mother   . Cancer Mother        skin  . Heart disease Father        CHF  . Hypertension Father   . Stroke Father        mini strokes  . Cancer Father        skin  . Heart attack Father   . Thyroid disease Father   . Ovarian cancer Maternal Grandmother 66  . Cancer Maternal Grandmother        ovarian/brain  . Heart attack Maternal Grandfather   . Heart attack Paternal Grandfather   . Diabetes Maternal Uncle   . Thyroid disease Sister   . Kidney disease Paternal Grandmother   . Breast cancer Neg Hx     Social History Social History  Substance Use Topics  . Smoking status: Never Smoker  . Smokeless tobacco: Never Used  . Alcohol use No     Allergies   Codeine and Sulfa antibiotics   Review of Systems Review of Systems  Constitutional: Positive for activity change and appetite change. Negative for chills, fatigue and fever.  Neurological: Positive for dizziness. Negative for tremors, seizures, syncope, facial asymmetry, speech difficulty, weakness, numbness and headaches.  Psychiatric/Behavioral: Negative.   All other systems reviewed and are negative.    Physical Exam Triage Vital Signs ED Triage Vitals  Enc Vitals Group     BP 03/21/17 1202 (!) 145/67     Pulse Rate 03/21/17 1202 67     Resp 03/21/17 1202 16     Temp  03/21/17 1202 97.7 F (36.5 C)     Temp Source 03/21/17 1202 Oral     SpO2 03/21/17 1202 99 %     Weight 03/21/17 1203 158 lb (71.7 kg)     Height 03/21/17 1203 5' 3.5" (1.613 m)     Head Circumference --      Peak Flow --      Pain Score --      Pain Loc --      Pain Edu? --      Excl. in [place]? --    No data found.   Updated Vital Signs BP  137/77 (BP Location: Right Arm)   Pulse 78   Temp 97.7 F (36.5 C) (Oral)   Resp 16   Ht 5' 3.5" (1.613 m)   Wt 158 lb (71.7 kg)   SpO2 99%   BMI 27.55 kg/m   Visual Acuity Right Eye Distance:   Left Eye Distance:   Bilateral Distance:    Right Eye Near:   Left Eye Near:    Bilateral Near:     Physical Exam  Constitutional: She is oriented to person, place, and time. She appears well-developed and well-nourished. No distress.  HENT:  Head: Normocephalic.  Right Ear: External ear normal.  Left Ear: External ear normal.  Nose: Nose normal.  Mouth/Throat: Oropharynx is clear and moist. No oropharyngeal exudate.  Eyes: Pupils are equal, round, and reactive to light. EOM are normal. Right eye exhibits no discharge. Left eye exhibits no discharge.  Neck:  Examination of the cervical spine shows a decreased range of motion. She has discomfort at the extremes. Trapezii are very tight and tender bilaterally. His extends up into the paraspinous cervical muscles. Upper extremity strength and sensation are intact. DTRs are 2+ over 4 in the upper extremities. Lower sternal he shows a 1+ over 4 of the right knee reflex and a 2+ over 4 on the left.  Pulmonary/Chest: Effort normal and breath sounds normal.  Musculoskeletal: She exhibits tenderness.  Neurological: She is alert and oriented to person, place, and time. She displays normal reflexes. No cranial nerve deficit or sensory deficit. She exhibits normal muscle tone. Coordination normal.  Patient exhibits a negative Romberg. He has a normal gait. Balance is normal.  Skin: She is not  diaphoretic.  Nursing note and vitals reviewed.    UC Treatments / Results  Labs (all labs ordered are listed, but only abnormal results are displayed) Labs Reviewed - No data to display  EKG  EKG Interpretation None       Radiology No results found.  Procedures Procedures (including critical care time)  Medications Ordered in UC Medications - No data to display   Initial Impression / Assessment and Plan / UC Course  I have reviewed the triage vital signs and the nursing notes.  Pertinent labs & imaging results that were available during my care of the patient were reviewed by me and considered in my medical decision making (see chart for details).     Plan: 1. Test/x-ray results and diagnosis reviewed with patient 2. rx as per orders; risks, benefits, potential side effects reviewed with patient 3. Recommend supportive treatment with rest and hydration. Apply ice to your neck and trapezial muscles 20 minutes out of every 2 hours 4- 5 times daily. She is cautioned regarding use of Skelaxin with activities requiring concentration and judgment. She should not drive while taking the Skelaxin. I have provided her with Zofran as necessary for nausea. I've encouraged her to follow-up with her primary care physician in a couple of days if she is not improving or go to the emergency room if she worsens. She may need to explore different ergonomics for her workstation. This may be the cause of her continuing cervical spasm. I've also encouraged her to use Prilosec Prevacid or Nexium or taking the Naprosyn because of her severe GERD. 4. F/u prn if symptoms worsen or don't improve   Final Clinical Impressions(s) / UC Diagnoses   Final diagnoses:  Cervical paraspinal muscle spasm  DDD (degenerative disc disease), cervical  Peripheral vertigo, unspecified laterality  New Prescriptions Discharge Medication List as of 03/21/2017  1:11 PM    START taking these medications    Details  metaxalone (SKELAXIN) 800 MG tablet Take 1 tablet (800 mg total) by mouth 3 (three) times daily., Starting Thu 03/21/2017, Normal    naproxen (NAPROSYN) 500 MG tablet Take 1 tablet (500 mg total) by mouth 2 (two) times daily., Starting Thu 03/21/2017, Normal    ondansetron (ZOFRAN ODT) 8 MG disintegrating tablet Take 1 tablet (8 mg total) by mouth 2 (two) times daily. Take for nausea, Starting Thu 03/21/2017, Print         Controlled Substance Prescriptions [place] Controlled Substance Registry consulted? Not Applicable   Lorin Picket, PA-C 03/21/17 1343

## 2017-03-21 NOTE — ED Triage Notes (Signed)
Pt reports "I think I have vertigo." Dizzy when moving head "room spinning" and nausea.

## 2017-03-21 NOTE — Discharge Instructions (Signed)
Use ice 20 minutes out of every 2 hours 4-5 times daily. Follow-up with your primary care physician if not improving in several days. Go the emergency room if your symptoms worsen.

## 2017-04-25 ENCOUNTER — Other Ambulatory Visit: Payer: Self-pay | Admitting: Family Medicine

## 2017-05-07 ENCOUNTER — Ambulatory Visit (INDEPENDENT_AMBULATORY_CARE_PROVIDER_SITE_OTHER): Payer: 59 | Admitting: Family Medicine

## 2017-05-07 ENCOUNTER — Encounter: Payer: Self-pay | Admitting: Family Medicine

## 2017-05-07 VITALS — BP 128/72 | HR 100 | Temp 98.1°F | Resp 16 | Ht 64.0 in | Wt 164.2 lb

## 2017-05-07 DIAGNOSIS — I10 Essential (primary) hypertension: Secondary | ICD-10-CM | POA: Diagnosis not present

## 2017-05-07 DIAGNOSIS — G8929 Other chronic pain: Secondary | ICD-10-CM

## 2017-05-07 DIAGNOSIS — E782 Mixed hyperlipidemia: Secondary | ICD-10-CM | POA: Diagnosis not present

## 2017-05-07 DIAGNOSIS — R42 Dizziness and giddiness: Secondary | ICD-10-CM

## 2017-05-07 DIAGNOSIS — M542 Cervicalgia: Secondary | ICD-10-CM | POA: Diagnosis not present

## 2017-05-07 MED ORDER — METAXALONE 800 MG PO TABS: 800.0000 mg | ORAL_TABLET | Freq: Every evening | ORAL | 0 refills | Status: DC | PRN

## 2017-05-07 NOTE — Patient Instructions (Addendum)

## 2017-05-07 NOTE — Addendum Note (Signed)
Addended by: Hubbard Hartshorn on: 05/07/2017 09:20 AM   Modules accepted: Orders

## 2017-05-07 NOTE — Progress Notes (Addendum)
Name: Whitney Cooper   MRN: [id number]    DOB: 09-03-1960   Date:05/07/2017       Progress Note  Subjective  Chief Complaint  Chief Complaint  Patient presents with  . Dizziness    seen at [facility] , would like referral to ENT  . Follow-up    neck and back pain    HPI  Pt presents with complaint of vertigo for 3-4 days with neck spasms.  She went to physical therapy last year for her neck and this helped some, but she is having continued intermittent symptoms. 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  She also notes several other episodes of vertigo over the last several months - one of which she went to the [facility] for and received muscle relaxer, Zofran, and Naproxen which seemed to help some.  Endorses nausea and feeling unsteady with episodes; usually last 1-2 days.  No vision changes, weakness, confusion, facial droop, or speech difficulties.  Pt does have C-spine degenerative changes  Per Xray 02/09/2017.  Uses ice when her neck is hurting.  Does home exercises daily.  Takes Naproxen PRN, Skelaxin PRN - has about 3 left.  Sees Dr. Nehemiah Massed for palpitations and BBB, she does have hyperlipidemia and HTN - she does get a headache occasionally with these episodes.  BP is at goal today, lipids 02/12/2017 were at goal except for moderately elevated TGD's.  Patient Active Problem List   Diagnosis Date Noted  . Insomnia 04/10/2016  . Upper back pain, chronic 02/10/2016  . Neck pain, chronic 02/10/2016  . Preventative health care 01/20/2016  . Medication monitoring encounter 11/15/2015  . Essential (primary) hypertension 06/21/2015  . Combined fat and carbohydrate induced hyperlipemia 06/21/2015  . Beat, premature ventricular 06/21/2015  . Urinary frequency 03/15/2015  . Strangury 03/15/2015  . Incontinence 03/15/2015  . Right flank pain 03/15/2015  . Atrophic vaginitis 03/15/2015  . Abnormal thyroid function test 01/24/2015   . IFG (impaired fasting glucose) 01/24/2015  . Overweight (BMI 25.0-29.9) 01/19/2015  . Urinary retention with incomplete bladder emptying 01/19/2015  . Hyperlipidemia   . Osteoarthritis   . GERD (gastroesophageal reflux disease)   . Fibromyalgia   . Diverticulitis of colon 07/07/2009    Social History  Substance Use Topics  . Smoking status: Never Smoker  . Smokeless tobacco: Never Used  . Alcohol use No     Current Outpatient Prescriptions:  .  aspirin EC 81 MG tablet, Take 1 tablet (81 mg total) by mouth daily., Disp: , Rfl:  .  atorvastatin (LIPITOR) 20 MG tablet, Take 20 mg by mouth daily., Disp: , Rfl:  .  Cholecalciferol (VITAMIN D3) 1000 units CAPS, Take 1 capsule (1,000 Units total) by mouth daily., Disp: , Rfl:  .  Coenzyme Q10 (CO Q-10) 100 MG CAPS, Take by mouth daily. , Disp: , Rfl:  .  metaxalone (SKELAXIN) 800 MG tablet, Take 1 tablet (800 mg total) by mouth 3 (three) times daily., Disp: 21 tablet, Rfl: 0 .  metoprolol succinate (TOPROL-XL) 50 MG 24 hr tablet, Take 1 tablet (50 mg total) by mouth daily., Disp: 30 tablet, Rfl: 11 .  mirtazapine (REMERON) 15 MG tablet, Take 1 tablet (15 mg total) by mouth at bedtime. (Patient taking differently: Take 7.5 mg by mouth at bedtime. ), Disp: 30 tablet, Rfl: 10 .  Multiple Vitamin (MULTI-VITAMINS) TABS, Take by mouth daily., Disp: , Rfl:  .  naproxen (NAPROSYN) 500 MG tablet, Take 1 tablet (500 mg total) by mouth 2 (two) times daily., Disp: 30 tablet, Rfl: 0 .  ranitidine (ZANTAC) 300 MG tablet, Take 1 tablet (300 mg total) by mouth at bedtime. For heartburn, Disp: 30 tablet, Rfl: 5 .  ondansetron (ZOFRAN ODT) 8 MG disintegrating tablet, Take 1 tablet (8 mg total) by mouth 2 (two) times daily. Take for nausea (Patient not taking: Reported on 05/07/2017), Disp: 6 tablet, Rfl: 0  Allergies  Allergen Reactions  . Codeine Other (See Comments)  . Sulfa Antibiotics Rash    ROS  Constitutional: Negative for fever or weight  change.  Respiratory: Negative for cough and shortness of breath.   Cardiovascular: Negative for chest pain or palpitations.  Gastrointestinal: Negative for abdominal pain, no bowel changes.  Musculoskeletal: Negative for gait problem or joint swelling.  Positive for neck pain. Skin: Negative for rash.  Neurological: Positive for dizziness; negative for headache.  No other specific complaints in a complete review of systems (except as listed in HPI above).  Objective  Vitals:   05/07/17 0835  BP: 128/72  Pulse: 100  Resp: 16  Temp: 98.1 F (36.7 C)  TempSrc: Oral  SpO2: 95%  Weight: 164 lb 3.2 oz (74.5 kg)  Height: 5\' 4"  (1.626 m)   Body mass index is 28.18 kg/m.  Nursing Note and Vital Signs reviewed.  Physical Exam Constitutional: Patient appears well-developed and well-nourished. No distress.  HEENT: head atraumatic, normocephalic, pupils equal and reactive to light, EOM's intact, TM's without erythema or bulging, no maxillary or frontal sinus pain on palpation, neck supple without lymphadenopathy, oropharynx pink and moist without exudate Cardiovascular: Normal rate, regular rhythm, S1/S2 present.  No murmur or rub heard. No BLE edema.  No carotid bruit Pulmonary/Chest: Effort normal and breath sounds clear. No respiratory distress or retractions. Psychiatric: Patient has a normal mood and affect. behavior is normal. Judgment and thought content normal. MSK: Slightly limited AROM of the neck; LEFT trapezius is tight but no palpable spasm on examination.  No joint effusion or bony tenderness. Neurological: he is alert and oriented to person, place, and time. No cranial nerve deficit. Coordination, balance, strength, speech and gait are normal. No nystagmus, rapid alternating movement is WNL. Skin: Skin is warm and dry. No rash noted. No erythema.    Recent Results (from the past 2160 hour(s))  CBC with Differential/Platelet     Status: None   Collection Time: 02/12/17  12:00 AM  Result Value Ref Range   WBC 7.9 3.4 - 10.8 x10E3/uL   RBC 5.06 3.77 - 5.28 x10E6/uL   Hemoglobin 14.6 11.1 - 15.9 g/dL   Hematocrit 41.9 34.0 - 46.6 %   MCV 83 79 - 97 fL   MCH 28.9 26.6 - 33.0 pg   MCHC 34.8 31.5 - 35.7 g/dL   RDW 13.8 12.3 - 15.4 %   Platelets 267 150 - 379 x10E3/uL   Neutrophils 69 Not Estab. %   Lymphs 22 Not Estab. %   Monocytes 7 Not Estab. %   Eos 2 Not Estab. %   Basos 0 Not Estab. %   Neutrophils Absolute 5.4 1.4 - 7.0 x10E3/uL   Lymphocytes Absolute 1.8 0.7 - 3.1 x10E3/uL   Monocytes Absolute 0.5 0.1 - 0.9 x10E3/uL   EOS (ABSOLUTE) 0.2 0.0 - 0.4 x10E3/uL   Basophils Absolute 0.0 0.0 - 0.2 x10E3/uL   Immature Granulocytes 0 Not Estab. %   Immature Grans (Abs) 0.0 0.0 - 0.1  x10E3/uL  Comprehensive metabolic panel     Status: Abnormal   Collection Time: 02/12/17 12:00 AM  Result Value Ref Range   Glucose 106 (H) 65 - 99 mg/dL   BUN 10 6 - 24 mg/dL   Creatinine, Ser 0.79 0.57 - 1.00 mg/dL   GFR calc non Af Amer 83 >59 mL/min/1.73   GFR calc Af Amer 95 >59 mL/min/1.73   BUN/Creatinine Ratio 13 9 - 23   Sodium 144 134 - 144 mmol/L   Potassium 4.7 3.5 - 5.2 mmol/L   Chloride 106 96 - 106 mmol/L   CO2 24 20 - 29 mmol/L   Calcium 9.9 8.7 - 10.2 mg/dL   Total Protein 7.3 6.0 - 8.5 g/dL   Albumin 4.7 3.5 - 5.5 g/dL   Globulin, Total 2.6 1.5 - 4.5 g/dL   Albumin/Globulin Ratio 1.8 1.2 - 2.2   Bilirubin Total 0.6 0.0 - 1.2 mg/dL   Alkaline Phosphatase 67 39 - 117 IU/L   AST 22 0 - 40 IU/L   ALT 20 0 - 32 IU/L  Lipid panel     Status: Abnormal   Collection Time: 02/12/17 12:00 AM  Result Value Ref Range   Cholesterol, Total 179 100 - 199 mg/dL   Triglycerides 184 (H) 0 - 149 mg/dL   HDL 44 >39 mg/dL   VLDL Cholesterol Cal 37 5 - 40 mg/dL   LDL Calculated 98 0 - 99 mg/dL   Chol/HDL Ratio 4.1 0.0 - 4.4 ratio    Comment:                                   T. Chol/HDL Ratio                                             Men  Women                                1/2 Avg.Risk  3.4    3.3                                   Avg.Risk  5.0    4.4                                2X Avg.Risk  9.6    7.1                                3X Avg.Risk 23.4   11.0   TSH     Status: Abnormal   Collection Time: 02/12/17 12:00 AM  Result Value Ref Range   TSH 5.180 (H) 0.450 - 4.500 uIU/mL  HM MAMMOGRAPHY     Status: None   Collection Time: 03/05/17 12:00 AM  Result Value Ref Range   HM Mammogram 0-4 Bi-Rad 0-4 Bi-Rad, Self Reported Normal     Assessment & Plan  1. Vertigo - Ambulatory referral to Neurology  2. Neck pain, chronic - Ambulatory referral to Neurology - metaxalone (SKELAXIN) 800 MG tablet; Take 1 tablet (800 mg total)  by mouth at bedtime as needed for muscle spasms.  Dispense: 14 tablet; Refill: 0  3. Essential (primary) hypertension Stable - at goal today  4. Mixed hyperlipidemia - Taking lipitor; most recent lipids were at goal  - Only takes muscle relaxer at home when her neck is very tight - she declines meclizine   -Red flags and when to present for emergency care or RTC including fever >101.71F, chest pain, shortness of breath, new/worsening/un-resolving symptoms, facial droop, confusion, weakness, speech difficulties, vision change, worst headache of your life reviewed with patient at time of visit. Follow up and care instructions discussed and provided in AVS.

## 2017-05-21 ENCOUNTER — Other Ambulatory Visit: Payer: Self-pay | Admitting: Family Medicine

## 2017-05-21 ENCOUNTER — Other Ambulatory Visit: Payer: Self-pay | Admitting: Pulmonary Disease

## 2017-05-21 NOTE — Telephone Encounter (Signed)
Spoke with pt and informed her that we would fill Remeron for 1 month but she will have to get PCP to fill after this. Pt verbalized understanding and states she is only taking a half tablet nightly and is trying to come off of it. Nothing further needed.

## 2017-05-21 NOTE — Telephone Encounter (Signed)
Per DS, we can refill 1 time but pt will need to get PCP to refill after this one.

## 2017-05-21 NOTE — Telephone Encounter (Signed)
please advise if we can refill.

## 2017-05-31 DIAGNOSIS — H8111 Benign paroxysmal vertigo, right ear: Secondary | ICD-10-CM | POA: Diagnosis not present

## 2017-05-31 DIAGNOSIS — G43109 Migraine with aura, not intractable, without status migrainosus: Secondary | ICD-10-CM | POA: Diagnosis not present

## 2017-05-31 DIAGNOSIS — E538 Deficiency of other specified B group vitamins: Secondary | ICD-10-CM | POA: Diagnosis not present

## 2017-05-31 DIAGNOSIS — E559 Vitamin D deficiency, unspecified: Secondary | ICD-10-CM | POA: Diagnosis not present

## 2017-05-31 DIAGNOSIS — R42 Dizziness and giddiness: Secondary | ICD-10-CM | POA: Diagnosis not present

## 2017-06-05 ENCOUNTER — Ambulatory Visit: Payer: 59 | Admitting: Family Medicine

## 2017-06-05 ENCOUNTER — Other Ambulatory Visit: Payer: Self-pay | Admitting: Neurology

## 2017-06-05 DIAGNOSIS — R42 Dizziness and giddiness: Secondary | ICD-10-CM

## 2017-06-11 DIAGNOSIS — L570 Actinic keratosis: Secondary | ICD-10-CM | POA: Diagnosis not present

## 2017-06-11 DIAGNOSIS — D225 Melanocytic nevi of trunk: Secondary | ICD-10-CM | POA: Diagnosis not present

## 2017-06-19 ENCOUNTER — Ambulatory Visit
Admission: RE | Admit: 2017-06-19 | Discharge: 2017-06-19 | Disposition: A | Discharge status: Home / Self Care | Payer: 59 | Source: Ambulatory Visit | Attending: Neurology | Admitting: Neurology

## 2017-06-19 ENCOUNTER — Other Ambulatory Visit
Admission: RE | Admit: 2017-06-19 | Discharge: 2017-06-19 | Disposition: A | Discharge status: Home / Self Care | Payer: 59 | Source: Ambulatory Visit | Attending: Neurology | Admitting: Neurology

## 2017-06-19 DIAGNOSIS — G43109 Migraine with aura, not intractable, without status migrainosus: Secondary | ICD-10-CM | POA: Diagnosis not present

## 2017-06-19 DIAGNOSIS — R42 Dizziness and giddiness: Secondary | ICD-10-CM | POA: Insufficient documentation

## 2017-06-19 LAB — COMPREHENSIVE METABOLIC PANEL
ALT: 30 U/L (ref 14–54)
AST: 24 U/L (ref 15–41)
Albumin: 4.7 g/dL (ref 3.5–5.0)
Alkaline Phosphatase: 67 U/L (ref 38–126)
Anion gap: 6 (ref 5–15)
BUN: 15 mg/dL (ref 6–20)
CALCIUM: 9.3 mg/dL (ref 8.9–10.3)
CHLORIDE: 104 mmol/L (ref 101–111)
CO2: 28 mmol/L (ref 22–32)
CREATININE: 0.66 mg/dL (ref 0.44–1.00)
Glucose, Bld: 104 mg/dL — ABNORMAL HIGH (ref 65–99)
Potassium: 4.4 mmol/L (ref 3.5–5.1)
Sodium: 138 mmol/L (ref 135–145)
Total Bilirubin: 0.6 mg/dL (ref 0.3–1.2)
Total Protein: 8 g/dL (ref 6.5–8.1)

## 2017-06-19 MED ORDER — GADOBENATE DIMEGLUMINE 529 MG/ML IV SOLN
15.0000 mL | Freq: Once | INTRAVENOUS | Status: AC | PRN
  Administered 2017-06-19: 15 mL via INTRAVENOUS

## 2017-06-20 ENCOUNTER — Ambulatory Visit: Payer: 59 | Admitting: Family Medicine

## 2017-06-20 ENCOUNTER — Encounter: Payer: Self-pay | Admitting: Family Medicine

## 2017-06-20 DIAGNOSIS — G4700 Insomnia, unspecified: Secondary | ICD-10-CM

## 2017-06-20 DIAGNOSIS — M542 Cervicalgia: Secondary | ICD-10-CM

## 2017-06-20 DIAGNOSIS — I10 Essential (primary) hypertension: Secondary | ICD-10-CM | POA: Diagnosis not present

## 2017-06-20 DIAGNOSIS — G8929 Other chronic pain: Secondary | ICD-10-CM

## 2017-06-20 DIAGNOSIS — R946 Abnormal results of thyroid function studies: Secondary | ICD-10-CM | POA: Diagnosis not present

## 2017-06-20 DIAGNOSIS — K219 Gastro-esophageal reflux disease without esophagitis: Secondary | ICD-10-CM

## 2017-06-20 DIAGNOSIS — E663 Overweight: Secondary | ICD-10-CM | POA: Diagnosis not present

## 2017-06-20 MED ORDER — ESOMEPRAZOLE MAGNESIUM 20 MG PO CPDR: 20.0000 mg | DELAYED_RELEASE_CAPSULE | ORAL | 5 refills | Status: DC | PRN

## 2017-06-20 NOTE — Assessment & Plan Note (Signed)
She has seen ortho and neuro for this

## 2017-06-20 NOTE — Assessment & Plan Note (Signed)
Fair control today 

## 2017-06-20 NOTE — Assessment & Plan Note (Signed)
With modest weight gain; offered to check today; patient is open to checking next visit

## 2017-06-20 NOTE — Assessment & Plan Note (Signed)
Discussed at length; suggested melatonin at the exact same time of night; process work concerns a few hours before bed, free of distractions; taper off of remeron
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
## 2017-06-20 NOTE — Assessment & Plan Note (Signed)
Weight gain may be decreased activity, but may be related to abnormal thyroid labs; offered to test today; she declined and said we'll check next visit

## 2017-06-20 NOTE — Assessment & Plan Note (Signed)
Already had EGD with Dr. Gustavo Lah; no red flags; she is aware of risks of long term PPI use; will stop the H2 blocker since ineffective and go back on the PPI; refills sent; see AVS for other tips, foods to avoid, etc.

## 2017-06-20 NOTE — Patient Instructions (Addendum)
Try the remeron every other night for the next 2-3 weeks, then stop completely Take the melatonin at the exact same time of night for best eeffectiveness Let your brain "process" your work concerns a few hours before bed; don't let the bedroom be a place to worry Caution: prolonged use of proton pump inhibitors like omeprazole (Prilosec), pantoprazole (Protonix), esomeprazole (Nexium), and others like Dexilant and Aciphex may increase your risk of pneumonia, Clostridium difficile colitis, osteoporosis, anemia and other health complications Try to limit or avoid triggers like coffee, caffeinated beverages, onions, chocolate, spicy foods, peppermint, acidic foods like pizza, spaghetti sauce, and orange juice Lose weight if you are overweight or obese Try elevating the head of your bed by placing a small wedge between your mattress and box springs to keep acid in the stomach at night instead of coming up into your esophagus Keep your next appointment

## 2017-06-28 DIAGNOSIS — R42 Dizziness and giddiness: Secondary | ICD-10-CM | POA: Diagnosis not present

## 2017-08-08 DIAGNOSIS — K5792 Diverticulitis of intestine, part unspecified, without perforation or abscess without bleeding: Secondary | ICD-10-CM | POA: Diagnosis not present

## 2017-08-08 DIAGNOSIS — R1032 Left lower quadrant pain: Secondary | ICD-10-CM | POA: Diagnosis not present

## 2017-08-08 DIAGNOSIS — R399 Unspecified symptoms and signs involving the genitourinary system: Secondary | ICD-10-CM | POA: Diagnosis not present

## 2017-08-08 DIAGNOSIS — Z8719 Personal history of other diseases of the digestive system: Secondary | ICD-10-CM | POA: Diagnosis not present

## 2017-08-09 DIAGNOSIS — K5792 Diverticulitis of intestine, part unspecified, without perforation or abscess without bleeding: Secondary | ICD-10-CM | POA: Diagnosis not present

## 2017-08-10 DIAGNOSIS — R1032 Left lower quadrant pain: Secondary | ICD-10-CM | POA: Diagnosis not present

## 2017-08-12 DIAGNOSIS — R197 Diarrhea, unspecified: Secondary | ICD-10-CM | POA: Diagnosis not present

## 2017-08-12 DIAGNOSIS — K5792 Diverticulitis of intestine, part unspecified, without perforation or abscess without bleeding: Secondary | ICD-10-CM | POA: Diagnosis not present

## 2017-08-16 ENCOUNTER — Ambulatory Visit: Payer: 59 | Admitting: Family Medicine

## 2017-08-16 ENCOUNTER — Encounter: Payer: Self-pay | Admitting: Family Medicine

## 2017-08-16 VITALS — BP 118/80 | HR 100 | Temp 98.4°F | Resp 14 | Wt 155.3 lb

## 2017-08-16 DIAGNOSIS — K5732 Diverticulitis of large intestine without perforation or abscess without bleeding: Secondary | ICD-10-CM | POA: Diagnosis not present

## 2017-08-16 DIAGNOSIS — I10 Essential (primary) hypertension: Secondary | ICD-10-CM

## 2017-08-16 DIAGNOSIS — R946 Abnormal results of thyroid function studies: Secondary | ICD-10-CM | POA: Diagnosis not present

## 2017-08-16 DIAGNOSIS — E782 Mixed hyperlipidemia: Secondary | ICD-10-CM

## 2017-08-16 DIAGNOSIS — R7301 Impaired fasting glucose: Secondary | ICD-10-CM

## 2017-08-16 DIAGNOSIS — K219 Gastro-esophageal reflux disease without esophagitis: Secondary | ICD-10-CM

## 2017-08-16 DIAGNOSIS — G4700 Insomnia, unspecified: Secondary | ICD-10-CM

## 2017-08-16 DIAGNOSIS — N898 Other specified noninflammatory disorders of vagina: Secondary | ICD-10-CM

## 2017-08-16 MED ORDER — FLUCONAZOLE 150 MG PO TABS: 150.0000 mg | ORAL_TABLET | Freq: Once | ORAL | 0 refills | Status: AC

## 2017-08-16 NOTE — Assessment & Plan Note (Signed)
Check A1c and glucose; encouraged avoidance white bread, sugars

## 2017-08-16 NOTE — Assessment & Plan Note (Signed)
Process thoughts before bed

## 2017-08-16 NOTE — Patient Instructions (Addendum)
Caution: prolonged use of proton pump inhibitors like omeprazole (Prilosec), pantoprazole (Protonix), esomeprazole (Nexium), and others like Dexilant and Aciphex may increase your risk of pneumonia, Clostridium difficile colitis, osteoporosis, anemia and other health complications Try to limit or avoid triggers like coffee, caffeinated beverages, onions, chocolate, spicy foods, peppermint, acidic foods like pizza, spaghetti sauce, and orange juice Lose weight if you are overweight or obese Try elevating the head of your bed by placing a small wedge between your mattress and box springs to keep acid in the stomach at night instead of coming up into your esophagus  Try to process your thoughts quietly before bed and NOT in the bedroom No TV in the bedroom  Please do eat yogurt or kimchi or take a probiotic daily for the next month We want to replace the healthy germs in the gut If you notice foul, watery diarrhea in the next two months, schedule an appointment RIGHT AWAY or go to an urgent care or the emergency room if a holiday or over a weekend  Insomnia Insomnia is a sleep disorder that makes it difficult to fall asleep or to stay asleep. Insomnia can cause tiredness (fatigue), low energy, difficulty concentrating, mood swings, and poor performance at work or school. There are three different ways to classify insomnia:  Difficulty falling asleep.  Difficulty staying asleep.  Waking up too early in the morning.  Any type of insomnia can be long-term (chronic) or short-term (acute). Both are common. Short-term insomnia usually lasts for three months or less. Chronic insomnia occurs at least three times a week for longer than three months. What are the causes? Insomnia may be caused by another condition, situation, or substance, such as:  Anxiety.  Certain medicines.  Gastroesophageal reflux disease (GERD) or other gastrointestinal conditions.  Asthma or other breathing  conditions.  Restless legs syndrome, sleep apnea, or other sleep disorders.  Chronic pain.  Menopause. This may include hot flashes.  Stroke.  Abuse of alcohol, tobacco, or illegal drugs.  Depression.  Caffeine.  Neurological disorders, such as Alzheimer disease.  An overactive thyroid (hyperthyroidism).  The cause of insomnia may not be known. What increases the risk? Risk factors for insomnia include:  Gender. Women are more commonly affected than men.  Age. Insomnia is more common as you get older.  Stress. This may involve your professional or personal life.  Income. Insomnia is more common in people with lower income.  Lack of exercise.  Irregular work schedule or night shifts.  Traveling between different time zones.  What are the signs or symptoms? If you have insomnia, trouble falling asleep or trouble staying asleep is the main symptom. This may lead to other symptoms, such as:  Feeling fatigued.  Feeling nervous about going to sleep.  Not feeling rested in the morning.  Having trouble concentrating.  Feeling irritable, anxious, or depressed.  How is this treated? Treatment for insomnia depends on the cause. If your insomnia is caused by an underlying condition, treatment will focus on addressing the condition. Treatment may also include:  Medicines to help you sleep.  Counseling or therapy.  Lifestyle adjustments.  Follow these instructions at home:  Take medicines only as directed by your health care provider.  Keep regular sleeping and waking hours. Avoid naps.  Keep a sleep diary to help you and your health care provider figure out what could be causing your insomnia. Include: ? When you sleep. ? When you wake up during the night. ? How well you  sleep. ? How rested you feel the next day. ? Any side effects of medicines you are taking. ? What you eat and drink.  Make your bedroom a comfortable place where it is easy to fall  asleep: ? Put up shades or special blackout curtains to block light from outside. ? Use a white noise machine to block noise. ? Keep the temperature cool.  Exercise regularly as directed by your health care provider. Avoid exercising right before bedtime.  Use relaxation techniques to manage stress. Ask your health care provider to suggest some techniques that may work well for you. These may include: ? Breathing exercises. ? Routines to release muscle tension. ? Visualizing peaceful scenes.  Cut back on alcohol, caffeinated beverages, and cigarettes, especially close to bedtime. These can disrupt your sleep.  Do not overeat or eat spicy foods right before bedtime. This can lead to digestive discomfort that can make it hard for you to sleep.  Limit screen use before bedtime. This includes: ? Watching TV. ? Using your smartphone, tablet, and computer.  Stick to a routine. This can help you fall asleep faster. Try to do a quiet activity, brush your teeth, and go to bed at the same time each night.  Get out of bed if you are still awake after 15 minutes of trying to sleep. Keep the lights down, but try reading or doing a quiet activity. When you feel sleepy, go back to bed.  Make sure that you drive carefully. Avoid driving if you feel very sleepy.  Keep all follow-up appointments as directed by your health care provider. This is important. Contact a health care provider if:  You are tired throughout the day or have trouble in your daily routine due to sleepiness.  You continue to have sleep problems or your sleep problems get worse. Get help right away if:  You have serious thoughts about hurting yourself or someone else. This information is not intended to replace advice given to you by your health care provider. Make sure you discuss any questions you have with your health care provider. Document Released: 07/06/2000 Document Revised: 12/09/2015 Document Reviewed:  04/09/2014 Elsevier Interactive Patient Education  [employer].

## 2017-08-16 NOTE — Assessment & Plan Note (Signed)
Limit PPI, avoid triggers

## 2017-08-16 NOTE — Assessment & Plan Note (Signed)
Well controlled 

## 2017-08-16 NOTE — Assessment & Plan Note (Signed)
Inherited component; avoiding fatty meats; continue statin (except for 3 days after diflucan); check lipids

## 2017-08-16 NOTE — Assessment & Plan Note (Signed)
Strong family hx; recheck TSH

## 2017-08-16 NOTE — Progress Notes (Signed)
BP 118/80   Pulse 100   Temp 98.4 F (36.9 C) (Oral)   Resp 14   Wt 155 lb 4.8 oz (70.4 kg)   SpO2 99%   BMI 26.66 kg/m    Subjective:    Patient ID: Whitney Cooper, female    DOB: [birth year], 57 y.o.   MRN: [id number]  HPI: Whitney Cooper is a 57 y.o. female  Chief Complaint  Patient presents with  . Follow-up    HPI  Patient is here for f/u She has had dizziness and has been seen by neurologist MRI brain 06/19/17 IMPRESSION: Normal examination for age. No cause of the presenting symptoms is identified. Few punctate cerebral hemispheric white matter foci, not likely significant.   Electronically Signed   By: Nelson Chimes M.D.   On: 06/19/2017 13:14  She also had diverticulitis and was seen at the [facility] clinic three times over the last two weeks No fevers; finished antibiotics; LLQ; about 90% better; previous episode maybe 10 years ago She had a WBC of 13.7 and then 15.3k, not checked again since (reviewed in [place])  Hypothyroidism; overall energy is medium low, but has had a lot going on (see above); wore out after work earlier this week; diarrhea for days; vaginal itching, went to drugstore and got vaginal yeast medicine; father took thyroid medicine, sister also has thyroid issues  High cholesterol; avoiding fatty meats; not much meat any more; no red meat except maybe once a quarter; no hamburger; on statin  GERD; has not taken PPI for two weeks; was taking nexium OTC if reflux got bad for a few days  Stopped the remeron, weaned off; it was giving her bad dreams; using melatonin to relax at night; took TCA for a while, but that got stopped  Prediabetes; neither mother or father had DM, but uncle and aunt and cousin; drinks water  Depression screen [facility] 2/9 08/16/2017 06/20/2017 02/12/2017 07/20/2016 05/08/2016  Decreased Interest 0 0 0 0 0  Down, Depressed, Hopeless 0 0 0 0 0  PHQ - 2 Score 0 0 0 0 0    Relevant past medical, surgical, family  and social history reviewed Past Medical History:  Diagnosis Date  . Beat, premature ventricular 06/21/2015  . Bundle branch block   . Diverticulitis of colon 07/07/2009  . Dry eyes   . Essential (primary) hypertension 06/21/2015  . Fibromyalgia   . GERD (gastroesophageal reflux disease)   . History of kidney stones   . Hyperlipidemia   . Hypertension   . IFG (impaired fasting glucose) 01/24/2015  . OAB (overactive bladder)   . Osteoarthritis   . Osteopenia determined by x-ray 02/10/2016  . Recurrent UTI   . Vertigo    Past Surgical History:  Procedure Laterality Date  . COLONOSCOPY WITH PROPOFOL N/A 11/11/2015   Procedure: COLONOSCOPY WITH PROPOFOL;  Surgeon: Lollie Sails, MD;  Location: [facility] ENDOSCOPY;  Service: Endoscopy;  Laterality: N/A;  . TOTAL ABDOMINAL HYSTERECTOMY  Jan 2012   Complete, due to heavy bleeding and grape-fruit size fibroid    Family History  Problem Relation Age of Onset  . Hypertension Mother   . Cancer Mother        skin  . Heart disease Father        CHF  . Hypertension Father   . Stroke Father        mini strokes  . Cancer Father        skin  . Heart attack  Father   . Thyroid disease Father   . Ovarian cancer Maternal Grandmother 66  . Cancer Maternal Grandmother        ovarian/brain  . Heart attack Maternal Grandfather   . Heart attack Paternal Grandfather   . Diabetes Maternal Uncle   . Thyroid disease Sister   . Kidney disease Paternal Grandmother   . Breast cancer Neg Hx    Social History   Tobacco Use  . Smoking status: Never Smoker  . Smokeless tobacco: Never Used  Substance Use Topics  . Alcohol use: No  . Drug use: No    Interim medical history since last visit reviewed. Allergies and medications reviewed  Review of Systems Per HPI unless specifically indicated above     Objective:    BP 118/80   Pulse 100   Temp 98.4 F (36.9 C) (Oral)   Resp 14   Wt 155 lb 4.8 oz (70.4 kg)   SpO2 99%   BMI 26.66 kg/m     Wt Readings from Last 3 Encounters:  08/16/17 155 lb 4.8 oz (70.4 kg)  06/20/17 168 lb 14.4 oz (76.6 kg)  05/07/17 164 lb 3.2 oz (74.5 kg)    Physical Exam  Constitutional: She appears well-developed and well-nourished. No distress.  Weight gain since last visit  Eyes: EOM are normal. No scleral icterus.  Neck: No thyromegaly present.  Cardiovascular: Normal rate, regular rhythm and normal heart sounds.  No murmur heard. Pulmonary/Chest: Effort normal and breath sounds normal.  Abdominal: Soft. Bowel sounds are normal. She exhibits no distension. There is no tenderness. There is no rigidity and no guarding.  Musculoskeletal: Normal range of motion. She exhibits no edema.  Neurological: She is alert.  Skin: Skin is warm. She is not diaphoretic. No pallor.  Psychiatric: She has a normal mood and affect. Her behavior is normal. Her mood appears not anxious. She does not exhibit a depressed mood.   Results for orders placed or performed during the hospital encounter of 06/19/17  Comprehensive metabolic panel  Result Value Ref Range   Sodium 138 135 - 145 mmol/L   Potassium 4.4 3.5 - 5.1 mmol/L   Chloride 104 101 - 111 mmol/L   CO2 28 22 - 32 mmol/L   Glucose, Bld 104 (H) 65 - 99 mg/dL   BUN 15 6 - 20 mg/dL   Creatinine, Ser 0.66 0.44 - 1.00 mg/dL   Calcium 9.3 8.9 - 10.3 mg/dL   Total Protein 8.0 6.5 - 8.1 g/dL   Albumin 4.7 3.5 - 5.0 g/dL   AST 24 15 - 41 U/L   ALT 30 14 - 54 U/L   Alkaline Phosphatase 67 38 - 126 U/L   Total Bilirubin 0.6 0.3 - 1.2 mg/dL   GFR calc non Af Amer >60 >60 mL/min   GFR calc Af Amer >60 >60 mL/min   Anion gap 6 5 - 15      Assessment & Plan:   Problem List Items Addressed This Visit      Cardiovascular and Mediastinum   Essential (primary) hypertension - Primary (Chronic)    Well-controlled        Digestive   GERD (gastroesophageal reflux disease) (Chronic)    Limit PPI, avoid triggers        Endocrine   IFG (impaired fasting  glucose) (Chronic)    Check A1c and glucose; encouraged avoidance white bread, sugars      Relevant Orders   Hemoglobin A1c  Other   Insomnia    Process thoughts before bed      Hyperlipidemia (Chronic)    Inherited component; avoiding fatty meats; continue statin (except for 3 days after diflucan); check lipids      Relevant Orders   Lipid panel   Abnormal thyroid function test    Strong family hx; recheck TSH      Relevant Orders   TSH    Other Visit Diagnoses    Diverticulitis of large intestine without perforation or abscess without bleeding       Relevant Orders   Comprehensive metabolic panel   CBC with Differential/Platelet   Vaginal itching           Follow up plan: Return in about 6 months (around 02/13/2018) for follow-up visit with Dr. Sanda Klein.  An after-visit summary was printed and given to the patient at [facility].  Please see the patient instructions which may contain other information and recommendations beyond what is mentioned above in the assessment and plan.  Meds ordered this encounter  Medications  . fluconazole (DIFLUCAN) 150 MG tablet    Sig: Take 1 tablet (150 mg total) by mouth once for 1 dose. No cholesterol medicine for 3 days    Dispense:  1 tablet    Refill:  0    Orders Placed This Encounter  Procedures  . Comprehensive metabolic panel  . CBC with Differential/Platelet  . Hemoglobin A1c  . Lipid panel  . TSH

## 2017-08-17 LAB — LIPID PANEL
CHOL/HDL RATIO: 3 ratio (ref 0.0–4.4)
Cholesterol, Total: 135 mg/dL (ref 100–199)
HDL: 45 mg/dL (ref >39)
LDL Calculated: 69 mg/dL (ref 0–99)
Triglycerides: 103 mg/dL (ref 0–149)
VLDL CHOLESTEROL CAL: 21 mg/dL (ref 5–40)

## 2017-08-17 LAB — CBC WITH DIFFERENTIAL/PLATELET
Basophils Absolute: 0 10*3/uL (ref 0.0–0.2)
Basos: 0 %
EOS (ABSOLUTE): 0.2 10*3/uL (ref 0.0–0.4)
Eos: 2 %
HEMOGLOBIN: 14.7 g/dL (ref 11.1–15.9)
Hematocrit: 40.6 % (ref 34.0–46.6)
Immature Grans (Abs): 0 10*3/uL (ref 0.0–0.1)
Immature Granulocytes: 0 %
LYMPHS ABS: 2 10*3/uL (ref 0.7–3.1)
Lymphs: 21 %
MCH: 29.8 pg (ref 26.6–33.0)
MCHC: 36.2 g/dL — AB (ref 31.5–35.7)
MCV: 82 fL (ref 79–97)
MONOCYTES: 8 %
MONOS ABS: 0.8 10*3/uL (ref 0.1–0.9)
NEUTROS ABS: 6.9 10*3/uL (ref 1.4–7.0)
Neutrophils: 69 %
Platelets: 364 10*3/uL (ref 150–379)
RBC: 4.93 x10E6/uL (ref 3.77–5.28)
RDW: 12.6 % (ref 12.3–15.4)
WBC: 9.9 10*3/uL (ref 3.4–10.8)

## 2017-08-17 LAB — HEMOGLOBIN A1C
Est. average glucose Bld gHb Est-mCnc: 105 mg/dL
Hgb A1c MFr Bld: 5.3 % (ref 4.8–5.6)

## 2017-08-17 LAB — COMPREHENSIVE METABOLIC PANEL
ALBUMIN: 4.6 g/dL (ref 3.5–5.5)
ALK PHOS: 70 IU/L (ref 39–117)
ALT: 17 IU/L (ref 0–32)
AST: 22 IU/L (ref 0–40)
Albumin/Globulin Ratio: 2 (ref 1.2–2.2)
BILIRUBIN TOTAL: 0.3 mg/dL (ref 0.0–1.2)
BUN / CREAT RATIO: 6 — AB (ref 9–23)
BUN: 5 mg/dL — AB (ref 6–24)
CO2: 23 mmol/L (ref 20–29)
Calcium: 10 mg/dL (ref 8.7–10.2)
Chloride: 103 mmol/L (ref 96–106)
Creatinine, Ser: 0.77 mg/dL (ref 0.57–1.00)
GFR calc non Af Amer: 85 mL/min/{1.73_m2} (ref >59)
GFR, EST AFRICAN AMERICAN: 98 mL/min/{1.73_m2} (ref >59)
GLOBULIN, TOTAL: 2.3 g/dL (ref 1.5–4.5)
Glucose: 107 mg/dL — ABNORMAL HIGH (ref 65–99)
Potassium: 4.8 mmol/L (ref 3.5–5.2)
SODIUM: 143 mmol/L (ref 134–144)
TOTAL PROTEIN: 6.9 g/dL (ref 6.0–8.5)

## 2017-08-17 LAB — TSH: TSH: 3.07 u[IU]/mL (ref 0.450–4.500)

## 2017-10-17 ENCOUNTER — Ambulatory Visit: Payer: 59 | Admitting: Family Medicine

## 2017-10-17 ENCOUNTER — Encounter: Payer: Self-pay | Admitting: Family Medicine

## 2017-10-17 VITALS — BP 134/84 | HR 100 | Temp 98.2°F | Ht 63.5 in | Wt 146.1 lb

## 2017-10-17 DIAGNOSIS — J0101 Acute recurrent maxillary sinusitis: Secondary | ICD-10-CM | POA: Diagnosis not present

## 2017-10-17 DIAGNOSIS — J029 Acute pharyngitis, unspecified: Secondary | ICD-10-CM | POA: Diagnosis not present

## 2017-10-17 DIAGNOSIS — M5030 Other cervical disc degeneration, unspecified cervical region: Secondary | ICD-10-CM | POA: Diagnosis not present

## 2017-10-17 DIAGNOSIS — G4700 Insomnia, unspecified: Secondary | ICD-10-CM

## 2017-10-17 MED ORDER — ESCITALOPRAM OXALATE 10 MG PO TABS: 10.0000 mg | ORAL_TABLET | Freq: Every day | ORAL | 0 refills | Status: DC

## 2017-10-17 MED ORDER — AMOXICILLIN-POT CLAVULANATE 875-125 MG PO TABS: 1.0000 | ORAL_TABLET | Freq: Two times a day (BID) | ORAL | 0 refills | Status: AC

## 2017-10-17 MED ORDER — FLUCONAZOLE 150 MG PO TABS: 150.0000 mg | ORAL_TABLET | Freq: Once | ORAL | 0 refills | Status: AC

## 2017-10-17 NOTE — Progress Notes (Signed)
BP 134/84 (BP Location: Right Arm, Patient Position: Sitting, Cuff Size: Normal)   Pulse 100   Temp 98.2 F (36.8 C) (Oral)   Ht 5' 3.5" (1.613 m)   Wt 146 lb 1.6 oz (66.3 kg)   SpO2 98%   BMI 25.47 kg/m    Subjective:    Patient ID: Whitney Cooper, female    DOB: 12/29/1960, 57 y.o.   MRN: [id number]  HPI: Whitney Cooper is a 57 y.o. female  Chief Complaint  Patient presents with  . Neck Pain    Pt states that she is progressively getting worse, always left side, dx with DDD  . Sore Throat    Started last night  . Insomnia    Pt states that she is very stressed with work not able to sleep     HPI Patient is here for an acute visit Sore throat; started yesterday; low grade fever; pain across both cheeks medially; painful; no rash; one lady who works with her had strep a month ago, not close to her  Neck pain; she has had this issue for a few years; has DDD; burning in the left shoulder; pain and spasms in the neck; she has muscle relaxers and uses them once or twice a week; she was in 3 really bad wrecks  Insomnia; she has taken melatonin and falls asleep within 30 minutes; up again in 3-4 hours and cannot fall back to sleep; met with a guy last year about this; saw a sleep specialist and it's all work related; extremely stressed; they have some people hired but feels like she has been beat into the ground; doing the same thing since 1981 and was going to give it the first quarter but still pretty stressful; no counseling through work; I suggested seeing a counselor in the community; things pop in her head when she wakes up in the middle of the night; pulm put her on medicine that gave her horrible dreams; she used to take amitriptyline in the past  Depression screen [facility] 2/9 10/17/2017 08/16/2017 06/20/2017 02/12/2017 07/20/2016  Decreased Interest 0 0 0 0 0  Down, Depressed, Hopeless 0 0 0 0 0  PHQ - 2 Score 0 0 0 0 0    Relevant past medical, surgical, family and social  history reviewed Past Medical History:  Diagnosis Date  . Beat, premature ventricular 06/21/2015  . Bundle branch block   . Diverticulitis of colon 07/07/2009  . Dry eyes   . Essential (primary) hypertension 06/21/2015  . Fibromyalgia   . GERD (gastroesophageal reflux disease)   . History of kidney stones   . Hyperlipidemia   . Hypertension   . IFG (impaired fasting glucose) 01/24/2015  . OAB (overactive bladder)   . Osteoarthritis   . Osteopenia determined by x-ray 02/10/2016  . Recurrent UTI   . Vertigo    Past Surgical History:  Procedure Laterality Date  . COLONOSCOPY WITH PROPOFOL N/A 11/11/2015   Procedure: COLONOSCOPY WITH PROPOFOL;  Surgeon: Lollie Sails, MD;  Location: [facility] ENDOSCOPY;  Service: Endoscopy;  Laterality: N/A;  . TOTAL ABDOMINAL HYSTERECTOMY  Jan 2012   Complete, due to heavy bleeding and grape-fruit size fibroid    Family History  Problem Relation Age of Onset  . Hypertension Mother   . Cancer Mother        skin  . Heart disease Father        CHF  . Hypertension Father   . Stroke Father  mini strokes  . Cancer Father        skin  . Heart attack Father   . Thyroid disease Father   . Ovarian cancer Maternal Grandmother 66  . Cancer Maternal Grandmother        ovarian/brain  . Heart attack Maternal Grandfather   . Heart attack Paternal Grandfather   . Diabetes Maternal Uncle   . Thyroid disease Sister   . Kidney disease Paternal Grandmother   . Breast cancer Neg Hx    Social History   Tobacco Use  . Smoking status: Never Smoker  . Smokeless tobacco: Never Used  Substance Use Topics  . Alcohol use: No  . Drug use: No    Interim medical history since last visit reviewed. Allergies and medications reviewed  Review of Systems Per HPI unless specifically indicated above     Objective:    BP 134/84 (BP Location: Right Arm, Patient Position: Sitting, Cuff Size: Normal)   Pulse 100   Temp 98.2 F (36.8 C) (Oral)   Ht 5' 3.5"  (1.613 m)   Wt 146 lb 1.6 oz (66.3 kg)   SpO2 98%   BMI 25.47 kg/m   Wt Readings from Last 3 Encounters:  10/17/17 146 lb 1.6 oz (66.3 kg)  08/16/17 155 lb 4.8 oz (70.4 kg)  06/20/17 168 lb 14.4 oz (76.6 kg)    Physical Exam  Constitutional: She appears well-developed and well-nourished.  HENT:  Right Ear: Tympanic membrane is not erythematous. No middle ear effusion.  Left Ear: Tympanic membrane is not erythematous.  No middle ear effusion.  Nose: Mucosal edema and rhinorrhea present.  Mouth/Throat: Mucous membranes are normal. No posterior oropharyngeal edema or posterior oropharyngeal erythema.  Eyes: EOM are normal. No scleral icterus.  Cardiovascular: Normal rate and regular rhythm.  Pulmonary/Chest: Effort normal and breath sounds normal.  Musculoskeletal:       Cervical back: She exhibits decreased range of motion and tenderness.  Trapezius tight and tender on the left  Skin: No rash (specifically, nothing to suggest shingles on the left side neck) noted.  Psychiatric: She has a normal mood and affect. Her behavior is normal.    Results for orders placed or performed in visit on 08/16/17  Comprehensive metabolic panel  Result Value Ref Range   Glucose 107 (H) 65 - 99 mg/dL   BUN 5 (L) 6 - 24 mg/dL   Creatinine, Ser 0.77 0.57 - 1.00 mg/dL   GFR calc non Af Amer 85 >59 mL/min/1.73   GFR calc Af Amer 98 >59 mL/min/1.73   BUN/Creatinine Ratio 6 (L) 9 - 23   Sodium 143 134 - 144 mmol/L   Potassium 4.8 3.5 - 5.2 mmol/L   Chloride 103 96 - 106 mmol/L   CO2 23 20 - 29 mmol/L   Calcium 10.0 8.7 - 10.2 mg/dL   Total Protein 6.9 6.0 - 8.5 g/dL   Albumin 4.6 3.5 - 5.5 g/dL   Globulin, Total 2.3 1.5 - 4.5 g/dL   Albumin/Globulin Ratio 2.0 1.2 - 2.2   Bilirubin Total 0.3 0.0 - 1.2 mg/dL   Alkaline Phosphatase 70 39 - 117 IU/L   AST 22 0 - 40 IU/L   ALT 17 0 - 32 IU/L  CBC with Differential/Platelet  Result Value Ref Range   WBC 9.9 3.4 - 10.8 x10E3/uL   RBC 4.93 3.77 -  5.28 x10E6/uL   Hemoglobin 14.7 11.1 - 15.9 g/dL   Hematocrit 40.6 34.0 - 46.6 %  MCV 82 79 - 97 fL   MCH 29.8 26.6 - 33.0 pg   MCHC 36.2 (H) 31.5 - 35.7 g/dL   RDW 12.6 12.3 - 15.4 %   Platelets 364 150 - 379 x10E3/uL   Neutrophils 69 Not Estab. %   Lymphs 21 Not Estab. %   Monocytes 8 Not Estab. %   Eos 2 Not Estab. %   Basos 0 Not Estab. %   Neutrophils Absolute 6.9 1.4 - 7.0 x10E3/uL   Lymphocytes Absolute 2.0 0.7 - 3.1 x10E3/uL   Monocytes Absolute 0.8 0.1 - 0.9 x10E3/uL   EOS (ABSOLUTE) 0.2 0.0 - 0.4 x10E3/uL   Basophils Absolute 0.0 0.0 - 0.2 x10E3/uL   Immature Granulocytes 0 Not Estab. %   Immature Grans (Abs) 0.0 0.0 - 0.1 x10E3/uL  Hemoglobin A1c  Result Value Ref Range   Hgb A1c MFr Bld 5.3 4.8 - 5.6 %   Est. average glucose Bld gHb Est-mCnc 105 mg/dL  Lipid panel  Result Value Ref Range   Cholesterol, Total 135 100 - 199 mg/dL   Triglycerides 103 0 - 149 mg/dL   HDL 45 >39 mg/dL   VLDL Cholesterol Cal 21 5 - 40 mg/dL   LDL Calculated 69 0 - 99 mg/dL   Chol/HDL Ratio 3.0 0.0 - 4.4 ratio  TSH  Result Value Ref Range   TSH 3.070 0.450 - 4.500 uIU/mL      Assessment & Plan:   Problem List Items Addressed This Visit      Other   Insomnia    Other Visit Diagnoses    Acute recurrent maxillary sinusitis    -  Primary   Relevant Medications   amoxicillin-clavulanate (AUGMENTIN) 875-125 MG tablet   Degenerative disc disease, cervical       Relevant Medications   ibuprofen (ADVIL,MOTRIN) 200 MG tablet   Other Relevant Orders   Ambulatory referral to Orthopedic Surgery   Pharyngitis, unspecified etiology           Follow up plan: Return in about 3 weeks (around 11/07/2017) for follow-up visit with Dr. Sanda Klein.  An after-visit summary was printed and given to the patient at [facility].  Please see the patient instructions which may contain other information and recommendations beyond what is mentioned above in the assessment and plan.  Meds ordered this  encounter  Medications  . escitalopram (LEXAPRO) 10 MG tablet    Sig: Take 1 tablet (10 mg total) by mouth daily.    Dispense:  30 tablet    Refill:  0  . amoxicillin-clavulanate (AUGMENTIN) 875-125 MG tablet    Sig: Take 1 tablet by mouth 2 (two) times daily for 10 days.    Dispense:  20 tablet    Refill:  0  . fluconazole (DIFLUCAN) 150 MG tablet    Sig: Take 1 tablet (150 mg total) by mouth once for 1 dose.    Dispense:  1 tablet    Refill:  0    Orders Placed This Encounter  Procedures  . Ambulatory referral to Orthopedic Surgery    ---------------------------------- Addendum: I called patient about the weight loss noted since previous visit; she had diverticulitis and lost 20 pounds in January; eating baby food and whatever she could tolerate; she went to the walk-in clinic at the [place] [facility], treated with antibiotics; high white count; close to hospitalization; she says the weight loss was from the GI stuff; she literally only had clear liquids for 3-4 days, really low fiber; they did  imaging; she had lost some on purpose, trying to watch what she eats, exercise; she just eating much; not worried about anything in particular; drinking fluids, Naked drinks; if weight loss continues unexplained, she'll let me know Her cough is better, nose not pouring; she slept real well last night

## 2017-10-17 NOTE — Patient Instructions (Addendum)
Process your thoughts in the evening with nothing else going on, and retrain your brain to NOT process your thoughts and day in the middle of the night Start the medicine for stress Do start to work with a counselor for cognitive behavioral therapy Start the antibiotics Use the yeast infection medicine if needed Please do eat yogurt or kimchi or take a probiotic daily for the next month We want to replace the healthy germs in the gut If you notice foul, watery diarrhea in the next two months, schedule an appointment RIGHT AWAY or go to an urgent care or the emergency room if a holiday or over a weekend  12 Ways to [place]  ?Anxiety is normal human sensation. It is what helped our ancestors survive the pitfalls of the wilderness. Anxiety is defined as experiencing worry or nervousness about an imminent event or something with an uncertain outcome. It is a feeling experienced by most people at some point in their lives. Anxiety can be triggered by a very personal issue, such as the illness of a loved one, or an event of global proportions, such as a refugee crisis. Some of the symptoms of anxiety are:  Feeling restless.  Having a feeling of impending danger.  Increased heart rate.  Rapid breathing. Sweating.  Shaking.  Weakness or feeling tired.  Difficulty concentrating on anything except the current worry.  Insomnia.  Stomach or bowel problems. What can we do about anxiety we may be feeling? There are many techniques to help manage stress and relax. Here are 12 ways you can reduce your anxiety almost immediately: 1. Turn off the constant feed of information. Take a social media sabbatical. Studies have shown that social media directly contributes to social anxiety.  2. Monitor your television viewing habits. Are you watching shows that are also contributing to your anxiety, such as 24-hour news stations? Try watching something else, or better yet, nothing at all. Instead, listen to  music, read an inspirational book or practice a hobby. 3. Eat nutritious meals. Also, don't skip meals and keep healthful snacks on hand. Hunger and poor diet contributes to feeling anxious. 4. Sleep. Sleeping on a regular schedule for at least seven to eight hours a night will do wonders for your outlook when you are awake. 5. Exercise. Regular exercise will help rid your body of that anxious energy and help you get more restful sleep. 6. Try deep (diaphragmatic) breathing. Inhale slowly through your nose for five seconds and exhale through your mouth. 7. Practice acceptance and gratitude. When anxiety hits, accept that there are things out of your control that shouldn't be of immediate concern.  8. Seek out humor. When anxiety strikes, watch a funny video, read jokes or call a friend who makes you laugh. Laughter is healing for our bodies and releases endorphins that are calming. 9. Stay positive. Take the effort to replace negative thoughts with positive ones. Try to see a stressful situation in a positive light. Try to come up with solutions rather than dwelling on the problem. 10. Figure out what triggers your anxiety. Keep a journal and make note of anxious moments and the events surrounding them. This will help you identify triggers you can avoid or even eliminate. 11. Talk to someone. Let a trusted friend, family member or even trained professional know that you are feeling overwhelmed and anxious. Verbalize what you are feeling and why.  12. Volunteer. If your anxiety is triggered by a crisis on a large scale,  become an advocate and work to resolve the problem that is causing you unease. Anxiety is often unwelcome and can become overwhelming. If not kept in check, it can become a disorder that could require medical treatment. However, if you take the time to care for yourself and avoid the triggers that make you anxious, you will be able to find moments of relaxation and clarity that make your  life much more enjoyable.

## 2017-10-27 ENCOUNTER — Emergency Department
Admission: EM | Admit: 2017-10-27 | Discharge: 2017-10-28 | Disposition: A | Discharge status: Home / Self Care | Payer: 59 | Attending: Emergency Medicine | Admitting: Emergency Medicine

## 2017-10-27 ENCOUNTER — Encounter: Payer: Self-pay | Admitting: Emergency Medicine

## 2017-10-27 ENCOUNTER — Emergency Department: Payer: 59

## 2017-10-27 ENCOUNTER — Other Ambulatory Visit: Payer: Self-pay

## 2017-10-27 DIAGNOSIS — Z79899 Other long term (current) drug therapy: Secondary | ICD-10-CM | POA: Diagnosis not present

## 2017-10-27 DIAGNOSIS — R42 Dizziness and giddiness: Secondary | ICD-10-CM | POA: Diagnosis not present

## 2017-10-27 DIAGNOSIS — R0602 Shortness of breath: Secondary | ICD-10-CM | POA: Diagnosis not present

## 2017-10-27 DIAGNOSIS — R0789 Other chest pain: Secondary | ICD-10-CM | POA: Insufficient documentation

## 2017-10-27 DIAGNOSIS — I10 Essential (primary) hypertension: Secondary | ICD-10-CM | POA: Insufficient documentation

## 2017-10-27 DIAGNOSIS — R05 Cough: Secondary | ICD-10-CM | POA: Insufficient documentation

## 2017-10-27 DIAGNOSIS — R0600 Dyspnea, unspecified: Secondary | ICD-10-CM | POA: Diagnosis not present

## 2017-10-27 LAB — CBC
HCT: 43.6 % (ref 35.0–47.0)
Hemoglobin: 14.5 g/dL (ref 12.0–16.0)
MCH: 28.4 pg (ref 26.0–34.0)
MCHC: 33.2 g/dL (ref 32.0–36.0)
MCV: 85.6 fL (ref 80.0–100.0)
PLATELETS: 305 10*3/uL (ref 150–440)
RBC: 5.09 MIL/uL (ref 3.80–5.20)
RDW: 13.6 % (ref 11.5–14.5)
WBC: 12.6 10*3/uL — ABNORMAL HIGH (ref 3.6–11.0)

## 2017-10-27 LAB — BASIC METABOLIC PANEL
Anion gap: 7 (ref 5–15)
BUN: 15 mg/dL (ref 6–20)
CALCIUM: 9.5 mg/dL (ref 8.9–10.3)
CHLORIDE: 106 mmol/L (ref 101–111)
CO2: 27 mmol/L (ref 22–32)
CREATININE: 0.78 mg/dL (ref 0.44–1.00)
GFR calc non Af Amer: >60 mL/min (ref >60)
Glucose, Bld: 119 mg/dL — ABNORMAL HIGH (ref 65–99)
Potassium: 3.8 mmol/L (ref 3.5–5.1)
Sodium: 140 mmol/L (ref 135–145)

## 2017-10-27 LAB — TROPONIN I

## 2017-10-27 MED ORDER — SODIUM CHLORIDE 0.9 % IV BOLUS
1000.0000 mL | Freq: Once | INTRAVENOUS | Status: AC
  Administered 2017-10-27: 1000 mL via INTRAVENOUS

## 2017-10-27 MED ORDER — IPRATROPIUM-ALBUTEROL 0.5-2.5 (3) MG/3ML IN SOLN
3.0000 mL | Freq: Once | RESPIRATORY_TRACT | Status: AC
  Administered 2017-10-27: 3 mL via RESPIRATORY_TRACT
  Filled 2017-10-27: qty 3

## 2017-10-27 MED ORDER — PREDNISONE 20 MG PO TABS
60.0000 mg | ORAL_TABLET | Freq: Once | ORAL | Status: AC
  Administered 2017-10-27: 60 mg via ORAL
  Filled 2017-10-27: qty 3

## 2017-10-27 NOTE — ED Provider Notes (Signed)
[facility] Emergency Department Provider Note   ____________________________________________   First MD Initiated Contact with Patient 10/27/17 2300     (approximate)  I have reviewed the triage vital signs and the nursing notes.   HISTORY  Chief Complaint Shortness of Breath and Dizziness    HPI Whitney Cooper is a 57 y.o. female who comes into the hospital today with some shortness of breath.  The patient states that she feels like she cannot get relief and she is been having some dizziness.  The patient states that she got up this morning and felt like she could not catch her breath.  She has had similar symptoms in the past but reports that is never lasted all day before.  The patient feels like she is choking and she has a lot of mucus.  The patient is currently being treated for a sinus infection and strep throat.  The patient is still taking Augmentin which she says she has for a few more days.  The patient has felt tight in her chest.  She denies any significant cough currently but reports that she was coughing so much last week that she might have busted some blood vessels in her eyes.  The patient does not have any problems with asthma or bronchitis.  She denies any sharp chest pains but reports that she does have some chronic neck and back pain.  The patient was concerned about the symptoms so she decided to come into the hospital today for further evaluation.   Past Medical History:  Diagnosis Date  . Beat, premature ventricular 06/21/2015  . Bundle branch block   . Diverticulitis of colon 07/07/2009  . Dry eyes   . Essential (primary) hypertension 06/21/2015  . Fibromyalgia   . GERD (gastroesophageal reflux disease)   . History of kidney stones   . Hyperlipidemia   . Hypertension   . IFG (impaired fasting glucose) 01/24/2015  . OAB (overactive bladder)   . Osteoarthritis   . Osteopenia determined by x-ray 02/10/2016  . Recurrent UTI   .  Vertigo     Patient Active Problem List   Diagnosis Date Noted  . Insomnia 04/10/2016  . Upper back pain, chronic 02/10/2016  . Neck pain, chronic 02/10/2016  . Preventative health care 01/20/2016  . Medication monitoring encounter 11/15/2015  . Essential (primary) hypertension 06/21/2015  . Combined fat and carbohydrate induced hyperlipemia 06/21/2015  . Beat, premature ventricular 06/21/2015  . Urinary frequency 03/15/2015  . Strangury 03/15/2015  . Incontinence 03/15/2015  . Right flank pain 03/15/2015  . Atrophic vaginitis 03/15/2015  . Abnormal thyroid function test 01/24/2015  . IFG (impaired fasting glucose) 01/24/2015  . Overweight (BMI 25.0-29.9) 01/19/2015  . Urinary retention with incomplete bladder emptying 01/19/2015  . Hyperlipidemia   . Osteoarthritis   . GERD (gastroesophageal reflux disease)   . Fibromyalgia   . Diverticulitis of colon 07/07/2009    Past Surgical History:  Procedure Laterality Date  . COLONOSCOPY WITH PROPOFOL N/A 11/11/2015   Procedure: COLONOSCOPY WITH PROPOFOL;  Surgeon: Lollie Sails, MD;  Location[facility] ENDOSCOPY;  Service: Endoscopy;  Laterality: N/A;  . TOTAL ABDOMINAL HYSTERECTOMY  Jan 2012   Complete, due to heavy bleeding and grape-fruit size fibroid     Prior to Admission medications   Medication Sig Start Date End Date Taking? Authorizing Provider  atorvastatin (LIPITOR) 20 MG tablet Take 1 tablet (20 mg total) by mouth at bedtime. 05/21/17   Arnetha Courser, MD  Cholecalciferol (VITAMIN D3) 1000 units CAPS Take 1 capsule (1,000 Units total) by mouth daily. 01/20/16   Arnetha Courser, MD  Coenzyme Q10 (CO Q-10) 100 MG CAPS Take by mouth daily.     [provider]  escitalopram (LEXAPRO) 10 MG tablet Take 1 tablet (10 mg total) by mouth daily. 10/17/17   Arnetha Courser, MD  esomeprazole (NEXIUM) 20 MG capsule Take 1 capsule (20 mg total) by mouth as needed. Patient not taking: Reported on 10/17/2017 06/20/17   Arnetha Courser, MD  ibuprofen (ADVIL,MOTRIN) 200 MG tablet Take 200 mg by mouth every 6 (six) hours as needed.    [provider]  meclizine (ANTIVERT) 25 MG tablet Take 25 mg by mouth as needed.    [provider]  Melatonin 5 MG CAPS Take 5 mg by mouth at bedtime as needed.    [provider]  metoprolol succinate (TOPROL-XL) 50 MG 24 hr tablet Take 1 tablet (50 mg total) by mouth daily. 04/25/17   Arnetha Courser, MD  Multiple Vitamin (MULTI-VITAMINS) TABS Take by mouth daily.    [provider]  Omega-3 Fatty Acids (FISH OIL) 1000 MG CAPS Take 1 capsule by mouth daily.    [provider]  SUMAtriptan (IMITREX) 100 MG tablet Take 100 mg by mouth as needed. 05/31/17   [provider]    Allergies Codeine and Sulfa antibiotics  Family History  Problem Relation Age of Onset  . Hypertension Mother   . Cancer Mother        skin  . Heart disease Father        CHF  . Hypertension Father   . Stroke Father        mini strokes  . Cancer Father        skin  . Heart attack Father   . Thyroid disease Father   . Ovarian cancer Maternal Grandmother 66  . Cancer Maternal Grandmother        ovarian/brain  . Heart attack Maternal Grandfather   . Heart attack Paternal Grandfather   . Diabetes Maternal Uncle   . Thyroid disease Sister   . Kidney disease Paternal Grandmother   . Breast cancer Neg Hx     Social History Social History   Tobacco Use  . Smoking status: Never Smoker  . Smokeless tobacco: Never Used  Substance Use Topics  . Alcohol use: No  . Drug use: No    Review of Systems  Constitutional:  fever Eyes: No visual changes. ENT: sore throat. Cardiovascular: Denies chest pain. Respiratory: Cough and shortness of breath. Gastrointestinal: No abdominal pain.  No nausea, no vomiting.  No diarrhea.  No constipation. Genitourinary: Negative for dysuria. Musculoskeletal: Negative for back pain. Skin: Negative for  rash. Neurological: Dizziness   ____________________________________________   PHYSICAL EXAM:  VITAL SIGNS: ED Triage Vitals  Enc Vitals Group     BP 10/27/17 2008 (!) 158/80     Pulse Rate 10/27/17 2008 77     Resp 10/27/17 2008 17     Temp 10/27/17 2008 98.4 F (36.9 C)     Temp Source 10/27/17 2008 Oral     SpO2 10/27/17 2008 98 %     Weight 10/27/17 2232 146 lb (66.2 kg)     Height 10/27/17 2232 5\' 4"  (1.626 m)     Head Circumference --      Peak Flow --      Pain Score --  Pain Loc --      Pain Edu? --      Excl. in [place]? --     Constitutional: Alert and oriented. Well appearing and in mild distress. Eyes: Conjunctivae are normal. PERRL. EOMI. Head: Atraumatic. Nose: No congestion/rhinnorhea. Mouth/Throat: Mucous membranes are moist.  Oropharynx non-erythematous. Cardiovascular: Normal rate, regular rhythm. Grossly normal heart sounds.  Good peripheral circulation. Respiratory: Normal respiratory effort.  No retractions. Lungs CTAB. Gastrointestinal: Soft and nontender. No distention.  Positive bowel sounds Musculoskeletal: No lower extremity tenderness nor edema.   Neurologic:  Normal speech and language.  Skin:  Skin is warm, dry and intact.  Psychiatric: Mood and affect are normal.   ____________________________________________   LABS (all labs ordered are listed, but only abnormal results are displayed)  Labs Reviewed  BASIC METABOLIC PANEL - Abnormal; Notable for the following components:      Result Value   Glucose, Bld 119 (*)    All other components within normal limits  CBC - Abnormal; Notable for the following components:   WBC 12.6 (*)    All other components within normal limits  TROPONIN I  TROPONIN I   ____________________________________________  EKG  ED ECG REPORT I, Loney Hering, the attending physician, personally viewed and interpreted this ECG.   Date: 10/27/2017  EKG Time: 2016  Rate: 75  Rhythm: normal sinus  rhythm  Axis: normal  Intervals:right bundle branch block  ST&T Change: flipped t wave in lead III  ____________________________________________  RADIOLOGY  ED MD interpretation:  CXR: No acute disease  Official radiology report(s): Dg Chest 2 View  Result Date: 10/27/2017 CLINICAL DATA:  Dyspnea all day EXAM: CHEST - 2 VIEW COMPARISON:  12/30/2013 FINDINGS: The heart size and mediastinal contours are within normal limits. Both lungs are clear. The visualized skeletal structures are unremarkable. IMPRESSION: No active cardiopulmonary disease. Electronically Signed   By: Ashley Royalty M.D.   On: 10/27/2017 21:02    ____________________________________________   PROCEDURES  Procedure(s) performed: None  Procedures  Critical Care performed: No  ____________________________________________   INITIAL IMPRESSION / ASSESSMENT AND PLAN / ED COURSE  As part of my medical decision making, I reviewed the following data within the [facility]    This is a 57 year old female who comes into the hospital today with some shortness of breath and dizziness.  The patient has been recently treated for strep throat and sinusitis.  My differential diagnosis includes bronchitis, pneumonia, acute coronary syndrome  The patient did receive a CBC, CMP and a troponin which were all unremarkable.  The patient did have a white blood cell count of 12.6 but she is currently on Augmentin.  The patient also had a chest x-ray which was negative.  Although the patient's lungs are clear since she described herself as having some tightness I would give her some prednisone as well as a DuoNeb treatment.  The patient will also receive a liter of normal saline and she will be reassessed.  I will repeat the patient's troponin to ensure that she is not having any acute coronary symptoms and she will be reassessed.     I did go back into reassess the patient.  She states that the breathing treatment  did not help.  I listen to the patient's lungs again and they were clear with no wheezing or prolonged expiratory phase.  The patient asked if this could be likely anxiety.  She reports that she started a new anxiety medication on  April 6 and after taking 1 dose she did feel dizzy.  She does attribute some of her dizziness to that medication.  She states that she feels she could be having som anxiety causing her symptoms.  I did tell her that that was possible.  The patient also likely has some postnasal drip causing her symptoms as well which is why she is clearing her throat and feeling like she has mucus stuck in her throat.  I did recommend using some nasal saline as well as a humidifier at home.  The patient will be discharged to follow-up with her primary care physician. ____________________________________________   FINAL CLINICAL IMPRESSION(S) / ED DIAGNOSES  Final diagnoses:  Dizziness  Shortness of breath     ED Discharge Orders    None       Note:  This document was prepared using Dragon voice recognition software and may include unintentional dictation errors.    Loney Hering, MD 10/28/17 [phone]

## 2017-10-27 NOTE — ED Notes (Signed)
Pt to the er for SOB that began at 0430. Pt states she cant take a deep breath. Pt states laying flat on her back makes it worse. No worsening with ambulation. Dry cough present. Pt being treated for sinus infection. Sats are 100%. No distress noted.

## 2017-10-27 NOTE — ED Triage Notes (Addendum)
Pt says she woke around 4am today feeling like she was having difficulty catching her breath, "like I couldn't get relaxed after that"; pt says the SOB feeling has lingered all day; reports pain to left shoulder but history of pain to same;  pt says when she woke around 8am she felt dizzy and that has been intermittent all day; pt adds she started lexapro yesterday but did not take it today as it made her feel dizzy yesterday-groggy and sleepy; "I did not feel good after taking that at all"; pt has been on antibiotics for over a week for sinus infection; pt says she has not had any trouble today walking, talking, grasping, swallowing; decreased appetite today but says when she swallows she does feel like she's having difficulty, almost choking

## 2017-10-28 LAB — TROPONIN I: Troponin I: <0.03 ng/mL (ref <0.03)

## 2017-10-28 NOTE — Discharge Instructions (Addendum)
You have been evaluated for shortness of breath and dizziness. Your evaluation is negative at this point. Please follow up with your primary care physician for further evaluation of your symptoms.

## 2017-11-04 ENCOUNTER — Encounter: Payer: Self-pay | Admitting: Nurse Practitioner

## 2017-11-04 ENCOUNTER — Ambulatory Visit: Payer: 59 | Admitting: Nurse Practitioner

## 2017-11-04 VITALS — BP 130/68 | HR 75 | Temp 97.9°F | Resp 16 | Ht 63.25 in | Wt 147.5 lb

## 2017-11-04 DIAGNOSIS — F410 Panic disorder [episodic paroxysmal anxiety] without agoraphobia: Secondary | ICD-10-CM | POA: Diagnosis not present

## 2017-11-04 DIAGNOSIS — R0982 Postnasal drip: Secondary | ICD-10-CM | POA: Diagnosis not present

## 2017-11-04 DIAGNOSIS — F411 Generalized anxiety disorder: Secondary | ICD-10-CM | POA: Diagnosis not present

## 2017-11-04 MED ORDER — HYDROXYZINE HCL 25 MG PO TABS: 25.0000 mg | ORAL_TABLET | Freq: Three times a day (TID) | ORAL | 0 refills | Status: DC | PRN

## 2017-11-04 MED ORDER — ESCITALOPRAM OXALATE 10 MG PO TABS: 10.0000 mg | ORAL_TABLET | Freq: Every day | ORAL | 0 refills | Status: DC

## 2017-11-04 NOTE — Patient Instructions (Addendum)
- Counseling - Lexapro daily- let us know if it doesn't work for you (2 weeks- others will notice a difference) (4-6 weeks you should notice some changes) - Atarax as needed for panic/palpitations Living With Anxiety After being diagnosed with an anxiety disorder, you may be relieved to know why you have felt or behaved a certain way. It is natural to also feel overwhelmed about the treatment ahead and what it will mean for your life. With care and support, you can manage this condition and recover from it. How to cope with anxiety Dealing with stress Stress is your body's reaction to life changes and events, both good and bad. Stress can last just a few hours or it can be ongoing. Stress can play a major role in anxiety, so it is important to learn both how to cope with stress and how to think about it differently. Talk with your health care provider or a counselor to learn more about stress reduction. He or she may suggest some stress reduction techniques, such as:  Music therapy. This can include creating or listening to music that you enjoy and that inspires you.  Mindfulness-based meditation. This involves being aware of your normal breaths, rather than trying to control your breathing. It can be done while sitting or walking.  Centering prayer. This is a kind of meditation that involves focusing on a word, phrase, or sacred image that is meaningful to you and that brings you peace.  Deep breathing. To do this, expand your stomach and inhale slowly through your nose. Hold your breath for 3-5 seconds. Then exhale slowly, allowing your stomach muscles to relax.  Self-talk. This is a skill where you identify thought patterns that lead to anxiety reactions and correct those thoughts.  Muscle relaxation. This involves tensing muscles then relaxing them.  Choose a stress reduction technique that fits your lifestyle and personality. Stress reduction techniques take time and practice. Set aside  5-15 minutes a day to do them. Therapists can offer training in these techniques. The training may be covered by some insurance plans. Other things you can do to manage stress include:  Keeping a stress diary. This can help you learn what triggers your stress and ways to control your response.  Thinking about how you respond to certain situations. You may not be able to control everything, but you can control your reaction.  Making time for activities that help you relax, and not feeling guilty about spending your time in this way.  Therapy combined with coping and stress-reduction skills provides the best chance for successful treatment. Medicines Medicines can help ease symptoms. Medicines for anxiety include:  Anti-anxiety drugs.  Antidepressants.  Beta-blockers.  Medicines may be used as the main treatment for anxiety disorder, along with therapy, or if other treatments are not working. Medicines should be prescribed by a health care provider. Relationships Relationships can play a big part in helping you recover. Try to spend more time connecting with trusted friends and family members. Consider going to couples counseling, taking family education classes, or going to family therapy. Therapy can help you and others better understand the condition. How to recognize changes in your condition Everyone has a different response to treatment for anxiety. Recovery from anxiety happens when symptoms decrease and stop interfering with your daily activities at home or work. This may mean that you will start to:  Have better concentration and focus.  Sleep better.  Be less irritable.  Have more energy.  Have improved  memory.  It is important to recognize when your condition is getting worse. Contact your health care provider if your symptoms interfere with home or work and you do not feel like your condition is improving. Where to find help and support: You can get help and support from  these sources:  Self-help groups.  Online and [employer].  A trusted spiritual leader.  Couples counseling.  Family education classes.  Family therapy.  Follow these instructions at home:  Eat a healthy diet that includes plenty of vegetables, fruits, whole grains, low-fat dairy products, and lean protein. Do not eat a lot of foods that are high in solid fats, added sugars, or salt.  Exercise. Most adults should do the following: ? Exercise for at least 150 minutes each week. The exercise should increase your heart rate and make you sweat (moderate-intensity exercise). ? Strengthening exercises at least twice a week.  Cut down on caffeine, tobacco, alcohol, and other potentially harmful substances.  Get the right amount and quality of sleep. Most adults need 7-9 hours of sleep each night.  Make choices that simplify your life.  Take over-the-counter and prescription medicines only as told by your health care provider.  Avoid caffeine, alcohol, and certain over-the-counter cold medicines. These may make you feel worse. Ask your pharmacist which medicines to avoid.  Keep all follow-up visits as told by your health care provider. This is important. Questions to ask your health care provider  Would I benefit from therapy?  How often should I follow up with a health care provider?  How long do I need to take medicine?  Are there any long-term side effects of my medicine?  Are there any alternatives to taking medicine? Contact a health care provider if:  You have a hard time staying focused or finishing daily tasks.  You spend many hours a day feeling worried about everyday life.  You become exhausted by worry.  You start to have headaches, feel tense, or have nausea.  You urinate more than normal.  You have diarrhea. Get help right away if:  You have a racing heart and shortness of breath.  You have thoughts of hurting yourself or others. If you  ever feel like you may hurt yourself or others, or have thoughts about taking your own life, get help right away. You can go to your nearest emergency department or call:  Your local emergency services (911 in the [place]  A suicide crisis helpline, such as the [facility] at 337-855-3609. This is open 24-hours a day.  Summary  Taking steps to deal with stress can help calm you.  Medicines cannot cure anxiety disorders, but they can help ease symptoms.  Family, friends, and partners can play a big part in helping you recover from an anxiety disorder. This information is not intended to replace advice given to you by your health care provider. Make sure you discuss any questions you have with your health care provider. Document Released: 07/03/2016 Document Revised: 07/03/2016 Document Reviewed: 07/03/2016 Elsevier Interactive Patient Education  [employer].

## 2017-11-04 NOTE — Progress Notes (Addendum)
Name: Whitney Cooper   MRN: [id number]    DOB: 01-10-1961   Date:11/04/2017       Progress Note  Subjective  Chief Complaint  Chief Complaint  Patient presents with  . Dizziness    Went to ER on 10/27/2017 for SOB, Dizziness. After taking Lexapro-they did a EKG, blood test and Chest X-Ray-pt .did not have pneumonia  . Anxiety    Was nervous to start Lexapro due to side effects, only took a couple of days and quit  . Follow-up    3 week F/U    HPI  ER follow-up  Went to ER on 10/27/2017 due to shortness of breath- felt she could not get a deep breath, with accompanying dizziness. CBC, CMP and trop- unremarkable. Mildly elevated WBC- was finishing up augmentin from sinus infection- which is cleared now but she has post nasal drip. Chest-xray was negative- received duoneb and prednisone with no relief of symptoms. States feels it may have been anxiety related. Endorses PND with dry cough in the morning. Taking flonase PRN. Neg sore throat, productive cough, ear pain, facial pain.  Anxiety Lexapro only took a few but was feeling drowsy.  Patient states work has been very overwhelming- short staffed for a year but has new people hired. Feels that it will get better- states has been trying to stay positive, once a month massage, listening to positive things at night. Seeking counselor to speak to. States she does not feel depressed but is frustrated with work which is exhausting and feels tired all the time. Patient state she has had episodes of increased anxiety with accompanied palpitations, no cp.   GAD 7 : Generalized Anxiety Score 11/04/2017  Nervous, Anxious, on Edge 3  Control/stop worrying 3  Worry too much - different things 3  Trouble relaxing 3  Restless 3  Easily annoyed or irritable 2  Afraid - awful might happen 1  Total GAD 7 Score 18  Anxiety Difficulty Somewhat difficult   Depression screen PHQ 2/9 11/04/2017  Decreased Interest 2  Down, Depressed, Hopeless 1  PHQ - 2 Score  3  Altered sleeping 0  Tired, decreased energy 3  Change in appetite 0  Feeling bad or failure about yourself  0  Trouble concentrating 0  Moving slowly or fidgety/restless 0  Suicidal thoughts 0  PHQ-9 Score 6  Difficult doing work/chores Not difficult at all       Patient Active Problem List   Diagnosis Date Noted  . Insomnia 04/10/2016  . Upper back pain, chronic 02/10/2016  . Neck pain, chronic 02/10/2016  . Preventative health care 01/20/2016  . Medication monitoring encounter 11/15/2015  . Essential (primary) hypertension 06/21/2015  . Combined fat and carbohydrate induced hyperlipemia 06/21/2015  . Beat, premature ventricular 06/21/2015  . Urinary frequency 03/15/2015  . Strangury 03/15/2015  . Incontinence 03/15/2015  . Right flank pain 03/15/2015  . Atrophic vaginitis 03/15/2015  . Abnormal thyroid function test 01/24/2015  . IFG (impaired fasting glucose) 01/24/2015  . Overweight (BMI 25.0-29.9) 01/19/2015  . Urinary retention with incomplete bladder emptying 01/19/2015  . Hyperlipidemia   . Osteoarthritis   . GERD (gastroesophageal reflux disease)   . Fibromyalgia   . Diverticulitis of colon 07/07/2009    Past Medical History:  Diagnosis Date  . Beat, premature ventricular 06/21/2015  . Bundle branch block   . Diverticulitis of colon 07/07/2009  . Dry eyes   . Essential (primary) hypertension 06/21/2015  . Fibromyalgia   . GERD (  gastroesophageal reflux disease)   . History of kidney stones   . Hyperlipidemia   . Hypertension   . IFG (impaired fasting glucose) 01/24/2015  . OAB (overactive bladder)   . Osteoarthritis   . Osteopenia determined by x-ray 02/10/2016  . Recurrent UTI   . Vertigo     Past Surgical History:  Procedure Laterality Date  . COLONOSCOPY WITH PROPOFOL N/A 11/11/2015   Procedure: COLONOSCOPY WITH PROPOFOL;  Surgeon: Lollie Sails, MD;  Location: [facility] ENDOSCOPY;  Service: Endoscopy;  Laterality: N/A;  . TOTAL ABDOMINAL  HYSTERECTOMY  Jan 2012   Complete, due to heavy bleeding and grape-fruit size fibroid     Social History   Tobacco Use  . Smoking status: Never Smoker  . Smokeless tobacco: Never Used  Substance Use Topics  . Alcohol use: No     Current Outpatient Medications:  .  atorvastatin (LIPITOR) 20 MG tablet, Take 1 tablet (20 mg total) by mouth at bedtime., Disp: 30 tablet, Rfl: 5 .  Cholecalciferol (VITAMIN D3) 1000 units CAPS, Take 1 capsule (1,000 Units total) by mouth daily., Disp: , Rfl:  .  Coenzyme Q10 (CO Q-10) 100 MG CAPS, Take by mouth daily. , Disp: , Rfl:  .  esomeprazole (NEXIUM) 20 MG capsule, Take 1 capsule (20 mg total) by mouth as needed., Disp: 30 capsule, Rfl: 5 .  ibuprofen (ADVIL,MOTRIN) 200 MG tablet, Take 200 mg by mouth every 6 (six) hours as needed., Disp: , Rfl:  .  Melatonin 5 MG CAPS, Take 5 mg by mouth at bedtime as needed., Disp: , Rfl:  .  metoprolol succinate (TOPROL-XL) 50 MG 24 hr tablet, Take 1 tablet (50 mg total) by mouth daily., Disp: 30 tablet, Rfl: 11 .  Multiple Vitamin (MULTI-VITAMINS) TABS, Take 1 tablet by mouth daily. , Disp: , Rfl:  .  naproxen sodium (ALEVE) 220 MG tablet, Take 220 mg by mouth daily as needed., Disp: , Rfl:  .  Omega-3 Fatty Acids (FISH OIL) 1000 MG CAPS, Take 1 capsule by mouth daily., Disp: , Rfl:  .  SUMAtriptan (IMITREX) 100 MG tablet, Take 100 mg by mouth as needed., Disp: , Rfl:  .  escitalopram (LEXAPRO) 10 MG tablet, Take 1 tablet (10 mg total) by mouth daily. (Patient not taking: Reported on 11/04/2017), Disp: 30 tablet, Rfl: 0 .  meclizine (ANTIVERT) 25 MG tablet, Take 25 mg by mouth as needed., Disp: , Rfl:   Allergies  Allergen Reactions  . Codeine Other (See Comments)  . Sulfa Antibiotics Rash    ROS  No other specific complaints in a complete review of systems (except as listed in HPI above).  Objective  Vitals:   11/04/17 0931  BP: 130/68  Pulse: 75  Resp: 16  Temp: 97.9 F (36.6 C)  TempSrc: Oral   SpO2: 96%  Weight: 147 lb 8 oz (66.9 kg)  Height: 5' 3.25" (1.607 m)     Body mass index is 25.92 kg/m.  Nursing Note and Vital Signs reviewed.  Physical Exam   Constitutional: Patient appears well-developed and well-nourished.  No distress.  Cardiovascular: Normal rate, regular rhythm, S1/S2 present.  No murmur or rub heard.  Pulmonary/Chest: Effort normal and breath sounds clear. No respiratory distress or retractions. Abdominal: Soft and non-tender, bowel sounds present  Psychiatric: Patient has a normal mood and affect. behavior is normal. Judgment and thought content normal.  No results found for this or any previous visit (from the past 72 hour(s)).  Assessment & Plan  Discussed counseling- states she reached out to church member that is a counselor who is helping her located someone to counsel. Feel anxiety is very situational with work but has been ongoing for a year now- she does not feel depressed but more overwhelmed with work. Discussed how medications work and also importance of addressing situational issue. Willing to take lexapro at night due to drowsiness and will let us know if it is not working. vistaril for acute anxiety. Follow up in one month  1. Panic attacks - hydrOXYzine (ATARAX/VISTARIL) 25 MG tablet; Take 1 tablet (25 mg total) by mouth 3 (three) times daily as needed for anxiety.  Dispense: 30 tablet; Refill: 0  2. GAD (generalized anxiety disorder) - escitalopram (LEXAPRO) 10 MG tablet; Take 1 tablet (10 mg total) by mouth daily.  Dispense: 30 tablet; Refill: 0  3. PND (post-nasal drip) - OTC antihistamine   -Red flags and when to present for emergency care or RTC including fever >101.81F, chest pain, shortness of breath, new/worsening/un-resolving symptoms,  reviewed with patient at time of visit. Follow up and care instructions discussed and provided in AVS.  ---------------------------------------- I have reviewed this encounter including the  documentation in this note and/or discussed this patient with the provider, Suezanne Cheshire DNP AGNP-C. I am certifying that I agree with the content of this note as supervising physician. Enid Derry, [facility] 11/06/2017, 7:17 PM

## 2017-11-07 ENCOUNTER — Ambulatory Visit: Payer: 59 | Admitting: Nurse Practitioner

## 2017-11-07 ENCOUNTER — Ambulatory Visit: Payer: Self-pay | Admitting: *Deleted

## 2017-11-07 ENCOUNTER — Telehealth: Payer: Self-pay | Admitting: Family Medicine

## 2017-11-07 DIAGNOSIS — R42 Dizziness and giddiness: Secondary | ICD-10-CM | POA: Diagnosis not present

## 2017-11-07 DIAGNOSIS — M542 Cervicalgia: Principal | ICD-10-CM

## 2017-11-07 DIAGNOSIS — G8929 Other chronic pain: Secondary | ICD-10-CM

## 2017-11-07 DIAGNOSIS — M549 Dorsalgia, unspecified: Secondary | ICD-10-CM

## 2017-11-07 NOTE — Telephone Encounter (Signed)
I contacted this patient to get clarity of what is actually needed. She stated that [facility] stated that she needed a copy of the images on CD prior to being scheduled at their facility. She called to inform them that she has gotten the CD and was ready to schedule but was unable to speak with someone regarding an appointment. I informed her that she could use their ortho walk-in clinic that is from 1pm to 7pm. She stated that she was not aware of that and will try that maybe tomorrow.

## 2017-11-07 NOTE — Telephone Encounter (Signed)
Pt notified and verbalized understanding.

## 2017-11-07 NOTE — Addendum Note (Signed)
Addended by: Docia Furl on: 11/07/2017 04:31 PM   Modules accepted: Orders

## 2017-11-07 NOTE — Telephone Encounter (Signed)
Pt called because she is having side effects from taking Lexapro. She had a headache after taking the a whole pill after speaking with her pharmacist. She developed a headache the next morning along with nausea and dizziness.  She took a half tablet the next 2 nights and had nausea and dizziness along with being nervous. She is not sure what and how to take this medicine. [facility] at [facility] notified. I spoke with Bonnita Nasuti, RN and conference her in with the patient. The patient is also asking about getting a referral to [facility] for a c/o spasms in her neck and back.  Reason for Disposition . Caller has URGENT medication question about med that PCP prescribed and triager unable to answer question  Answer Assessment - Initial Assessment Questions 1. SYMPTOMS: "Do you have any symptoms?"    Nervousness, headache, nauseated 2. SEVERITY: If symptoms are present, ask "Are they mild, moderate or severe?"     severe  Protocols used: MEDICATION QUESTION CALL-A-AH

## 2017-11-07 NOTE — Telephone Encounter (Signed)
Do not take the lexapro I don't like the unilateral headache, nausea, and dizziness; I would not expect that from lexapro Please advise her to go to urgent care to get checked out now Please do put in the referral to [facility] for neck and back spasms

## 2017-11-07 NOTE — Addendum Note (Signed)
Addended by: Vercie Pokorny, Satira Anis on: 11/07/2017 04:25 PM   Modules accepted: Orders

## 2017-11-07 NOTE — Telephone Encounter (Signed)
Spoke to patient about medication. She stated that she woke up with bad headache for 1st day. The next day she was nausea, no vomiting, dizziness, bad headache on left side. She stated she spoke to pharmacy and she tried to take a 1/2 tablet instead, but still dizzy with headache. Please advise

## 2017-11-07 NOTE — Telephone Encounter (Signed)
Copied from [place] [id number]. Topic: Quick Communication - See Telephone Encounter >> Nov 07, 2017  9:57 AM Boyd Kerbs wrote: CRM for notification.   [facility] called said they could not make appt until xrays were done (had done on Tuesday at [place] [facility])  She called back and was put in message center and getting run around.  Asking for referral at [facility] or help getting into [facility].  See Telephone encounter for: 11/07/17.

## 2017-11-28 DIAGNOSIS — M7551 Bursitis of right shoulder: Secondary | ICD-10-CM | POA: Diagnosis not present

## 2017-11-28 DIAGNOSIS — M7061 Trochanteric bursitis, right hip: Secondary | ICD-10-CM | POA: Diagnosis not present

## 2017-11-28 DIAGNOSIS — M542 Cervicalgia: Secondary | ICD-10-CM | POA: Diagnosis not present

## 2017-11-28 DIAGNOSIS — M5412 Radiculopathy, cervical region: Secondary | ICD-10-CM | POA: Diagnosis not present

## 2017-12-03 ENCOUNTER — Other Ambulatory Visit: Payer: Self-pay | Admitting: Rehabilitation

## 2017-12-03 DIAGNOSIS — M542 Cervicalgia: Secondary | ICD-10-CM

## 2017-12-10 ENCOUNTER — Ambulatory Visit
Admission: RE | Admit: 2017-12-10 | Discharge: 2017-12-10 | Disposition: A | Discharge status: Home / Self Care | Payer: 59 | Source: Ambulatory Visit | Attending: Rehabilitation | Admitting: Rehabilitation

## 2017-12-10 DIAGNOSIS — M4802 Spinal stenosis, cervical region: Secondary | ICD-10-CM | POA: Diagnosis not present

## 2017-12-10 DIAGNOSIS — M542 Cervicalgia: Secondary | ICD-10-CM

## 2017-12-13 ENCOUNTER — Ambulatory Visit: Payer: 59 | Admitting: Family Medicine

## 2017-12-13 ENCOUNTER — Encounter: Payer: Self-pay | Admitting: Family Medicine

## 2017-12-13 VITALS — BP 118/72 | HR 66 | Temp 98.4°F | Resp 12 | Ht 63.0 in | Wt 147.5 lb

## 2017-12-13 DIAGNOSIS — F419 Anxiety disorder, unspecified: Secondary | ICD-10-CM | POA: Insufficient documentation

## 2017-12-13 DIAGNOSIS — M542 Cervicalgia: Secondary | ICD-10-CM

## 2017-12-13 DIAGNOSIS — R946 Abnormal results of thyroid function studies: Secondary | ICD-10-CM

## 2017-12-13 DIAGNOSIS — G8929 Other chronic pain: Secondary | ICD-10-CM

## 2017-12-13 DIAGNOSIS — E663 Overweight: Secondary | ICD-10-CM | POA: Diagnosis not present

## 2017-12-13 MED ORDER — DIAZEPAM 2 MG PO TABS: 0.2500 mg | ORAL_TABLET | Freq: Two times a day (BID) | ORAL | 0 refills | Status: DC | PRN

## 2017-12-13 MED ORDER — GABAPENTIN 100 MG PO CAPS: ORAL_CAPSULE | ORAL | Status: DC

## 2017-12-13 NOTE — Patient Instructions (Addendum)
12 Ways to Curb Anxiety  ?Anxiety is normal human sensation. It is what helped our ancestors survive the pitfalls of the wilderness. Anxiety is defined as experiencing worry or nervousness about an imminent event or something with an uncertain outcome. It is a feeling experienced by most people at some point in their lives. Anxiety can be triggered by a very personal issue, such as the illness of a loved one, or an event of global proportions, such as a refugee crisis. Some of the symptoms of anxiety are:  Feeling restless.  Having a feeling of impending danger.  Increased heart rate.  Rapid breathing. Sweating.  Shaking.  Weakness or feeling tired.  Difficulty concentrating on anything except the current worry.  Insomnia.  Stomach or bowel problems. What can we do about anxiety we may be feeling? There are many techniques to help manage stress and relax. Here are 12 ways you can reduce your anxiety almost immediately: 1. Turn off the constant feed of information. Take a social media sabbatical. Studies have shown that social media directly contributes to social anxiety.  2. Monitor your television viewing habits. Are you watching shows that are also contributing to your anxiety, such as 24-hour news stations? Try watching something else, or better yet, nothing at all. Instead, listen to music, read an inspirational book or practice a hobby. 3. Eat nutritious meals. Also, don't skip meals and keep healthful snacks on hand. Hunger and poor diet contributes to feeling anxious. 4. Sleep. Sleeping on a regular schedule for at least seven to eight hours a night will do wonders for your outlook when you are awake. 5. Exercise. Regular exercise will help rid your body of that anxious energy and help you get more restful sleep. 6. Try deep (diaphragmatic) breathing. Inhale slowly through your nose for five seconds and exhale through your mouth. 7. Practice acceptance and gratitude. When anxiety hits,  accept that there are things out of your control that shouldn't be of immediate concern.  8. Seek out humor. When anxiety strikes, watch a funny video, read jokes or call a friend who makes you laugh. Laughter is healing for our bodies and releases endorphins that are calming. 9. Stay positive. Take the effort to replace negative thoughts with positive ones. Try to see a stressful situation in a positive light. Try to come up with solutions rather than dwelling on the problem. 10. Figure out what triggers your anxiety. Keep a journal and make note of anxious moments and the events surrounding them. This will help you identify triggers you can avoid or even eliminate. 11. Talk to someone. Let a trusted friend, family member or even trained professional know that you are feeling overwhelmed and anxious. Verbalize what you are feeling and why.  12. Volunteer. If your anxiety is triggered by a crisis on a large scale, become an advocate and work to resolve the problem that is causing you unease. Anxiety is often unwelcome and can become overwhelming. If not kept in check, it can become a disorder that could require medical treatment. However, if you take the time to care for yourself and avoid the triggers that make you anxious, you will be able to find moments of relaxation and clarity that make your life much more enjoyable.  Gabapentin titration schedule Friday, Saturday, Sunday nights: one pill Monday, Tuesday, Wednesday nights: two pills Thursday night and onward: three pills When it's time to stop, taper off the same way (reduce by 100 mg every 3 days)  Call me in one month with an update

## 2017-12-13 NOTE — Assessment & Plan Note (Signed)
Hx of abnormal thyroid tests; will check today

## 2017-12-13 NOTE — Assessment & Plan Note (Signed)
Avoid lexapro; explained that the valium can be addictive, it does help; last diazepam was 11/28/17; no worries of abuse; okay for additional Rx if desired short-term

## 2017-12-13 NOTE — Progress Notes (Signed)
BP 118/72   Pulse 66   Temp 98.4 F (36.9 C) (Oral)   Resp 12   Ht 5\' 3"  (1.6 m)   Wt 147 lb 8 oz (66.9 kg)   SpO2 99%   BMI 26.13 kg/m    Subjective:    Patient ID: Whitney Cooper, female    DOB: 07-28-1960, 57 y.o.   MRN: [id number]  HPI: Whitney Cooper is a 57 y.o. female  Chief Complaint  Patient presents with  . Follow-up  . Anxiety    hydrolyzine did not work had side effect  . Dizziness  . Neck Pain    wants to discuss gabapentnin    HPI  Patient is here for f/u Since her last visit, the went to urgent care and was seen for anxiety; she felt very jittery, she tried lexapro and has side effects; they gave her 15 valium for anxiety and vertigo; took 1/4 to 1/2 pill at a time; not seeing a counselor yet; "I haven't done that part yet"; she is reading a book by Nelda Bucks, [place]; her pastor's daughter is going to help her look through the list and find somebody  She has anxiety; hydroxyzine did work; she denied having any side effects from the hydroxyzine (note above should have said escitalopram did not work and caused side effects) She is trying to destress without taking anything  For her neck, she called a few times across from the hospital, but they didn't call her back; did get a lady on the phone, doctor comes from [place] once a month; had been to Dr. Basil Dess and she went to him; she had an MRI done Tuesday in [place] at [facility]; he thinks she has a pinched nerve in her neck; he put her on gabapentin and hasn't taken it yet; she read about it and is scared to take it; just "terrified" to take it she says; she is going to start it tonight; it may help with anxiety; he is starting   Dizziness; was very dizzy this week and did take a meclizine this week; did help the dizziness  Neck pain; see above  She lost 20 pounds, had bad diverticulitis; ate chicken soup for 3 weeks; eating cream of wheat for breakfast; chicken salad for lunch, lots  of greens; sleeping better; mind is just going; going to be at 11 and getting up at 4:30 to 5:30, waking up on her own now; no watery loose stools or abdominal pain now   Depression screen [facility] 2/9 12/13/2017 11/04/2017 10/17/2017 08/16/2017 06/20/2017  Decreased Interest 0 2 0 0 0  Down, Depressed, Hopeless 0 1 0 0 0  PHQ - 2 Score 0 3 0 0 0  Altered sleeping - 0 - - -  Tired, decreased energy - 3 - - -  Change in appetite - 0 - - -  Feeling bad or failure about yourself  - 0 - - -  Trouble concentrating - 0 - - -  Moving slowly or fidgety/restless - 0 - - -  Suicidal thoughts - 0 - - -  PHQ-9 Score - 6 - - -  Difficult doing work/chores - Not difficult at all - - -    Relevant past medical, surgical, family and social history reviewed Past Medical History:  Diagnosis Date  . Beat, premature ventricular 06/21/2015  . Bundle branch block   . Diverticulitis of colon 07/07/2009  . Dry eyes   . Essential (primary)  hypertension 06/21/2015  . Fibromyalgia   . GERD (gastroesophageal reflux disease)   . History of kidney stones   . Hyperlipidemia   . Hypertension   . IFG (impaired fasting glucose) 01/24/2015  . OAB (overactive bladder)   . Osteoarthritis   . Osteopenia determined by x-ray 02/10/2016  . Recurrent UTI   . Vertigo    Past Surgical History:  Procedure Laterality Date  . COLONOSCOPY WITH PROPOFOL N/A 11/11/2015   Procedure: COLONOSCOPY WITH PROPOFOL;  Surgeon: Lollie Sails, MD;  Location: [facility] ENDOSCOPY;  Service: Endoscopy;  Laterality: N/A;  . TOTAL ABDOMINAL HYSTERECTOMY  Jan 2012   Complete, due to heavy bleeding and grape-fruit size fibroid    Family History  Problem Relation Age of Onset  . Hypertension Mother   . Cancer Mother        skin  . Heart disease Father        CHF  . Hypertension Father   . Stroke Father        mini strokes  . Cancer Father        skin  . Heart attack Father   . Thyroid disease Father   . Ovarian cancer Maternal Grandmother  66  . Cancer Maternal Grandmother        ovarian/brain  . Heart attack Maternal Grandfather   . Heart attack Paternal Grandfather   . Diabetes Maternal Uncle   . Thyroid disease Sister   . Kidney disease Paternal Grandmother   . Breast cancer Neg Hx    Social History   Tobacco Use  . Smoking status: Never Smoker  . Smokeless tobacco: Never Used  Substance Use Topics  . Alcohol use: No  . Drug use: No    Interim medical history since last visit reviewed. Allergies and medications reviewed  Review of Systems Per HPI unless specifically indicated above     Objective:    BP 118/72   Pulse 66   Temp 98.4 F (36.9 C) (Oral)   Resp 12   Ht 5\' 3"  (1.6 m)   Wt 147 lb 8 oz (66.9 kg)   SpO2 99%   BMI 26.13 kg/m   Wt Readings from Last 3 Encounters:  12/13/17 147 lb 8 oz (66.9 kg)  11/04/17 147 lb 8 oz (66.9 kg)  10/27/17 146 lb (66.2 kg)    Physical Exam  Constitutional: She appears well-developed and well-nourished. No distress.  HENT:  Head: Normocephalic and atraumatic.  Eyes: EOM are normal. No scleral icterus.  Neck: No thyromegaly present.  Cardiovascular: Normal rate, regular rhythm and normal heart sounds.  No murmur heard. Pulmonary/Chest: Effort normal and breath sounds normal. No respiratory distress. She has no wheezes.  Abdominal: Soft. Bowel sounds are normal. She exhibits no distension.  Musculoskeletal: Normal range of motion. She exhibits no edema.  Neurological: She is alert. She exhibits normal muscle tone.  Skin: Skin is warm and dry. She is not diaphoretic. No pallor.  Psychiatric: She has a normal mood and affect. Her behavior is normal. Judgment and thought content normal.       Assessment & Plan:   Problem List Items Addressed This Visit      Other   Anxiety - Primary    Avoid lexapro; explained that the valium can be addictive, it does help; last diazepam was 11/28/17; no worries of abuse; okay for additional Rx if desired short-term       Relevant Medications   diazepam (VALIUM) 2 MG tablet  Other Relevant Orders   T3, free   TSH + free T4   Abnormal thyroid function test    Hx of abnormal thyroid tests; will check today      Overweight (BMI 25.0-29.9)    Weight stable after some loss      Neck pain, chronic    Being evaluated by specialist      Relevant Medications   gabapentin (NEURONTIN) 100 MG capsule      Follow up plan: No follow-ups on file.  An after-visit summary was printed and given to the patient at [facility].  Please see the patient instructions which may contain other information and recommendations beyond what is mentioned above in the assessment and plan.  Meds ordered this encounter  Medications  . gabapentin (NEURONTIN) 100 MG capsule    Sig: Taper up by 100 mg every 3 nights to a total of 300 mg every night  . diazepam (VALIUM) 2 MG tablet    Sig: Take 0.5 tablets (1 mg total) by mouth every 12 (twelve) hours as needed for anxiety.    Dispense:  15 tablet    Refill:  0    Orders Placed This Encounter  Procedures  . T3, free  . TSH + free T4

## 2017-12-18 NOTE — Assessment & Plan Note (Signed)
Weight stable after some loss

## 2017-12-18 NOTE — Assessment & Plan Note (Signed)
Being evaluated by specialist

## 2017-12-19 DIAGNOSIS — I10 Essential (primary) hypertension: Secondary | ICD-10-CM | POA: Diagnosis not present

## 2017-12-19 DIAGNOSIS — M5412 Radiculopathy, cervical region: Secondary | ICD-10-CM | POA: Diagnosis not present

## 2017-12-21 LAB — TSH+FREE T4
Free T4: 1.15 ng/dL (ref 0.82–1.77)
TSH: 3.74 u[IU]/mL (ref 0.450–4.500)

## 2017-12-21 LAB — T3, FREE: T3 FREE: 2.9 pg/mL (ref 2.0–4.4)

## 2018-01-02 DIAGNOSIS — L538 Other specified erythematous conditions: Secondary | ICD-10-CM | POA: Diagnosis not present

## 2018-01-02 DIAGNOSIS — L298 Other pruritus: Secondary | ICD-10-CM | POA: Diagnosis not present

## 2018-01-02 DIAGNOSIS — L820 Inflamed seborrheic keratosis: Secondary | ICD-10-CM | POA: Diagnosis not present

## 2018-01-20 DIAGNOSIS — M542 Cervicalgia: Secondary | ICD-10-CM | POA: Diagnosis not present

## 2018-01-20 DIAGNOSIS — M5412 Radiculopathy, cervical region: Secondary | ICD-10-CM | POA: Diagnosis not present

## 2018-01-20 DIAGNOSIS — M791 Myalgia, unspecified site: Secondary | ICD-10-CM | POA: Diagnosis not present

## 2018-02-12 DIAGNOSIS — Z0000 Encounter for general adult medical examination without abnormal findings: Secondary | ICD-10-CM | POA: Insufficient documentation

## 2018-02-12 NOTE — Assessment & Plan Note (Signed)
USPSTF grade A and B recommendations reviewed with patient; age-appropriate recommendations, preventive care, screening tests, etc discussed and encouraged; healthy living encouraged; see AVS for patient education given to patient  

## 2018-02-13 ENCOUNTER — Encounter: Payer: Self-pay | Admitting: Family Medicine

## 2018-02-13 ENCOUNTER — Ambulatory Visit (INDEPENDENT_AMBULATORY_CARE_PROVIDER_SITE_OTHER): Payer: 59 | Admitting: Family Medicine

## 2018-02-13 VITALS — BP 112/64 | HR 80 | Temp 98.3°F | Resp 12 | Ht 63.0 in | Wt 144.4 lb

## 2018-02-13 DIAGNOSIS — Z1239 Encounter for other screening for malignant neoplasm of breast: Secondary | ICD-10-CM

## 2018-02-13 DIAGNOSIS — Z0000 Encounter for general adult medical examination without abnormal findings: Secondary | ICD-10-CM

## 2018-02-13 DIAGNOSIS — Z1231 Encounter for screening mammogram for malignant neoplasm of breast: Secondary | ICD-10-CM

## 2018-02-13 NOTE — Patient Instructions (Addendum)
Sleep Hygiene Tips 1) Get regular. One of the best ways to train your body to sleep well is to go to bed and get up at more or less the same time every day, even on weekends and days off! This regular rhythm will make you feel better and will give your body something to work from. 2) Sleep when sleepy. Only try to sleep when you actually feel tired or sleepy, rather than spending too much time awake in bed. 3) Get up & try again. If you haven't been able to get to sleep after about 20 minutes or more, get up and do something calming or boring until you feel sleepy, then return to bed and try again. Sit quietly on the couch with the lights off (bright light will tell your brain that it is time to wake up), or read something boring like the phone book. Avoid doing anything that is too stimulating or interesting, as this will wake you up even more. 4) Avoid caffeine & nicotine. It is best to avoid consuming any caffeine (in coffee, tea, cola drinks, chocolate, and some medications) or nicotine (cigarettes) for at least 4-6 hours before going to bed. These substances act as stimulants and interfere with the ability to fall asleep 5) Avoid alcohol. It is also best to avoid alcohol for at least 4-6 hours before going to bed. Many people believe that alcohol is relaxing and helps them to get to sleep at first, but it actually interrupts the quality of sleep. 6) Bed is for sleeping. Try not to use your bed for anything other than sleeping and sex, so that your body comes to associate bed with sleep. If you use bed as a place to watch TV, eat, read, work on your laptop, pay bills, and other things, your body will not learn this Connection. 7) No naps. It is best to avoid taking naps during the day, to make sure that you are tired at bedtime. If you can't make it through the day without a nap, make sure it is for less than an hour and before 3pm. 8) Sleep rituals. You can develop your  own rituals of things to remind your body that it is time to sleep - some people find it useful to do relaxing stretches or breathing exercises for 15 minutes before bed each night, or sit calmly with a cup of caffeine-free tea. 9) Bathtime. Having a hot bath 1-2 hours before bedtime can be useful, as it will raise your body temperature, causing you to feel sleepy as your body temperature drops again. Research shows that sleepiness is associated with a drop in body temperature. 10) No clock-watching. Many people who struggle with sleep tend to watch the clock too much. Frequently checking the clock during the night can wake you up (especially if you turn on the light to read the time) and reinforces negative thoughts such as "Oh no, look how late it is, I'll never get to sleep" or "it's so early, I have only slept for 5 hours, this is terrible." 11) Use a sleep diary. This worksheet can be a useful way of making sure you have the right facts about your sleep, rather than making assumptions. Because a diary involves watching the clock (see point 10) it is a good idea to only use it for two weeks to get an idea of what is going and then perhaps two months down the track to see how you are progressing. 12) Exercise. Regular exercise is  a good idea to help with good sleep, but try not to do strenuous exercise in the 4 hours before bedtime. Morning walks are a great way to start the day feeling refreshed! 13) Eat right. A healthy, balanced diet will help you to sleep well, but timing is important. Some people find that a very empty stomach at bedtime is distracting, so it can be useful to have a light snack, but a heavy meal soon before bed can also interrupt sleep. Some people recommend a warm glass of milk, which contains tryptophan, which acts as a natural sleep inducer. 14) The right space. It is very important that your bed and bedroom are quiet and comfortable for sleeping. A  cooler room with enough blankets to stay warm is best, and make sure you have curtains or an eyemask to block out early morning light and earplugs if there is noise outside your room. 15) Keep daytime routine the same. Even if you have a bad night sleep and are tired it is important that you try to keep your daytime activities the same as you had planned. That is, don't avoid activities because you feel tired. This can reinforce the insomnia.  Consider getting the new shingles vaccine called Shingrix; that is available for individuals 59 years of age and older, and is recommended even if you have had shingles in the past and/or already received the old shingles vaccine (Zostavax); it is a two-part series, and is available at many local pharmacies  Health Maintenance, Female Adopting a healthy lifestyle and getting preventive care can go a long way to promote health and wellness. Talk with your health care provider about what schedule of regular examinations is right for you. This is a good chance for you to check in with your provider about disease prevention and staying healthy. In between checkups, there are plenty of things you can do on your own. Experts have done a lot of research about which lifestyle changes and preventive measures are most likely to keep you healthy. Ask your health care provider for more information. Weight and diet Eat a healthy diet  Be sure to include plenty of vegetables, fruits, low-fat dairy products, and lean protein.  Do not eat a lot of foods high in solid fats, added sugars, or salt.  Get regular exercise. This is one of the most important things you can do for your health. ? Most adults should exercise for at least 150 minutes each week. The exercise should increase your heart rate and make you sweat (moderate-intensity exercise). ? Most adults should also do strengthening exercises at least twice a week. This is in addition to the moderate-intensity  exercise.  Maintain a healthy weight  Body mass index (BMI) is a measurement that can be used to identify possible weight problems. It estimates body fat based on height and weight. Your health care provider can help determine your BMI and help you achieve or maintain a healthy weight.  For females 32 years of age and older: ? A BMI below 18.5 is considered underweight. ? A BMI of 18.5 to 24.9 is normal. ? A BMI of 25 to 29.9 is considered overweight. ? A BMI of 30 and above is considered obese.  Watch levels of cholesterol and blood lipids  You should start having your blood tested for lipids and cholesterol at 59 years of age, then have this test every 5 years.  You may need to have your cholesterol levels checked more often if: ?  Your lipid or cholesterol levels are high. ? You are older than 59 years of age. ? You are at high risk for heart disease.  Cancer screening Lung Cancer  Lung cancer screening is recommended for adults 25-37 years old who are at high risk for lung cancer because of a history of smoking.  A yearly low-dose CT scan of the lungs is recommended for people who: ? Currently smoke. ? Have quit within the past 15 years. ? Have at least a 30-pack-year history of smoking. A pack year is smoking an average of one pack of cigarettes a day for 1 year.  Yearly screening should continue until it has been 15 years since you quit.  Yearly screening should stop if you develop a health problem that would prevent you from having lung cancer treatment.  Breast Cancer  Practice breast self-awareness. This means understanding how your breasts normally appear and feel.  It also means doing regular breast self-exams. Let your health care provider know about any changes, no matter how small.  If you are in your 20s or 30s, you should have a clinical breast exam (CBE) by a health care provider every 1-3 years as part of a regular health exam.  If you are 64 or older, have  a CBE every year. Also consider having a breast X-ray (mammogram) every year.  If you have a family history of breast cancer, talk to your health care provider about genetic screening.  If you are at high risk for breast cancer, talk to your health care provider about having an MRI and a mammogram every year.  Breast cancer gene (BRCA) assessment is recommended for women who have family members with BRCA-related cancers. BRCA-related cancers include: ? Breast. ? Ovarian. ? Tubal. ? Peritoneal cancers.  Results of the assessment will determine the need for genetic counseling and BRCA1 and BRCA2 testing.  Cervical Cancer Your health care provider may recommend that you be screened regularly for cancer of the pelvic organs (ovaries, uterus, and vagina). This screening involves a pelvic examination, including checking for microscopic changes to the surface of your cervix (Pap test). You may be encouraged to have this screening done every 3 years, beginning at age 58.  For women ages 28-65, health care providers may recommend pelvic exams and Pap testing every 3 years, or they may recommend the Pap and pelvic exam, combined with testing for human papilloma virus (HPV), every 5 years. Some types of HPV increase your risk of cervical cancer. Testing for HPV may also be done on women of any age with unclear Pap test results.  Other health care providers may not recommend any screening for nonpregnant women who are considered low risk for pelvic cancer and who do not have symptoms. Ask your health care provider if a screening pelvic exam is right for you.  If you have had past treatment for cervical cancer or a condition that could lead to cancer, you need Pap tests and screening for cancer for at least 20 years after your treatment. If Pap tests have been discontinued, your risk factors (such as having a new sexual partner) need to be reassessed to determine if screening should resume. Some women have  medical problems that increase the chance of getting cervical cancer. In these cases, your health care provider may recommend more frequent screening and Pap tests.  Colorectal Cancer  This type of cancer can be detected and often prevented.  Routine colorectal cancer screening usually begins at 59 years  of age and continues through 59 years of age.  Your health care provider may recommend screening at an earlier age if you have risk factors for colon cancer.  Your health care provider may also recommend using home test kits to check for hidden blood in the stool.  A small camera at the end of a tube can be used to examine your colon directly (sigmoidoscopy or colonoscopy). This is done to check for the earliest forms of colorectal cancer.  Routine screening usually begins at age 46.  Direct examination of the colon should be repeated every 5-10 years through 59 years of age. However, you may need to be screened more often if early forms of precancerous polyps or small growths are found.  Skin Cancer  Check your skin from head to toe regularly.  Tell your health care provider about any new moles or changes in moles, especially if there is a change in a mole's shape or color.  Also tell your health care provider if you have a mole that is larger than the size of a pencil eraser.  Always use sunscreen. Apply sunscreen liberally and repeatedly throughout the day.  Protect yourself by wearing long sleeves, pants, a wide-brimmed hat, and sunglasses whenever you are outside.  Heart disease, diabetes, and high blood pressure  High blood pressure causes heart disease and increases the risk of stroke. High blood pressure is more likely to develop in: ? People who have blood pressure in the high end of the normal range (130-139/85-89 mm Hg). ? People who are overweight or obese. ? People who are African American.  If you are 39-59 years of age, have your blood pressure checked every 3-5  years. If you are 31 years of age or older, have your blood pressure checked every year. You should have your blood pressure measured twice-once when you are at a hospital or clinic, and once when you are not at a hospital or clinic. Record the average of the two measurements. To check your blood pressure when you are not at a hospital or clinic, you can use: ? An automated blood pressure machine at a pharmacy. ? A home blood pressure monitor.  If you are between 66 years and 15 years old, ask your health care provider if you should take aspirin to prevent strokes.  Have regular diabetes screenings. This involves taking a blood sample to check your fasting blood sugar level. ? If you are at a normal weight and have a low risk for diabetes, have this test once every three years after 59 years of age. ? If you are overweight and have a high risk for diabetes, consider being tested at a younger age or more often. Preventing infection Hepatitis B  If you have a higher risk for hepatitis B, you should be screened for this virus. You are considered at high risk for hepatitis B if: ? You were born in a country where hepatitis B is common. Ask your health care provider which countries are considered high risk. ? Your parents were born in a high-risk country, and you have not been immunized against hepatitis B (hepatitis B vaccine). ? You have HIV or AIDS. ? You use needles to inject street drugs. ? You live with someone who has hepatitis B. ? You have had sex with someone who has hepatitis B. ? You get hemodialysis treatment. ? You take certain medicines for conditions, including cancer, organ transplantation, and autoimmune conditions.  Hepatitis C  Blood testing  is recommended for: ? Everyone born from [birth year] through 1967. ? Anyone with known risk factors for hepatitis C.  Sexually transmitted infections (STIs)  You should be screened for sexually transmitted infections (STIs) including  gonorrhea and chlamydia if: ? You are sexually active and are younger than 59 years of age. ? You are older than 59 years of age and your health care provider tells you that you are at risk for this type of infection. ? Your sexual activity has changed since you were last screened and you are at an increased risk for chlamydia or gonorrhea. Ask your health care provider if you are at risk.  If you do not have HIV, but are at risk, it may be recommended that you take a prescription medicine daily to prevent HIV infection. This is called pre-exposure prophylaxis (PrEP). You are considered at risk if: ? You are sexually active and do not regularly use condoms or know the HIV status of your partner(s). ? You take drugs by injection. ? You are sexually active with a partner who has HIV.  Talk with your health care provider about whether you are at high risk of being infected with HIV. If you choose to begin PrEP, you should first be tested for HIV. You should then be tested every 3 months for as long as you are taking PrEP. Pregnancy  If you are premenopausal and you may become pregnant, ask your health care provider about preconception counseling.  If you may become pregnant, take 400 to 800 micrograms (mcg) of folic acid every day.  If you want to prevent pregnancy, talk to your health care provider about birth control (contraception). Osteoporosis and menopause  Osteoporosis is a disease in which the bones lose minerals and strength with aging. This can result in serious bone fractures. Your risk for osteoporosis can be identified using a bone density scan.  If you are 47 years of age or older, or if you are at risk for osteoporosis and fractures, ask your health care provider if you should be screened.  Ask your health care provider whether you should take a calcium or vitamin D supplement to lower your risk for osteoporosis.  Menopause may have certain physical symptoms and  risks.  Hormone replacement therapy may reduce some of these symptoms and risks. Talk to your health care provider about whether hormone replacement therapy is right for you. Follow these instructions at home:  Schedule regular health, dental, and eye exams.  Stay current with your immunizations.  Do not use any tobacco products including cigarettes, chewing tobacco, or electronic cigarettes.  If you are pregnant, do not drink alcohol.  If you are breastfeeding, limit how much and how often you drink alcohol.  Limit alcohol intake to no more than 1 drink per day for nonpregnant women. One drink equals 12 ounces of beer, 5 ounces of wine, or 1 ounces of hard liquor.  Do not use street drugs.  Do not share needles.  Ask your health care provider for help if you need support or information about quitting drugs.  Tell your health care provider if you often feel depressed.  Tell your health care provider if you have ever been abused or do not feel safe at home. This information is not intended to replace advice given to you by your health care provider. Make sure you discuss any questions you have with your health care provider. Document Released: 01/22/2011 Document Revised: 12/15/2015 Document Reviewed: 04/12/2015 [employer] Interactive Patient Education  2018 [place].

## 2018-02-13 NOTE — Progress Notes (Signed)
Patient ID: Whitney Cooper, female   DOB: 1961/02/17, 57 y.o.   MRN: [id number]   Subjective:   Whitney Cooper is a 57 y.o. female here for a complete physical exam  Interim issues since last visit: going to doctor for shoulder and neck; had some shots; gabapentin, not working, got up to 300 mg, will wean off; does help her sleep; shots did not work either; goes back in Sept  USPSTF grade A and B recommendations Depression:  Depression screen [facility] 2/9 02/13/2018 12/13/2017 11/04/2017 10/17/2017 08/16/2017  Decreased Interest 0 0 2 0 0  Down, Depressed, Hopeless 0 0 1 0 0  PHQ - 2 Score 0 0 3 0 0  Altered sleeping 0 - 0 - -  Tired, decreased energy 0 - 3 - -  Change in appetite 0 - 0 - -  Feeling bad or failure about yourself  0 - 0 - -  Trouble concentrating 0 - 0 - -  Moving slowly or fidgety/restless 0 - 0 - -  Suicidal thoughts 0 - 0 - -  PHQ-9 Score 0 - 6 - -  Difficult doing work/chores Not difficult at all - Not difficult at all - -   Hypertension: BP Readings from Last 3 Encounters:  02/13/18 112/64  12/13/17 118/72  11/04/17 130/68   Obesity: Wt Readings from Last 3 Encounters:  02/13/18 144 lb 6.4 oz (65.5 kg)  12/13/17 147 lb 8 oz (66.9 kg)  11/04/17 147 lb 8 oz (66.9 kg)   BMI Readings from Last 3 Encounters:  02/13/18 25.58 kg/m  12/13/17 26.13 kg/m  11/04/17 25.92 kg/m    Skin cancer: dad had skin cancer, goes to dermatologist yearly, wears sunscreen on her face every day, and on her body when she's at the beach Lung cancer:  Never smoked Breast cancer: no personal or family history, does self breast checks. Gets annual mammograms Colorectal cancer: Has precancerous polpys and gets colonoscopy every 5 years. Last one was 2017 Cervical cancer screening: hysterectomy 2012 BRCA gene screening: family hx of breast and/or ovarian cancer and/or pancreatic or metastatic prostate cancer? Maternal grandmother had ovarian cancer; they think it started ovarian, mets to  brain; only fam member HIV, hep B, hep C: declines  STD testing and prevention (chl/gon/syphilis): declines  Intimate partner violence: no abuse Contraception: hysterectomy  Osteoporosis: DEXA scan completed on 2017  Fall prevention/vitamin D: discussed- Taking Vitamin D daily  Immunizations: new shingrix discussed Diet: mostly vegetables and chicken, rarely eats read meat. Eats cereal or cream of wheat with almond milk in the mornings, lots of fruit and chicken salad.  Drinks- water only about 4 16 ounces of bottles Exercise: walks and has exercise bike, 3 days for about 40 minutes.  Alcohol: rarely- for special event. States wine bothers reflux Tobacco use: denies Aspirin: The 10-year ASCVD risk score Mikey Bussing DC Jr., et al., 2013) is: 2.6%   Values used to calculate the score:     Age: 66 years     Sex: Female     Is Non-Hispanic African American: No     Diabetic: No     Tobacco smoker: No     Systolic Blood Pressure: 751 mmHg     Is BP treated: Yes     HDL Cholesterol: 45 mg/dL     Total Cholesterol: 135 mg/dL Low risk.   Glucose:  Glucose  Date Value Ref Range Status  08/16/2017 107 (H) 65 - 99 mg/dL Final  02/12/2017 106 (H)  65 - 99 mg/dL Final  12/30/2013 101 (H) 65 - 99 mg/dL Final   Glucose, Bld  Date Value Ref Range Status  10/27/2017 119 (H) 65 - 99 mg/dL Final  06/19/2017 104 (H) 65 - 99 mg/dL Final  07/20/2016 103 (H) 65 - 99 mg/dL Final   Lipids:  Lab Results  Component Value Date   CHOL 135 08/16/2017   CHOL 179 02/12/2017   CHOL 193 11/08/2016   Lab Results  Component Value Date   HDL 45 08/16/2017   HDL 44 02/12/2017   HDL 43 11/08/2016   Lab Results  Component Value Date   LDLCALC 69 08/16/2017   LDLCALC 98 02/12/2017   LDLCALC 111 (H) 11/08/2016   Lab Results  Component Value Date   TRIG 103 08/16/2017   TRIG 184 (H) 02/12/2017   TRIG 193 (H) 11/08/2016   Lab Results  Component Value Date   CHOLHDL 3.0 08/16/2017   CHOLHDL 4.1  02/12/2017   CHOLHDL 4.9 (H) 10/05/2016   No results found for: LDLDIRECT   Past Medical History:  Diagnosis Date  . Beat, premature ventricular 06/21/2015  . Bundle branch block   . Diverticulitis of colon 07/07/2009  . Dry eyes   . Essential (primary) hypertension 06/21/2015  . Fibromyalgia   . GERD (gastroesophageal reflux disease)   . History of kidney stones   . Hyperlipidemia   . Hypertension   . IFG (impaired fasting glucose) 01/24/2015  . OAB (overactive bladder)   . Osteoarthritis   . Osteopenia determined by x-ray 02/10/2016  . Recurrent UTI   . Vertigo    Past Surgical History:  Procedure Laterality Date  . COLONOSCOPY WITH PROPOFOL N/A 11/11/2015   Procedure: COLONOSCOPY WITH PROPOFOL;  Surgeon: Lollie Sails, MD;  Location: [facility] ENDOSCOPY;  Service: Endoscopy;  Laterality: N/A;  . TOTAL ABDOMINAL HYSTERECTOMY  Jan 2012   Complete, due to heavy bleeding and grape-fruit size fibroid    Family History  Problem Relation Age of Onset  . Hypertension Mother   . Cancer Mother        skin  . Heart disease Father        CHF  . Hypertension Father   . Stroke Father        mini strokes  . Cancer Father        skin  . Heart attack Father   . Thyroid disease Father   . Ovarian cancer Maternal Grandmother 66  . Cancer Maternal Grandmother        ovarian/brain  . Heart attack Maternal Grandfather   . Heart attack Paternal Grandfather   . Diabetes Maternal Uncle   . Thyroid disease Sister   . Kidney disease Paternal Grandmother   . Breast cancer Neg Hx    Social History   Tobacco Use  . Smoking status: Never Smoker  . Smokeless tobacco: Never Used  Substance Use Topics  . Alcohol use: No  . Drug use: No   Review of Systems  Constitutional: Negative for unexpected weight change.  HENT: Negative for hearing loss.   Eyes: Positive for visual disturbance (does have cataracts; goes to [facility] end of August).  Respiratory: Negative for wheezing.    Cardiovascular: Negative for chest pain.  Gastrointestinal: Negative for blood in stool.  Endocrine: Negative for polydipsia.  Genitourinary: Negative for hematuria.  Musculoskeletal:       Shoulder and neck problems  Skin:       Seeing skin doctor yearly  Allergic/Immunologic: Negative for food allergies.  Neurological: Negative for tremors.  Hematological: Does not bruise/bleed easily.    Objective:   Vitals:   02/13/18 0829  BP: 112/64  Pulse: 80  Resp: 12  Temp: 98.3 F (36.8 C)  TempSrc: Oral  SpO2: 96%  Weight: 144 lb 6.4 oz (65.5 kg)  Height: 5' 3"  (1.6 m)   Body mass index is 25.58 kg/m. Wt Readings from Last 3 Encounters:  02/13/18 144 lb 6.4 oz (65.5 kg)  12/13/17 147 lb 8 oz (66.9 kg)  11/04/17 147 lb 8 oz (66.9 kg)   Physical Exam  Constitutional: She appears well-developed and well-nourished.  HENT:  Head: Normocephalic and atraumatic.  Right Ear: Hearing, tympanic membrane, external ear and ear canal normal.  Left Ear: Hearing, tympanic membrane, external ear and ear canal normal.  Eyes: Conjunctivae and EOM are normal. Right eye exhibits no hordeolum. Left eye exhibits no hordeolum. No scleral icterus.  Neck: Carotid bruit is not present. No thyromegaly present.  Cardiovascular: Normal rate, regular rhythm, S1 normal, S2 normal and normal heart sounds.  No extrasystoles are present.  Pulmonary/Chest: Effort normal and breath sounds normal. No respiratory distress. Right breast exhibits no inverted nipple, no mass, no nipple discharge, no skin change and no tenderness. Left breast exhibits no inverted nipple, no mass, no nipple discharge, no skin change and no tenderness. Breasts are symmetrical.  Abdominal: Soft. Normal appearance and bowel sounds are normal. She exhibits no distension, no abdominal bruit, no pulsatile midline mass and no mass. There is no hepatosplenomegaly. There is no tenderness. No hernia.  Musculoskeletal: Normal range of motion.  She exhibits no edema.  Lymphadenopathy:       Head (right side): No submandibular adenopathy present.       Head (left side): No submandibular adenopathy present.    She has no cervical adenopathy.    She has no axillary adenopathy.  Neurological: She is alert. She displays no tremor. No cranial nerve deficit. She exhibits normal muscle tone. Gait normal.  Reflex Scores:      Patellar reflexes are 2+ on the right side and 2+ on the left side. Skin: Skin is warm and dry. No bruising and no ecchymosis noted. No cyanosis. No pallor.  Psychiatric: Her speech is normal and behavior is normal. Thought content normal. Her mood appears not anxious. She does not exhibit a depressed mood.    Assessment/Plan:   Problem List Items Addressed This Visit      Other   Preventative health care - Primary    USPSTF grade A and B recommendations reviewed with patient; age-appropriate recommendations, preventive care, screening tests, etc discussed and encouraged; healthy living encouraged; see AVS for patient education given to patient       Relevant Orders   CBC with Differential/Platelet   Comprehensive metabolic panel   Lipid panel   TSH    Other Visit Diagnoses    Screening for breast cancer       Relevant Orders   MM 3D SCREEN BREAST BILATERAL       No orders of the defined types were placed in this encounter.  Orders Placed This Encounter  Procedures  . MM 3D SCREEN BREAST BILATERAL    Standing Status:   Future    Standing Expiration Date:   04/17/2019    Order Specific Question:   Reason for Exam (SYMPTOM  OR DIAGNOSIS REQUIRED)    Answer:   screen for breast cancer    Order  Specific Question:   Is the patient pregnant?    Answer:   No    Order Specific Question:   Preferred imaging location?    Answer:   [facility]  . CBC with Differential/Platelet  . Comprehensive metabolic panel  . Lipid panel  . TSH    Follow up plan: Return in about 1 year (around 02/14/2019) for  complete physical.  An After Visit Summary was printed and given to the patient.

## 2018-02-14 LAB — CBC WITH DIFFERENTIAL/PLATELET
Basophils Absolute: 0 10*3/uL (ref 0.0–0.2)
Basos: 0 %
EOS (ABSOLUTE): 0.1 10*3/uL (ref 0.0–0.4)
Eos: 1 %
HEMATOCRIT: 41.7 % (ref 34.0–46.6)
HEMOGLOBIN: 14.2 g/dL (ref 11.1–15.9)
Immature Grans (Abs): 0 10*3/uL (ref 0.0–0.1)
Immature Granulocytes: 0 %
LYMPHS: 28 %
Lymphocytes Absolute: 2.1 10*3/uL (ref 0.7–3.1)
MCH: 29.5 pg (ref 26.6–33.0)
MCHC: 34.1 g/dL (ref 31.5–35.7)
MCV: 87 fL (ref 79–97)
MONOCYTES: 7 %
Monocytes Absolute: 0.5 10*3/uL (ref 0.1–0.9)
NEUTROS ABS: 4.6 10*3/uL (ref 1.4–7.0)
Neutrophils: 64 %
Platelets: 198 10*3/uL (ref 150–450)
RBC: 4.82 x10E6/uL (ref 3.77–5.28)
RDW: 12.9 % (ref 12.3–15.4)
WBC: 7.3 10*3/uL (ref 3.4–10.8)

## 2018-02-14 LAB — COMPREHENSIVE METABOLIC PANEL
A/G RATIO: 2 (ref 1.2–2.2)
ALBUMIN: 4.5 g/dL (ref 3.5–5.5)
ALK PHOS: 50 IU/L (ref 39–117)
ALT: 25 IU/L (ref 0–32)
AST: 19 IU/L (ref 0–40)
BILIRUBIN TOTAL: 0.5 mg/dL (ref 0.0–1.2)
BUN / CREAT RATIO: 12 (ref 9–23)
BUN: 8 mg/dL (ref 6–24)
CO2: 23 mmol/L (ref 20–29)
Calcium: 9.6 mg/dL (ref 8.7–10.2)
Chloride: 106 mmol/L (ref 96–106)
Creatinine, Ser: 0.66 mg/dL (ref 0.57–1.00)
GFR calc Af Amer: 112 mL/min/{1.73_m2} (ref >59)
GFR calc non Af Amer: 97 mL/min/{1.73_m2} (ref >59)
Globulin, Total: 2.3 g/dL (ref 1.5–4.5)
Glucose: 102 mg/dL — ABNORMAL HIGH (ref 65–99)
POTASSIUM: 4.5 mmol/L (ref 3.5–5.2)
Sodium: 143 mmol/L (ref 134–144)
Total Protein: 6.8 g/dL (ref 6.0–8.5)

## 2018-02-14 LAB — LIPID PANEL
CHOLESTEROL TOTAL: 191 mg/dL (ref 100–199)
Chol/HDL Ratio: 3.5 ratio (ref 0.0–4.4)
HDL: 54 mg/dL (ref >39)
LDL Calculated: 114 mg/dL — ABNORMAL HIGH (ref 0–99)
TRIGLYCERIDES: 114 mg/dL (ref 0–149)
VLDL Cholesterol Cal: 23 mg/dL (ref 5–40)

## 2018-02-14 LAB — TSH: TSH: 4.58 u[IU]/mL — AB (ref 0.450–4.500)

## 2018-02-24 ENCOUNTER — Telehealth: Payer: Self-pay | Admitting: Family Medicine

## 2018-02-24 NOTE — Telephone Encounter (Signed)
Copied from [place] [id number]. Topic: Quick Communication - See Telephone Encounter >> Feb 24, 2018  9:50 AM Ahmed Prima L wrote: CRM for notification. See Telephone encounter for: 02/24/18.  Patient requesting labs from 7/25

## 2018-02-25 NOTE — Telephone Encounter (Signed)
Left detailed voicemail

## 2018-02-25 NOTE — Telephone Encounter (Signed)
The 10-year ASCVD risk score Mikey Bussing DC Jr., et al., 2013) is: 3%   Values used to calculate the score:     Age: 57 years     Sex: Female     Is Non-Hispanic African American: No     Diabetic: No     Tobacco smoker: No     Systolic Blood Pressure: 600 mmHg     Is BP treated: Yes     HDL Cholesterol: 54 mg/dL     Total Cholesterol: 191 mg/dL  Please let the patient know that her thyroid is just a little bit outside the normal range; let's recheck that in 6-8 weeks (please ORDER), dx abnormal thyroid test LDL cholesterol is a little above ideal, so really limit saturated fats, try to get more whole grains; continue the statin Glucose just a few points above normal; not a worry if she wasn't fasting; if she was truly fasting, then try to limit sweet and starches and simple carbs Other labs okay

## 2018-03-11 DIAGNOSIS — R1012 Left upper quadrant pain: Secondary | ICD-10-CM | POA: Diagnosis not present

## 2018-03-12 ENCOUNTER — Ambulatory Visit
Admission: RE | Admit: 2018-03-12 | Discharge: 2018-03-12 | Disposition: A | Discharge status: Home / Self Care | Payer: 59 | Source: Ambulatory Visit | Attending: Family Medicine | Admitting: Family Medicine

## 2018-03-12 DIAGNOSIS — Z1231 Encounter for screening mammogram for malignant neoplasm of breast: Secondary | ICD-10-CM | POA: Insufficient documentation

## 2018-03-12 DIAGNOSIS — Z1239 Encounter for other screening for malignant neoplasm of breast: Secondary | ICD-10-CM

## 2018-03-25 DIAGNOSIS — M542 Cervicalgia: Secondary | ICD-10-CM | POA: Diagnosis not present

## 2018-03-25 DIAGNOSIS — M5412 Radiculopathy, cervical region: Secondary | ICD-10-CM | POA: Diagnosis not present

## 2018-03-25 DIAGNOSIS — M791 Myalgia, unspecified site: Secondary | ICD-10-CM | POA: Diagnosis not present

## 2018-03-25 NOTE — Telephone Encounter (Signed)
Pt is calling in wanting a copy of her lab results. She states she can pick up in office today she can be there sometime after lunch today.

## 2018-03-25 NOTE — Telephone Encounter (Signed)
Already done

## 2018-04-14 ENCOUNTER — Telehealth: Payer: Self-pay | Admitting: Family Medicine

## 2018-04-14 ENCOUNTER — Encounter: Payer: Self-pay | Admitting: Nurse Practitioner

## 2018-04-14 ENCOUNTER — Ambulatory Visit
Admission: RE | Admit: 2018-04-14 | Discharge: 2018-04-14 | Disposition: A | Discharge status: Home / Self Care | Payer: 59 | Source: Ambulatory Visit | Attending: Nurse Practitioner | Admitting: Nurse Practitioner

## 2018-04-14 ENCOUNTER — Ambulatory Visit: Payer: Self-pay

## 2018-04-14 ENCOUNTER — Other Ambulatory Visit: Payer: Self-pay | Admitting: Nurse Practitioner

## 2018-04-14 ENCOUNTER — Other Ambulatory Visit
Admission: RE | Admit: 2018-04-14 | Discharge: 2018-04-14 | Disposition: A | Discharge status: Home / Self Care | Payer: 59 | Source: Ambulatory Visit | Attending: Nurse Practitioner | Admitting: Nurse Practitioner

## 2018-04-14 ENCOUNTER — Ambulatory Visit: Payer: 59 | Admitting: Nurse Practitioner

## 2018-04-14 VITALS — BP 104/70 | HR 100 | Temp 98.3°F | Resp 16 | Ht 63.0 in | Wt 145.2 lb

## 2018-04-14 DIAGNOSIS — R102 Pelvic and perineal pain: Secondary | ICD-10-CM | POA: Diagnosis not present

## 2018-04-14 DIAGNOSIS — K5732 Diverticulitis of large intestine without perforation or abscess without bleeding: Secondary | ICD-10-CM

## 2018-04-14 DIAGNOSIS — R1030 Lower abdominal pain, unspecified: Secondary | ICD-10-CM | POA: Diagnosis not present

## 2018-04-14 DIAGNOSIS — R600 Localized edema: Secondary | ICD-10-CM | POA: Insufficient documentation

## 2018-04-14 DIAGNOSIS — R197 Diarrhea, unspecified: Secondary | ICD-10-CM

## 2018-04-14 DIAGNOSIS — R110 Nausea: Secondary | ICD-10-CM

## 2018-04-14 DIAGNOSIS — R300 Dysuria: Secondary | ICD-10-CM | POA: Diagnosis not present

## 2018-04-14 LAB — COMPREHENSIVE METABOLIC PANEL
ALK PHOS: 68 U/L (ref 38–126)
ALT: 31 U/L (ref 0–44)
AST: 25 U/L (ref 15–41)
Albumin: 4.2 g/dL (ref 3.5–5.0)
Anion gap: 6 (ref 5–15)
BUN: 8 mg/dL (ref 6–20)
CALCIUM: 9.1 mg/dL (ref 8.9–10.3)
CHLORIDE: 106 mmol/L (ref 98–111)
CO2: 28 mmol/L (ref 22–32)
CREATININE: 0.72 mg/dL (ref 0.44–1.00)
GFR calc Af Amer: >60 mL/min (ref >60)
GFR calc non Af Amer: >60 mL/min (ref >60)
Glucose, Bld: 91 mg/dL (ref 70–99)
Potassium: 4 mmol/L (ref 3.5–5.1)
Sodium: 140 mmol/L (ref 135–145)
Total Bilirubin: 0.8 mg/dL (ref 0.3–1.2)
Total Protein: 7.3 g/dL (ref 6.5–8.1)

## 2018-04-14 LAB — CBC WITH DIFFERENTIAL/PLATELET
BASOS ABS: 0.1 10*3/uL (ref 0–0.1)
Basophils Relative: 0 %
EOS PCT: 1 %
Eosinophils Absolute: 0.1 10*3/uL (ref 0–0.7)
HCT: 40 % (ref 35.0–47.0)
HEMOGLOBIN: 14 g/dL (ref 12.0–16.0)
LYMPHS ABS: 2 10*3/uL (ref 1.0–3.6)
Lymphocytes Relative: 15 %
MCH: 30.6 pg (ref 26.0–34.0)
MCHC: 34.9 g/dL (ref 32.0–36.0)
MCV: 87.7 fL (ref 80.0–100.0)
Monocytes Absolute: 1.2 10*3/uL — ABNORMAL HIGH (ref 0.2–0.9)
Monocytes Relative: 9 %
NEUTROS ABS: 10.3 10*3/uL — AB (ref 1.4–6.5)
NEUTROS PCT: 75 %
PLATELETS: 235 10*3/uL (ref 150–440)
RBC: 4.56 MIL/uL (ref 3.80–5.20)
RDW: 13.3 % (ref 11.5–14.5)
WBC: 13.6 10*3/uL — AB (ref 3.6–11.0)

## 2018-04-14 LAB — POCT URINALYSIS DIPSTICK
BILIRUBIN UA: NEGATIVE
GLUCOSE UA: NEGATIVE
Ketones, UA: NEGATIVE
Leukocytes, UA: NEGATIVE
Nitrite, UA: NEGATIVE
Odor: NORMAL
Protein, UA: NEGATIVE
RBC UA: NEGATIVE
SPEC GRAV UA: 1.01 (ref 1.010–1.025)
Urobilinogen, UA: 0.2 E.U./dL
pH, UA: 7 (ref 5.0–8.0)

## 2018-04-14 LAB — AMYLASE: Amylase: 40 U/L (ref 28–100)

## 2018-04-14 MED ORDER — CIPROFLOXACIN HCL 500 MG PO TABS: 500.0000 mg | ORAL_TABLET | Freq: Two times a day (BID) | ORAL | 0 refills | Status: DC

## 2018-04-14 MED ORDER — METRONIDAZOLE 500 MG PO TABS: 500.0000 mg | ORAL_TABLET | Freq: Three times a day (TID) | ORAL | 0 refills | Status: DC

## 2018-04-14 MED ORDER — IOPAMIDOL (ISOVUE-300) INJECTION 61%
100.0000 mL | Freq: Once | INTRAVENOUS | Status: AC | PRN
  Administered 2018-04-14: 100 mL via INTRAVENOUS

## 2018-04-14 NOTE — Progress Notes (Signed)
Name: Whitney Cooper   MRN: [id number]    DOB: 01-18-1961   Date:04/14/2018       Progress Note  Subjective  Chief Complaint  Chief Complaint  Patient presents with  . Abdominal Pain    HPI  Patient presents with left lower quadrant pain shoots to right lower quadrant started Friday after dinner. Notes nausea and soft stools- states not pure liquid but very loose. No blood. Notes pain is a constant nagging but has intermittent acute sharp pains. Says laying on right side helps minimally. Tums was no relief. No constipation, fevers, chills. Does note some suprapubic pressure, dysuria and lower back aching.  States has had kidney stones and diverticulitis in the past- feels like her diverticulitis last episode was January treated with dual antibiotics and symptoms resolved. Has been increasing fiber in diet, avoids red meat and greasy foods.   Patient Active Problem List   Diagnosis Date Noted  . Preventative health care 02/12/2018  . Anxiety 12/13/2017  . Insomnia 04/10/2016  . Upper back pain, chronic 02/10/2016  . Neck pain, chronic 02/10/2016  . Medication monitoring encounter 11/15/2015  . Essential (primary) hypertension 06/21/2015  . Combined fat and carbohydrate induced hyperlipemia 06/21/2015  . Beat, premature ventricular 06/21/2015  . Urinary frequency 03/15/2015  . Strangury 03/15/2015  . Incontinence 03/15/2015  . Right flank pain 03/15/2015  . Atrophic vaginitis 03/15/2015  . Abnormal thyroid function test 01/24/2015  . IFG (impaired fasting glucose) 01/24/2015  . Overweight (BMI 25.0-29.9) 01/19/2015  . Urinary retention with incomplete bladder emptying 01/19/2015  . Hyperlipidemia   . Osteoarthritis   . GERD (gastroesophageal reflux disease)   . Fibromyalgia   . Diverticulitis of colon 07/07/2009    Past Medical History:  Diagnosis Date  . Beat, premature ventricular 06/21/2015  . Bundle branch block   . Diverticulitis of colon 07/07/2009  . Dry eyes   .  Essential (primary) hypertension 06/21/2015  . Fibromyalgia   . GERD (gastroesophageal reflux disease)   . History of kidney stones   . Hyperlipidemia   . Hypertension   . IFG (impaired fasting glucose) 01/24/2015  . OAB (overactive bladder)   . Osteoarthritis   . Osteopenia determined by x-ray 02/10/2016  . Recurrent UTI   . Vertigo     Past Surgical History:  Procedure Laterality Date  . COLONOSCOPY WITH PROPOFOL N/A 11/11/2015   Procedure: COLONOSCOPY WITH PROPOFOL;  Surgeon: Lollie Sails, MD;  Location: [facility] ENDOSCOPY;  Service: Endoscopy;  Laterality: N/A;  . TOTAL ABDOMINAL HYSTERECTOMY  Jan 2012   Complete, due to heavy bleeding and grape-fruit size fibroid     Social History   Tobacco Use  . Smoking status: Never Smoker  . Smokeless tobacco: Never Used  Substance Use Topics  . Alcohol use: No     Current Outpatient Medications:  .  atorvastatin (LIPITOR) 20 MG tablet, Take 1 tablet (20 mg total) by mouth at bedtime., Disp: 30 tablet, Rfl: 5 .  Cholecalciferol (VITAMIN D3) 1000 units CAPS, Take 1 capsule (1,000 Units total) by mouth daily., Disp: , Rfl:  .  Coenzyme Q10 (CO Q-10) 100 MG CAPS, Take by mouth daily. , Disp: , Rfl:  .  diazepam (VALIUM) 2 MG tablet, Take 0.5 tablets (1 mg total) by mouth every 12 (twelve) hours as needed for anxiety., Disp: 15 tablet, Rfl: 0 .  esomeprazole (NEXIUM) 20 MG capsule, Take 1 capsule (20 mg total) by mouth as needed., Disp: 30 capsule, Rfl: 5 .  meclizine (ANTIVERT) 25 MG tablet, Take 25 mg by mouth as needed., Disp: , Rfl:  .  Melatonin 5 MG CAPS, Take 5 mg by mouth at bedtime as needed., Disp: , Rfl:  .  metoprolol succinate (TOPROL-XL) 50 MG 24 hr tablet, Take 1 tablet (50 mg total) by mouth daily., Disp: 30 tablet, Rfl: 11 .  Multiple Vitamin (MULTI-VITAMINS) TABS, Take 1 tablet by mouth daily. , Disp: , Rfl:  .  naproxen sodium (ALEVE) 220 MG tablet, Take 220 mg by mouth daily as needed., Disp: , Rfl:  .  Omega-3 Fatty  Acids (FISH OIL) 1000 MG CAPS, Take 1 capsule by mouth daily., Disp: , Rfl:  .  ondansetron (ZOFRAN-ODT) 4 MG disintegrating tablet, Take 4 mg by mouth every 8 (eight) hours as needed. for nausea, Disp: , Rfl: 0 .  SUMAtriptan (IMITREX) 100 MG tablet, Take 100 mg by mouth as needed., Disp: , Rfl:  .  gabapentin (NEURONTIN) 100 MG capsule, Taper up by 100 mg every 3 nights to a total of 300 mg every night (Patient not taking: Reported on 04/14/2018), Disp: , Rfl:  .  ibuprofen (ADVIL,MOTRIN) 200 MG tablet, Take 200 mg by mouth every 6 (six) hours as needed., Disp: , Rfl:   Allergies  Allergen Reactions  . Baclofen Other (See Comments)    Dizziness  . Codeine Other (See Comments)  . Hydroxyzine   . Sulfa Antibiotics Rash    Review of Systems  Constitutional: Negative for chills, fever and malaise/fatigue.  Respiratory: Negative for shortness of breath.   Cardiovascular: Negative for chest pain.  Musculoskeletal: Negative for myalgias.  Skin: Negative for rash.  Neurological: Negative for dizziness.   No other specific complaints in a complete review of systems (except as listed in HPI above).  Objective  Vitals:   04/14/18 1352  BP: 104/70  Pulse: 100  Resp: 16  Temp: 98.3 F (36.8 C)  TempSrc: Oral  SpO2: 97%  Weight: 145 lb 3.2 oz (65.9 kg)  Height: 5\' 3"  (1.6 m)     Body mass index is 25.72 kg/m.  Nursing Note and Vital Signs reviewed.  Physical Exam  Constitutional: She appears well-developed and well-nourished. She appears distressed (not in acute distress but noted discomfort and abdominal guarding).  HENT:  Head: Normocephalic and atraumatic.  Cardiovascular: Normal rate and normal heart sounds.  Pulmonary/Chest: Effort normal and breath sounds normal.  Abdominal: Soft. Normal appearance. She exhibits no ascites and no mass. Bowel sounds are increased. There is tenderness in the suprapubic area and left lower quadrant. There is guarding (left lower quadrant).  There is no rebound, no CVA tenderness, no tenderness at McBurney's point and negative Murphy's sign.    Neurological: She is alert.  Skin: Skin is warm and dry. No rash noted. She is not diaphoretic.  Psychiatric: She has a normal mood and affect. Her behavior is normal.     Results for orders placed or performed in visit on 04/14/18 (from the past 48 hour(s))  POCT Urinalysis Dipstick     Status: Normal   Collection Time: 04/14/18  2:31 PM  Result Value Ref Range   Color, UA Yellow    Clarity, UA Clear    Glucose, UA Negative Negative   Bilirubin, UA Negative    Ketones, UA Negative    Spec Grav, UA 1.010 1.010 - 1.025   Blood, UA Negative    pH, UA 7.0 5.0 - 8.0   Protein, UA Negative Negative   Urobilinogen,  UA 0.2 0.2 or 1.0 E.U./dL   Nitrite, UA Negative    Leukocytes, UA Negative Negative   Appearance Nomal    Odor Normal     Assessment & Plan  1. Lower abdominal pain Patient very tender in lower quadrant- high suspicion of diverticulitis, discussed ER/warning signs. Sent to hospital for imaging and labs to get same day results. No fevers, chills, blood noted in stools, negative UA. Follow-up in 2 days to ensure improvement.  - POCT Urinalysis Dipstick - CBC with Differential; Future - Comprehensive Metabolic Panel (CMET); Future - Amylase; Future - CT Abdomen Pelvis W Contrast; Future  2. Suprapubic pressure - POCT Urinalysis Dipstick - CT Abdomen Pelvis W Contrast; Future - CT Abdomen Pelvis W Contrast  3. Dysuria - POCT Urinalysis Dipstick: Clear   4. Nausea - CBC with Differential; Future - Comprehensive Metabolic Panel (CMET); Future - Amylase; Future - CT Abdomen Pelvis W Contrast; Future   5. Diarrhea, unspecified type - CBC with Differential; Future - Comprehensive Metabolic Panel (CMET); Future - Amylase; Future - CT Abdomen Pelvis W Contrast; Future   Face-to-face time with patient was more than 25 minutes, >50% time spent counseling and  coordination of care

## 2018-04-14 NOTE — Patient Instructions (Signed)
-   Go to hospital for STAT CT scan and lab work. Will receive call from office about results and plan of care from there.  -Clear liquid diet for 2 days  Abdominal Pain, Adult Many things can cause belly (abdominal) pain. Most times, belly pain is not dangerous. Many cases of belly pain can be watched and treated at home. Sometimes belly pain is serious, though. Your doctor will try to find the cause of your belly pain. Follow these instructions at home:  Take over-the-counter and prescription medicines only as told by your doctor. Do not take medicines that help you poop (laxatives) unless told to by your doctor.  Drink enough fluid to keep your pee (urine) clear or pale yellow.  Watch your belly pain for any changes.  Keep all follow-up visits as told by your doctor. This is important. Contact a doctor if:  Your belly pain changes or gets worse.  You are not hungry, or you lose weight without trying.  You are having trouble pooping (constipated) or have watery poop (diarrhea) for more than 2-3 days.  You have pain when you pee or poop.  Your belly pain wakes you up at night.  Your pain gets worse with meals, after eating, or with certain foods.  You are throwing up and cannot keep anything down.  You have a fever. Get help right away if:  Your pain does not go away as soon as your doctor says it should.  You cannot stop throwing up.  Your pain is only in areas of your belly, such as the right side or the left lower part of the belly.  You have bloody or black poop, or poop that looks like tar.  You have very bad pain, cramping, or bloating in your belly.  You have signs of not having enough fluid or water in your body (dehydration), such as: ? Dark pee, very little pee, or no pee. ? Cracked lips. ? Dry mouth. ? Sunken eyes. ? Sleepiness. ? Weakness. This information is not intended to replace advice given to you by your health care provider. Make sure you discuss  any questions you have with your health care provider. Document Released: 12/26/2007 Document Revised: 01/27/2016 Document Reviewed: 12/21/2015 Elsevier Interactive Patient Education  2018 [employer].

## 2018-04-14 NOTE — Telephone Encounter (Signed)
[place], [place] Technologist called report of CT ABdomen with positive findings, verified results in chart, patient is not in the CT department, advised will route to provider and patient will be called tomorrow due to office has closed for today.

## 2018-04-14 NOTE — Telephone Encounter (Signed)
Pt. Reports started having left lower quadrant abdominal pain last Friday. Describes it as a"sharp pain." Feels "cold, but then I start sweating." Reports low grade fever. Has had regular BM'S. Had 3 loose stools this morning.Appointment made for today. No availability with Dr. Sanda Klein. Instructed if pain worsens, to call back or go to ED. Verbalizes understanding. Reason for Disposition . [1] MODERATE pain (e.g., interferes with normal activities) AND [2] pain comes and goes (cramps) AND [3] present > 24 hours  (Exception: pain with Vomiting or Diarrhea - see that Guideline)  Answer Assessment - Initial Assessment Questions 1. LOCATION: "Where does it hurt?"      Left lower quadrant 2. RADIATION: "Does the pain shoot anywhere else?" (e.g., chest, back)     Into her back 3. ONSET: "When did the pain begin?" (e.g., minutes, hours or days ago)      Friday 4. SUDDEN: "Gradual or sudden onset?"     Gradual 5. PATTERN "Does the pain come and go, or is it constant?"    - If constant: "Is it getting better, staying the same, or worsening?"      (Note: Constant means the pain never goes away completely; most serious pain is constant and it progresses)     - If intermittent: "How long does it last?" "Do you have pain now?"     (Note: Intermittent means the pain goes away completely between bouts)     Constant 6. SEVERITY: "How bad is the pain?"  (e.g., Scale 1-10; mild, moderate, or severe)   - MILD (1-3): doesn't interfere with normal activities, abdomen soft and not tender to touch    - MODERATE (4-7): interferes with normal activities or awakens from sleep, tender to touch    - SEVERE (8-10): excruciating pain, doubled over, unable to do any normal activities      6 7. RECURRENT SYMPTOM: "Have you ever had this type of abdominal pain before?" If so, ask: "When was the last time?" and "What happened that time?"      Yes - Diverticulitis  8. CAUSE: "What do you think is causing the abdominal pain?"    Unsure 9. RELIEVING/AGGRAVATING FACTORS: "What makes it better or worse?" (e.g., movement, antacids, bowel movement)     Feels better if she lays on right side. 10. OTHER SYMPTOMS: "Has there been any vomiting, diarrhea, constipation, or urine problems?"       Low grade temp. Small amount of diarrhea this morning. 11. PREGNANCY: "Is there any chance you are pregnant?" "When was your last menstrual period?"       No  Protocols used: ABDOMINAL PAIN - [facility]

## 2018-04-15 ENCOUNTER — Encounter: Payer: Self-pay | Admitting: Nurse Practitioner

## 2018-04-15 ENCOUNTER — Telehealth: Payer: Self-pay | Admitting: Family Medicine

## 2018-04-15 DIAGNOSIS — I700 Atherosclerosis of aorta: Secondary | ICD-10-CM | POA: Insufficient documentation

## 2018-04-15 NOTE — Telephone Encounter (Signed)
-----   Message from Fredderick Severance, NP sent at 04/14/2018  5:14 PM EDT ----- Please schedule appointment with patient for wednesday or Thursday this week. Thanks.

## 2018-04-15 NOTE — Telephone Encounter (Signed)
Please check results

## 2018-04-15 NOTE — Telephone Encounter (Signed)
Left voice message on [phone] @ 8:14 asking pt to return call to schedule an appt with Benjamine Mola for this week.

## 2018-04-15 NOTE — Telephone Encounter (Signed)
Pt has been scheduled for thurs, 9/26 at 10 am

## 2018-04-16 ENCOUNTER — Ambulatory Visit: Payer: Self-pay | Admitting: Family Medicine

## 2018-04-17 ENCOUNTER — Encounter: Payer: Self-pay | Admitting: Nurse Practitioner

## 2018-04-17 ENCOUNTER — Ambulatory Visit: Payer: 59 | Admitting: Nurse Practitioner

## 2018-04-17 VITALS — BP 116/82 | HR 85 | Temp 97.9°F | Resp 16 | Ht 63.0 in | Wt 143.2 lb

## 2018-04-17 DIAGNOSIS — R9341 Abnormal radiologic findings on diagnostic imaging of renal pelvis, ureter, or bladder: Secondary | ICD-10-CM | POA: Diagnosis not present

## 2018-04-17 DIAGNOSIS — R946 Abnormal results of thyroid function studies: Secondary | ICD-10-CM | POA: Diagnosis not present

## 2018-04-17 DIAGNOSIS — K5732 Diverticulitis of large intestine without perforation or abscess without bleeding: Secondary | ICD-10-CM

## 2018-04-17 NOTE — Progress Notes (Signed)
Name: Whitney Cooper   MRN: [id number]    DOB: 1960-09-22   Date:04/17/2018       Progress Note  Subjective  Chief Complaint  Chief Complaint  Patient presents with  . Results    follow up on results from ct scan for abdominal pain.  . Diarrhea    x 1 day    HPI  This is second bout of diverticulitis this year, has had them in the past but notes they are getting worse and more frequent. Started antibiotics 2 days ago, feeling significant improvement from abodominal pain, diarrhea started yesterday- states had clear liquids for the first 2 days and some crackers recently. Mild nausea.  No pneumaturia, fevers, chills.   Patient Active Problem List   Diagnosis Date Noted  . Aortic atherosclerosis ([place]) 04/15/2018  . Preventative health care 02/12/2018  . Anxiety 12/13/2017  . Insomnia 04/10/2016  . Upper back pain, chronic 02/10/2016  . Neck pain, chronic 02/10/2016  . Medication monitoring encounter 11/15/2015  . Essential (primary) hypertension 06/21/2015  . Combined fat and carbohydrate induced hyperlipemia 06/21/2015  . Beat, premature ventricular 06/21/2015  . Urinary frequency 03/15/2015  . Strangury 03/15/2015  . Incontinence 03/15/2015  . Atrophic vaginitis 03/15/2015  . Abnormal thyroid function test 01/24/2015  . IFG (impaired fasting glucose) 01/24/2015  . Overweight (BMI 25.0-29.9) 01/19/2015  . Urinary retention with incomplete bladder emptying 01/19/2015  . Hyperlipidemia   . Osteoarthritis   . GERD (gastroesophageal reflux disease)   . Fibromyalgia   . Diverticulitis of colon 07/07/2009    Past Medical History:  Diagnosis Date  . Beat, premature ventricular 06/21/2015  . Bundle branch block   . Diverticulitis of colon 07/07/2009  . Dry eyes   . Essential (primary) hypertension 06/21/2015  . Fibromyalgia   . GERD (gastroesophageal reflux disease)   . History of kidney stones   . Hyperlipidemia   . Hypertension   . IFG (impaired fasting glucose)  01/24/2015  . OAB (overactive bladder)   . Osteoarthritis   . Osteopenia determined by x-ray 02/10/2016  . Recurrent UTI   . Vertigo     Past Surgical History:  Procedure Laterality Date  . COLONOSCOPY WITH PROPOFOL N/A 11/11/2015   Procedure: COLONOSCOPY WITH PROPOFOL;  Surgeon: Lollie Sails, MD;  Location: [facility] ENDOSCOPY;  Service: Endoscopy;  Laterality: N/A;  . TOTAL ABDOMINAL HYSTERECTOMY  Jan 2012   Complete, due to heavy bleeding and grape-fruit size fibroid     Social History   Tobacco Use  . Smoking status: Never Smoker  . Smokeless tobacco: Never Used  Substance Use Topics  . Alcohol use: No     Current Outpatient Medications:  .  atorvastatin (LIPITOR) 20 MG tablet, Take 1 tablet (20 mg total) by mouth at bedtime., Disp: 30 tablet, Rfl: 5 .  Cholecalciferol (VITAMIN D3) 1000 units CAPS, Take 1 capsule (1,000 Units total) by mouth daily., Disp: , Rfl:  .  ciprofloxacin (CIPRO) 500 MG tablet, Take 1 tablet (500 mg total) by mouth 2 (two) times daily., Disp: 20 tablet, Rfl: 0 .  Coenzyme Q10 (CO Q-10) 100 MG CAPS, Take by mouth daily. , Disp: , Rfl:  .  diazepam (VALIUM) 2 MG tablet, Take 0.5 tablets (1 mg total) by mouth every 12 (twelve) hours as needed for anxiety., Disp: 15 tablet, Rfl: 0 .  esomeprazole (NEXIUM) 20 MG capsule, Take 1 capsule (20 mg total) by mouth as needed., Disp: 30 capsule, Rfl: 5 .  gabapentin (NEURONTIN)  100 MG capsule, Taper up by 100 mg every 3 nights to a total of 300 mg every night, Disp: , Rfl:  .  ibuprofen (ADVIL,MOTRIN) 200 MG tablet, Take 200 mg by mouth every 6 (six) hours as needed., Disp: , Rfl:  .  meclizine (ANTIVERT) 25 MG tablet, Take 25 mg by mouth as needed., Disp: , Rfl:  .  Melatonin 5 MG CAPS, Take 5 mg by mouth at bedtime as needed., Disp: , Rfl:  .  metoprolol succinate (TOPROL-XL) 50 MG 24 hr tablet, Take 1 tablet (50 mg total) by mouth daily., Disp: 30 tablet, Rfl: 11 .  metroNIDAZOLE (FLAGYL) 500 MG tablet, Take 1  tablet (500 mg total) by mouth 3 (three) times daily for 10 days., Disp: 30 tablet, Rfl: 0 .  Multiple Vitamin (MULTI-VITAMINS) TABS, Take 1 tablet by mouth daily. , Disp: , Rfl:  .  naproxen sodium (ALEVE) 220 MG tablet, Take 220 mg by mouth daily as needed., Disp: , Rfl:  .  Omega-3 Fatty Acids (FISH OIL) 1000 MG CAPS, Take 1 capsule by mouth daily., Disp: , Rfl:  .  ondansetron (ZOFRAN-ODT) 4 MG disintegrating tablet, Take 4 mg by mouth every 8 (eight) hours as needed. for nausea, Disp: , Rfl: 0 .  SUMAtriptan (IMITREX) 100 MG tablet, Take 100 mg by mouth as needed., Disp: , Rfl:   Allergies  Allergen Reactions  . Baclofen Other (See Comments)    Dizziness  . Codeine Other (See Comments)  . Hydroxyzine   . Sulfa Antibiotics Rash    ROS   No other specific complaints in a complete review of systems (except as listed in HPI above).  Objective  Vitals:   04/17/18 0942 04/17/18 0954  BP: 130/90 116/82  Pulse: 85   Resp: 16   Temp: 97.9 F (36.6 C)   TempSrc: Oral   SpO2: 99%   Weight: 143 lb 3.2 oz (65 kg)   Height: 5\' 3"  (1.6 m)      Body mass index is 25.37 kg/m.  Nursing Note and Vital Signs reviewed.  Physical Exam  Constitutional: She is oriented to person, place, and time. She appears well-developed and well-nourished.  Cardiovascular: Normal rate and regular rhythm.  Pulmonary/Chest: Effort normal and breath sounds normal.  Abdominal: Soft. Bowel sounds are increased. There is no tenderness.  Musculoskeletal: Normal range of motion.  Neurological: She is alert and oriented to person, place, and time.  Skin: Skin is warm and dry. No erythema.  Psychiatric: She has a normal mood and affect. Her behavior is normal. Judgment and thought content normal.       No results found for this or any previous visit (from the past 48 hour(s)).  Assessment & Plan  1. Diverticulitis of sigmoid colon Improving, continue antibiotics, take phenergan for nausea PRN  -  Ambulatory referral to General Surgery  2. Abnormal thyroid function test - TSH  3. Abnormal CT scan, bladder - Ambulatory referral to General Surgery

## 2018-04-17 NOTE — Patient Instructions (Signed)
-   Increase to soft diet as tolerated  - Can use Peptobismol very sparingly; try to avoid if able to tolerate diarrhea, if symptoms are not improved let us know.  - Will receive phone call to schedule with general surgery to discuss diverticulitis and air in bladder in more detail.

## 2018-04-18 ENCOUNTER — Other Ambulatory Visit: Payer: Self-pay | Admitting: Nurse Practitioner

## 2018-04-18 LAB — TSH: TSH: 5.15 u[IU]/mL — ABNORMAL HIGH (ref 0.450–4.500)

## 2018-04-21 ENCOUNTER — Other Ambulatory Visit: Payer: Self-pay | Admitting: Family Medicine

## 2018-04-24 ENCOUNTER — Ambulatory Visit: Payer: 59 | Admitting: Nurse Practitioner

## 2018-04-24 ENCOUNTER — Encounter: Payer: Self-pay | Admitting: Nurse Practitioner

## 2018-04-24 VITALS — BP 130/80 | HR 75 | Temp 97.9°F | Resp 16 | Ht 63.0 in | Wt 139.5 lb

## 2018-04-24 DIAGNOSIS — K5732 Diverticulitis of large intestine without perforation or abscess without bleeding: Secondary | ICD-10-CM

## 2018-04-24 DIAGNOSIS — R42 Dizziness and giddiness: Secondary | ICD-10-CM | POA: Diagnosis not present

## 2018-04-24 DIAGNOSIS — F410 Panic disorder [episodic paroxysmal anxiety] without agoraphobia: Secondary | ICD-10-CM

## 2018-04-24 DIAGNOSIS — F411 Generalized anxiety disorder: Secondary | ICD-10-CM

## 2018-04-24 DIAGNOSIS — E782 Mixed hyperlipidemia: Secondary | ICD-10-CM

## 2018-04-24 DIAGNOSIS — I10 Essential (primary) hypertension: Secondary | ICD-10-CM

## 2018-04-24 MED ORDER — DULOXETINE HCL 20 MG PO CPEP: 20.0000 mg | ORAL_CAPSULE | Freq: Every day | ORAL | 2 refills | Status: DC

## 2018-04-24 MED ORDER — ATORVASTATIN CALCIUM 20 MG PO TABS: 20.0000 mg | ORAL_TABLET | ORAL | 5 refills | Status: DC

## 2018-04-24 MED ORDER — METOPROLOL SUCCINATE ER 25 MG PO TB24: 25.0000 mg | ORAL_TABLET | Freq: Every day | ORAL | 2 refills | Status: DC

## 2018-04-24 NOTE — Progress Notes (Signed)
Name: Whitney Cooper   MRN: [id number]    DOB: November 02, 1960   Date:04/24/2018       Progress Note  Subjective  Chief Complaint  Chief Complaint  Patient presents with  . Abdominal Pain  . Anxiety    HPI  Patient is here for follow-up of diverticulitis- today is the last day of antibiotic treatments; overall feeling much betters. States some hemorrhoids flaring up. Soft stools- no diarrhea anymore. Mild nausea intermittently, no vomiting hasnt tried phenergan because its not that bad. Increased appetite. Has appointment with general surgery in 2 weeks.  Wt Readings from Last 3 Encounters:  04/24/18 139 lb 8 oz (63.3 kg)  04/17/18 143 lb 3.2 oz (65 kg)  04/14/18 145 lb 3.2 oz (65.9 kg)    Patient states she was out all last week and came back to work with a mess because 2 other people were out as well. Had increased anxiety because of this; waking up at 5 AM with racing thoughts and palpitations due to feeling overwhelmed. Tried to write down a list of thoughts in her head and get them  GAD 7 : Generalized Anxiety Score 04/24/2018 11/04/2017  Nervous, Anxious, on Edge 2 3  Control/stop worrying 3 3  Worry too much - different things 3 3  Trouble relaxing 3 3  Restless 3 3  Easily annoyed or irritable 2 2  Afraid - awful might happen 2 1  Total GAD 7 Score 18 18  Anxiety Difficulty Somewhat difficult Somewhat difficult    Notes some lightheadedness with position change. Drinking plenty of water.  BP Readings from Last 3 Encounters:  04/24/18 104/68  04/17/18 116/82  04/14/18 104/70       Patient Active Problem List   Diagnosis Date Noted  . Aortic atherosclerosis ([place]) 04/15/2018  . Preventative health care 02/12/2018  . Anxiety 12/13/2017  . Insomnia 04/10/2016  . Upper back pain, chronic 02/10/2016  . Neck pain, chronic 02/10/2016  . Medication monitoring encounter 11/15/2015  . Essential (primary) hypertension 06/21/2015  . Combined fat and carbohydrate induced  hyperlipemia 06/21/2015  . Beat, premature ventricular 06/21/2015  . Urinary frequency 03/15/2015  . Strangury 03/15/2015  . Incontinence 03/15/2015  . Atrophic vaginitis 03/15/2015  . Abnormal thyroid function test 01/24/2015  . IFG (impaired fasting glucose) 01/24/2015  . Overweight (BMI 25.0-29.9) 01/19/2015  . Urinary retention with incomplete bladder emptying 01/19/2015  . Hyperlipidemia   . Osteoarthritis   . GERD (gastroesophageal reflux disease)   . Fibromyalgia   . Diverticulitis of colon 07/07/2009    Past Medical History:  Diagnosis Date  . Beat, premature ventricular 06/21/2015  . Bundle branch block   . Diverticulitis of colon 07/07/2009  . Dry eyes   . Essential (primary) hypertension 06/21/2015  . Fibromyalgia   . GERD (gastroesophageal reflux disease)   . History of kidney stones   . Hyperlipidemia   . Hypertension   . IFG (impaired fasting glucose) 01/24/2015  . OAB (overactive bladder)   . Osteoarthritis   . Osteopenia determined by x-ray 02/10/2016  . Recurrent UTI   . Vertigo     Past Surgical History:  Procedure Laterality Date  . COLONOSCOPY WITH PROPOFOL N/A 11/11/2015   Procedure: COLONOSCOPY WITH PROPOFOL;  Surgeon: Lollie Sails, MD;  Location: [facility] ENDOSCOPY;  Service: Endoscopy;  Laterality: N/A;  . TOTAL ABDOMINAL HYSTERECTOMY  Jan 2012   Complete, due to heavy bleeding and grape-fruit size fibroid     Social History  Tobacco Use  . Smoking status: Never Smoker  . Smokeless tobacco: Never Used  Substance Use Topics  . Alcohol use: No     Current Outpatient Medications:  .  atorvastatin (LIPITOR) 20 MG tablet, Take 1 tablet (20 mg total) by mouth at bedtime., Disp: 30 tablet, Rfl: 5 .  Cholecalciferol (VITAMIN D3) 1000 units CAPS, Take 1 capsule (1,000 Units total) by mouth daily., Disp: , Rfl:  .  Coenzyme Q10 (CO Q-10) 100 MG CAPS, Take by mouth daily. , Disp: , Rfl:  .  diazepam (VALIUM) 2 MG tablet, Take 0.5 tablets (1 mg  total) by mouth every 12 (twelve) hours as needed for anxiety., Disp: 15 tablet, Rfl: 0 .  esomeprazole (NEXIUM) 20 MG capsule, Take 1 capsule (20 mg total) by mouth as needed., Disp: 30 capsule, Rfl: 5 .  gabapentin (NEURONTIN) 100 MG capsule, Taper up by 100 mg every 3 nights to a total of 300 mg every night, Disp: , Rfl:  .  ibuprofen (ADVIL,MOTRIN) 200 MG tablet, Take 200 mg by mouth every 6 (six) hours as needed., Disp: , Rfl:  .  meclizine (ANTIVERT) 25 MG tablet, Take 25 mg by mouth as needed., Disp: , Rfl:  .  Melatonin 5 MG CAPS, Take 5 mg by mouth at bedtime as needed., Disp: , Rfl:  .  metoprolol succinate (TOPROL-XL) 50 MG 24 hr tablet, Take 1 tablet (50 mg total) by mouth daily., Disp: 30 tablet, Rfl: 0 .  Multiple Vitamin (MULTI-VITAMINS) TABS, Take 1 tablet by mouth daily. , Disp: , Rfl:  .  naproxen sodium (ALEVE) 220 MG tablet, Take 220 mg by mouth daily as needed., Disp: , Rfl:  .  Omega-3 Fatty Acids (FISH OIL) 1000 MG CAPS, Take 1 capsule by mouth daily., Disp: , Rfl:  .  ondansetron (ZOFRAN-ODT) 4 MG disintegrating tablet, Take 4 mg by mouth every 8 (eight) hours as needed. for nausea, Disp: , Rfl: 0 .  SUMAtriptan (IMITREX) 100 MG tablet, Take 100 mg by mouth as needed., Disp: , Rfl:  .  ciprofloxacin (CIPRO) 500 MG tablet, Take 1 tablet (500 mg total) by mouth 2 (two) times daily. (Patient not taking: Reported on 04/24/2018), Disp: 20 tablet, Rfl: 0 .  metroNIDAZOLE (FLAGYL) 500 MG tablet, Take 1 tablet (500 mg total) by mouth 3 (three) times daily for 10 days. (Patient not taking: Reported on 04/24/2018), Disp: 30 tablet, Rfl: 0  Allergies  Allergen Reactions  . Baclofen Other (See Comments)    Dizziness  . Codeine Other (See Comments)  . Hydroxyzine   . Sulfa Antibiotics Rash    ROS   No other specific complaints in a complete review of systems (except as listed in HPI above).  Objective  Vitals:   04/24/18 0851  BP: 104/68  Pulse: 98  Resp: 16  Temp: 97.9  F (36.6 C)  TempSrc: Oral  SpO2: 98%  Weight: 139 lb 8 oz (63.3 kg)  Height: 5\' 3"  (1.6 m)    Body mass index is 24.71 kg/m.  Nursing Note and Vital Signs reviewed.  Physical Exam  Constitutional: She is oriented to person, place, and time. She appears well-developed and well-nourished. She does not appear ill.  Cardiovascular: Normal rate, normal heart sounds and intact distal pulses.  Pulmonary/Chest: Effort normal and breath sounds normal. She exhibits no tenderness.  Abdominal: Soft. Normal appearance and normal aorta. Bowel sounds are absent. There is no tenderness. There is no rigidity and no guarding.  Neurological: She  is alert and oriented to person, place, and time.  Skin: Skin is warm and dry. Capillary refill takes less than 2 seconds. No rash noted.  Psychiatric: She has a normal mood and affect. Her behavior is normal.       No results found for this or any previous visit (from the past 48 hour(s)).  Assessment & Plan 1. Diverticulitis of colon Resolving, follow-up with surgery   2. GAD (generalized anxiety disorder) Counseling, re-evaluate in 4 weeks.  - DULoxetine (CYMBALTA) 20 MG capsule; Take 1 capsule (20 mg total) by mouth daily.  Dispense: 30 capsule; Refill: 2  3. Panic attack - DULoxetine (CYMBALTA) 20 MG capsule; Take 1 capsule (20 mg total) by mouth daily.  Dispense: 30 capsule; Refill: 2  4. Lightheadedness -decreased metoprolol to 25mg  daily, - BASIC METABOLIC PANEL WITH GFR  5. Mixed hyperlipidemia States she is taking it every other day  - atorvastatin (LIPITOR) 20 MG tablet; Take 1 tablet (20 mg total) by mouth every other day.  Dispense: 30 tablet; Refill: 5  6. Essential (primary) hypertension Low normal blood presure, and symptomatic cut metoprolol in half and re-evaluate in one week.  - metoprolol succinate (TOPROL-XL) 25 MG 24 hr tablet; Take 1 tablet (25 mg total) by mouth daily.  Dispense: 30 tablet; Refill: 2

## 2018-04-24 NOTE — Patient Instructions (Addendum)
-   Take cymbalta first thing in the morning with some food and a full glass of water. - Try deep (diaphragmatic) breathing. Inhale slowly through your nose for five seconds and exhale through your mouth. When having anxious episodes.; can take vistaril as needed for panic.  - Take half of your metoprolol (25mg  total) a day and come back in one week for a blood pressure recheck (nurse visit)

## 2018-04-25 LAB — BASIC METABOLIC PANEL
BUN/Creatinine Ratio: 10 (ref 9–23)
BUN: 8 mg/dL (ref 6–24)
CALCIUM: 9.7 mg/dL (ref 8.7–10.2)
CO2: 26 mmol/L (ref 20–29)
CREATININE: 0.78 mg/dL (ref 0.57–1.00)
Chloride: 103 mmol/L (ref 96–106)
GFR, EST AFRICAN AMERICAN: 96 mL/min/{1.73_m2} (ref >59)
GFR, EST NON AFRICAN AMERICAN: 83 mL/min/{1.73_m2} (ref >59)
Glucose: 89 mg/dL (ref 65–99)
POTASSIUM: 5 mmol/L (ref 3.5–5.2)
Sodium: 143 mmol/L (ref 134–144)

## 2018-04-29 ENCOUNTER — Ambulatory Visit: Payer: Self-pay | Admitting: Family Medicine

## 2018-05-02 ENCOUNTER — Ambulatory Visit: Payer: 59

## 2018-05-02 VITALS — BP 122/78 | HR 88

## 2018-05-02 DIAGNOSIS — I10 Essential (primary) hypertension: Secondary | ICD-10-CM

## 2018-05-02 NOTE — Progress Notes (Signed)
Patient is here for blood pressure check since decreasing her metoprolol 25mg .  Pt denies any side effects and blood pressure today is 122/78 and pulse 88.  I consulted with Dr. Sanda Klein and we will continue current regimen.

## 2018-05-07 ENCOUNTER — Encounter: Payer: Self-pay | Admitting: Surgery

## 2018-05-07 ENCOUNTER — Other Ambulatory Visit: Payer: Self-pay

## 2018-05-07 ENCOUNTER — Ambulatory Visit: Payer: 59 | Admitting: Surgery

## 2018-05-07 VITALS — BP 137/82 | HR 81 | Temp 97.2°F | Ht 64.0 in | Wt 142.2 lb

## 2018-05-07 DIAGNOSIS — K5732 Diverticulitis of large intestine without perforation or abscess without bleeding: Secondary | ICD-10-CM

## 2018-05-07 MED ORDER — AMOXICILLIN-POT CLAVULANATE 875-125 MG PO TABS: 1.0000 | ORAL_TABLET | Freq: Two times a day (BID) | ORAL | 0 refills | Status: AC

## 2018-05-07 NOTE — Patient Instructions (Addendum)
Please start your Antibiotic today.  Please see your follow up appointment listed below.   Diverticulitis Diverticulitis is when small pockets in your large intestine (colon) get infected or swollen. This causes stomach pain and watery poop (diarrhea). These pouches are called diverticula. They form in people who have a condition called diverticulosis. Follow these instructions at home: Medicines  Take over-the-counter and prescription medicines only as told by your doctor. These include: ? Antibiotics. ? Pain medicines. ? Fiber pills. ? Probiotics. ? Stool softeners.  Do not drive or use heavy machinery while taking prescription pain medicine.  If you were prescribed an antibiotic, take it as told. Do not stop taking it even if you feel better. General instructions  Follow a diet as told by your doctor.  When you feel better, your doctor may tell you to change your diet. You may need to eat a lot of fiber. Fiber makes it easier to poop (have bowel movements). Healthy foods with fiber include: ? Berries. ? Beans. ? Lentils. ? Green vegetables.  Exercise 3 or more times a week. Aim for 30 minutes each time. Exercise enough to sweat and make your heart beat faster.  Keep all follow-up visits as told. This is important. You may need to have an exam of the large intestine. This is called a colonoscopy. Contact a doctor if:  Your pain does not get better.  You have a hard time eating or drinking.  You are not pooping like normal. Get help right away if:  Your pain gets worse.  Your problems do not get better.  Your problems get worse very fast.  You have a fever.  You throw up (vomit) more than one time.  You have poop that is: ? Bloody. ? Black. ? Tarry. Summary  Diverticulitis is when small pockets in your large intestine (colon) get infected or swollen.  Take medicines only as told by your doctor.  Follow a diet as told by your doctor. This information is  not intended to replace advice given to you by your health care provider. Make sure you discuss any questions you have with your health care provider. Document Released: 12/26/2007 Document Revised: 07/26/2016 Document Reviewed: 07/26/2016 Elsevier Interactive Patient Education  2017 [employer].

## 2018-05-09 NOTE — Progress Notes (Signed)
Patient ID: Whitney Cooper, female   DOB: 12-25-1960, 57 y.o.   MRN: [id number]  HPI Whitney Cooper is a 57 y.o. female seen in consultation at the request of Mrs Poulouse NP ( case d/w her in detail), recurrent diverticulitis.  She reports that so far she has had a total of 3 episode of diverticulitis and 2 within the last 8 months.  She reports that she has left lower quadrant abdominal pain that is moderate in intensity and sharp in nature.  She also reports some cramping and diarrhea as well.  Fevers no chills.  Last colonoscopy was in 2017 by Dr. Gustavo Lah showing no cancer or suspicious lesions.  She is able to perform more than 4 METS of activity without any shortness of her chest pain.  She did Have a history of total abdominal hysterectomy in the past. She denies any evidence of pneumaturia.  She had a recent CT scan that I have personally reviewed showing evidence of diverticulitis with asymmetric mucosal edema.  There is a 1 bubble of air within the bladder.  Her CBC was 13.6 with a normal H&H and platelets.  Her BMP was completely normal   HPI  Past Medical History:  Diagnosis Date  . Beat, premature ventricular 06/21/2015  . Bundle branch block   . Diverticulitis of colon 07/07/2009  . Dry eyes   . Essential (primary) hypertension 06/21/2015  . Fibromyalgia   . GERD (gastroesophageal reflux disease)   . History of kidney stones   . Hyperlipidemia   . Hypertension   . IFG (impaired fasting glucose) 01/24/2015  . OAB (overactive bladder)   . Osteoarthritis   . Osteopenia determined by x-ray 02/10/2016  . Recurrent UTI   . Vertigo     Past Surgical History:  Procedure Laterality Date  . COLONOSCOPY WITH PROPOFOL N/A 11/11/2015   Procedure: COLONOSCOPY WITH PROPOFOL;  Surgeon: Lollie Sails, MD;  Location: [facility] ENDOSCOPY;  Service: Endoscopy;  Laterality: N/A;  . TOTAL ABDOMINAL HYSTERECTOMY  Jan 2012   Complete, due to heavy bleeding and grape-fruit size fibroid     Family  History  Problem Relation Age of Onset  . Hypertension Mother   . Cancer Mother        skin  . Heart disease Father        CHF  . Hypertension Father   . Stroke Father        mini strokes  . Cancer Father        skin  . Heart attack Father   . Thyroid disease Father   . Ovarian cancer Maternal Grandmother 66  . Cancer Maternal Grandmother        ovarian/brain  . Heart attack Maternal Grandfather   . Heart attack Paternal Grandfather   . Diabetes Maternal Uncle   . Thyroid disease Sister   . Kidney disease Paternal Grandmother   . Breast cancer Neg Hx     Social History Social History   Tobacco Use  . Smoking status: Never Smoker  . Smokeless tobacco: Never Used  Substance Use Topics  . Alcohol use: No  . Drug use: No    Allergies  Allergen Reactions  . Baclofen Other (See Comments)    Dizziness  . Codeine Other (See Comments)  . Hydroxyzine   . Sulfa Antibiotics Rash    Current Outpatient Medications  Medication Sig Dispense Refill  . atorvastatin (LIPITOR) 20 MG tablet Take 1 tablet (20 mg total) by mouth every other  day. 30 tablet 5  . Cholecalciferol (VITAMIN D3) 1000 units CAPS Take 1 capsule (1,000 Units total) by mouth daily.    . Coenzyme Q10 (CO Q-10) 100 MG CAPS Take by mouth daily.     . diazepam (VALIUM) 2 MG tablet Take 0.5 tablets (1 mg total) by mouth every 12 (twelve) hours as needed for anxiety. 15 tablet 0  . esomeprazole (NEXIUM) 20 MG capsule Take 1 capsule (20 mg total) by mouth as needed. 30 capsule 5  . ibuprofen (ADVIL,MOTRIN) 200 MG tablet Take 200 mg by mouth every 6 (six) hours as needed.    . meclizine (ANTIVERT) 25 MG tablet Take 25 mg by mouth as needed.    . Melatonin 5 MG CAPS Take 5 mg by mouth at bedtime as needed.    . metoprolol succinate (TOPROL-XL) 25 MG 24 hr tablet Take 1 tablet (25 mg total) by mouth daily. 30 tablet 2  . Multiple Vitamin (MULTI-VITAMINS) TABS Take 1 tablet by mouth daily.     . naproxen sodium (ALEVE)  220 MG tablet Take 220 mg by mouth daily as needed.    . Omega-3 Fatty Acids (FISH OIL) 1000 MG CAPS Take 1 capsule by mouth daily.    . ondansetron (ZOFRAN-ODT) 4 MG disintegrating tablet Take 4 mg by mouth every 8 (eight) hours as needed. for nausea  0  . SUMAtriptan (IMITREX) 100 MG tablet Take 100 mg by mouth as needed.    Marland Kitchen amoxicillin-clavulanate (AUGMENTIN) 875-125 MG tablet Take 1 tablet by mouth 2 (two) times daily for 7 days. 14 tablet 0   No current facility-administered medications for this visit.      Review of Systems Full ROS  was asked and was negative except for the information on the HPI  Physical Exam Blood pressure 137/82, pulse 81, temperature (!) 97.2 F (36.2 C), temperature source Temporal, height 5\' 4"  (1.626 m), weight 142 lb 3.2 oz (64.5 kg), SpO2 99 %. CONSTITUTIONAL: NAD EYES: Pupils are equal, round, and reactive to light, Sclera are non-icteric. EARS, NOSE, MOUTH AND THROAT: The oropharynx is clear. The oral mucosa is pink and moist. Hearing is intact to voice. LYMPH NODES:  Lymph nodes in the neck are normal. RESPIRATORY:  Lungs are clear. There is normal respiratory effort, with equal breath sounds bilaterally, and without pathologic use of accessory muscles. CARDIOVASCULAR: Heart is regular without murmurs, gallops, or rubs. GI: The abdomen is  soft, mild TTP LLQ, no peritonitis. There are no palpable masses. There is no hepatosplenomegaly. There are normal bowel sounds in all quadrants. GU: Rectal deferred.   MUSCULOSKELETAL: Normal muscle strength and tone. No cyanosis or edema.   SKIN: Turgor is good and there are no pathologic skin lesions or ulcers. NEUROLOGIC: Motor and sensation is grossly normal. Cranial nerves are grossly intact. PSYCH:  Oriented to person, place and time. Affect is normal.  Data Reviewed  I have personally reviewed the patient's imaging, laboratory findings and medical records.    Assessment/Plan 57 year old female with  recurrent episode of diverticulitis ( no clinical evidence of colovesical fistula) now this is number fourth and number therapy within the last 9 months.  Discussed with the patient in detail about her disease process.  We will try another course of antibiotic at this time since she had Cipro and Flagyl we will try Augmentin. With the patient as well that we may need to repeat her colonoscopy to make sure there is no other potential malignant process within her colon.  8 this is will be in ideal circumstances if she continues to have chronic symptoms we may have to be forced to do and to expedite her surgery without more recent colonoscopy. We will see her back in a few weeks to make sure that she is going in the right direction.  There is no need for any emergent surgical intervention at this time.  We talked about the importance about repeat colonoscopy in about 6 weeks once her acute episode subsides  Copy of this report will be sent to the referring provider  Caroleen Hamman, MD FACS General Surgeon 05/09/2018, 10:40 AM

## 2018-05-15 ENCOUNTER — Ambulatory Visit: Payer: 59 | Admitting: Family Medicine

## 2018-05-15 ENCOUNTER — Encounter: Payer: Self-pay | Admitting: Family Medicine

## 2018-05-15 VITALS — BP 120/70 | HR 95 | Temp 98.0°F | Resp 16 | Ht 64.0 in | Wt 141.4 lb

## 2018-05-15 DIAGNOSIS — F419 Anxiety disorder, unspecified: Secondary | ICD-10-CM | POA: Diagnosis not present

## 2018-05-15 DIAGNOSIS — J029 Acute pharyngitis, unspecified: Secondary | ICD-10-CM

## 2018-05-15 MED ORDER — BENZONATATE 100 MG PO CAPS: 100.0000 mg | ORAL_CAPSULE | Freq: Three times a day (TID) | ORAL | 0 refills | Status: DC | PRN

## 2018-05-15 MED ORDER — DIAZEPAM 2 MG PO TABS: 0.2500 mg | ORAL_TABLET | Freq: Two times a day (BID) | ORAL | 0 refills | Status: DC | PRN

## 2018-05-15 NOTE — Patient Instructions (Addendum)
Please do eat yogurt or kimchi or take a probiotic daily for the next month We want to replace the healthy germs in the gut If you notice foul, watery diarrhea in the next two months, schedule an appointment RIGHT AWAY or go to an urgent care or the emergency room if a holiday or over a weekend   Upper Respiratory Infection, Adult Most upper respiratory infections (URIs) are caused by a virus. A URI affects the nose, throat, and upper air passages. The most common type of URI is often called "the common cold." Follow these instructions at home:  Take medicines only as told by your doctor.  Gargle warm saltwater or take cough drops to comfort your throat as told by your doctor.  Use a warm mist humidifier or inhale steam from a shower to increase air moisture. This may make it easier to breathe.  Drink enough fluid to keep your pee (urine) clear or pale yellow.  Eat soups and other clear broths.  Have a healthy diet.  Rest as needed.  Go back to work when your fever is gone or your doctor says it is okay. ? You may need to stay home longer to avoid giving your URI to others. ? You can also wear a face mask and wash your hands often to prevent spread of the virus.  Use your inhaler more if you have asthma.  Do not use any tobacco products, including cigarettes, chewing tobacco, or electronic cigarettes. If you need help quitting, ask your doctor. Contact a doctor if:  You are getting worse, not better.  Your symptoms are not helped by medicine.  You have chills.  You are getting more short of breath.  You have brown or red mucus.  You have yellow or brown discharge from your nose.  You have pain in your face, especially when you bend forward.  You have a fever.  You have puffy (swollen) neck glands.  You have pain while swallowing.  You have white areas in the back of your throat. Get help right away if:  You have very bad or constant: ? Headache. ? Ear  pain. ? Pain in your forehead, behind your eyes, and over your cheekbones (sinus pain). ? Chest pain.  You have long-lasting (chronic) lung disease and any of the following: ? Wheezing. ? Long-lasting cough. ? Coughing up blood. ? A change in your usual mucus.  You have a stiff neck.  You have changes in your: ? Vision. ? Hearing. ? Thinking. ? Mood. This information is not intended to replace advice given to you by your health care provider. Make sure you discuss any questions you have with your health care provider. Document Released: 12/26/2007 Document Revised: 03/11/2016 Document Reviewed: 10/14/2013 Elsevier Interactive Patient Education  2018 [employer].  Never ever combine the diazepam with prescription cough syrup, other nerve pills, or pain pills

## 2018-05-15 NOTE — Progress Notes (Signed)
BP 120/70 (BP Location: Left Arm, Patient Position: Sitting, Cuff Size: Normal)   Pulse 95   Temp 98 F (36.7 C) (Oral)   Resp 16   Ht 5\' 4"  (1.626 m)   Wt 141 lb 6.4 oz (64.1 kg)   SpO2 97%   BMI 24.27 kg/m    Subjective:    Patient ID: Whitney Cooper, female    DOB: 01/10/1961, 57 y.o.   MRN: [id number]  HPI: Whitney Cooper is a 57 y.o. female  Chief Complaint  Patient presents with  . Sore Throat    She has been on antibiotics, she took her last pill today. Throat has been sore x 5 day. Treating soreness with Coricidin x 2 days. Productive cough with light green mucous.  . Nasal Congestion  . Medication Refill    Needs refill on Valium.    HPI Patient is here for a sick visit She has been taking antibiotics for diverticulitis (Augmentin), finished last pill on October 22nd She goes back to see Dr. Dahlia Byes next Monday; she is going to have a colonoscopy; Dr. Dahlia Byes put her on another antibiotic (Augmentin), and had been on cipro and flagyl before the Augmentin  Third bout over the last year; mother is "healthy as a horse" She has had sore throat for five days, sx started before she ran out of antibiotics She has been using coricidin Throat is inflamed; feels like someone scratched it She has some productive cough and the mucous is light green Nasal congestion; hurts to touch over the right cheek; pressure over sinuses; no problems with upper teeth Left ear symptoms, like in a tunnel, popped when swallowing No rash No travel No fever, but felt flushed  She is a requesting refill of her Valium PMP Aware reviewed She has one left; has had it since May; just a fourth of a pill at times Did not start Cymbalta because of concerns with her abdomen Willing to start next week  Depression screen [facility] 2/9 02/13/2018 12/13/2017 11/04/2017 10/17/2017 08/16/2017  Decreased Interest 0 0 2 0 0  Down, Depressed, Hopeless 0 0 1 0 0  PHQ - 2 Score 0 0 3 0 0  Altered sleeping 0 - 0 - -    Tired, decreased energy 0 - 3 - -  Change in appetite 0 - 0 - -  Feeling bad or failure about yourself  0 - 0 - -  Trouble concentrating 0 - 0 - -  Moving slowly or fidgety/restless 0 - 0 - -  Suicidal thoughts 0 - 0 - -  PHQ-9 Score 0 - 6 - -  Difficult doing work/chores Not difficult at all - Not difficult at all - -   Fall Risk  05/15/2018 04/24/2018 04/17/2018 04/14/2018 02/13/2018  Falls in the past year? No No No No No    Relevant past medical, surgical, family and social history reviewed Past Medical History:  Diagnosis Date  . Beat, premature ventricular 06/21/2015  . Bundle branch block   . Diverticulitis of colon 07/07/2009  . Dry eyes   . Essential (primary) hypertension 06/21/2015  . Fibromyalgia   . GERD (gastroesophageal reflux disease)   . History of kidney stones   . Hyperlipidemia   . Hypertension   . IFG (impaired fasting glucose) 01/24/2015  . OAB (overactive bladder)   . Osteoarthritis   . Osteopenia determined by x-ray 02/10/2016  . Recurrent UTI   . Vertigo    Past Surgical History:  Procedure Laterality Date  . COLONOSCOPY WITH PROPOFOL N/A 11/11/2015   Procedure: COLONOSCOPY WITH PROPOFOL;  Surgeon: Lollie Sails, MD;  Location: [facility] ENDOSCOPY;  Service: Endoscopy;  Laterality: N/A;  . TOTAL ABDOMINAL HYSTERECTOMY  Jan 2012   Complete, due to heavy bleeding and grape-fruit size fibroid    Family History  Problem Relation Age of Onset  . Hypertension Mother   . Cancer Mother        skin  . Heart disease Father        CHF  . Hypertension Father   . Stroke Father        mini strokes  . Cancer Father        skin  . Heart attack Father   . Thyroid disease Father   . Ovarian cancer Maternal Grandmother 66  . Cancer Maternal Grandmother        ovarian/brain  . Heart attack Maternal Grandfather   . Heart attack Paternal Grandfather   . Diabetes Maternal Uncle   . Thyroid disease Sister   . Kidney disease Paternal Grandmother   . Breast  cancer Neg Hx    Social History   Tobacco Use  . Smoking status: Never Smoker  . Smokeless tobacco: Never Used  Substance Use Topics  . Alcohol use: No  . Drug use: No     [facility] Visit from 02/13/2018 in [facility]  AUDIT-C Score  0      Interim medical history since last visit reviewed. Allergies and medications reviewed  Review of Systems Per HPI unless specifically indicated above     Objective:    BP 120/70 (BP Location: Left Arm, Patient Position: Sitting, Cuff Size: Normal)   Pulse 95   Temp 98 F (36.7 C) (Oral)   Resp 16   Ht 5\' 4"  (1.626 m)   Wt 141 lb 6.4 oz (64.1 kg)   SpO2 97%   BMI 24.27 kg/m   Wt Readings from Last 3 Encounters:  05/15/18 141 lb 6.4 oz (64.1 kg)  05/07/18 142 lb 3.2 oz (64.5 kg)  04/24/18 139 lb 8 oz (63.3 kg)    Physical Exam  Constitutional: She appears well-developed and well-nourished.  HENT:  Right Ear: Tympanic membrane and ear canal normal.  Left Ear: Tympanic membrane and ear canal normal.  Nose: Rhinorrhea present.  Mouth/Throat: Mucous membranes are normal. No posterior oropharyngeal erythema.  Eyes: EOM are normal. No scleral icterus.  Cardiovascular: Normal rate and regular rhythm.  Pulmonary/Chest: Effort normal and breath sounds normal.  Psychiatric: She has a normal mood and affect. Her behavior is normal. Her mood appears not anxious. Her affect is not blunt and not inappropriate. She does not exhibit a depressed mood.    Results for orders placed or performed in visit on 05/15/18  Culture, Group A Strep  Result Value Ref Range   MICRO NUMBER: [id number]    SPECIMEN QUALITY: ADEQUATE    SOURCE: THROAT    STATUS: FINAL    RESULT: No group A Streptococcus isolated       Assessment & Plan:   Problem List Items Addressed This Visit      Other   Anxiety    Patient will start cymbalta next week; limited Rx for benzo; safety issues discussed with patient; PMP Aware reviewed prior to  prescribing      Relevant Medications   diazepam (VALIUM) 2 MG tablet    Other Visit Diagnoses    Pharyngitis, unspecified etiology    -  Primary   swab done, rapid performed in the office, to be charted; culture pending; treat symptomatically; call if not improving   Relevant Orders   Culture, Group A Strep (Completed)       Follow up plan: Return for follow-up with Benjamine Mola in 4 weeks; cancel Oct 31st appt.  An after-visit summary was printed and given to the patient at [facility].  Please see the patient instructions which may contain other information and recommendations beyond what is mentioned above in the assessment and plan.  Meds ordered this encounter  Medications  . benzonatate (TESSALON PERLES) 100 MG capsule    Sig: Take 1 capsule (100 mg total) by mouth every 8 (eight) hours as needed for cough.    Dispense:  30 capsule    Refill:  0  . diazepam (VALIUM) 2 MG tablet    Sig: Take 0.5 tablets (1 mg total) by mouth every 12 (twelve) hours as needed for anxiety.    Dispense:  15 tablet    Refill:  0    Orders Placed This Encounter  Procedures  . Culture, Group A Strep   Rapid strep was done but I had to cancel it in order to finish / sign off on my note Sending note to CMA to re-order the rapid strep and document results

## 2018-05-18 LAB — CULTURE, GROUP A STREP
MICRO NUMBER:: 91282048
SPECIMEN QUALITY:: ADEQUATE

## 2018-05-22 ENCOUNTER — Ambulatory Visit: Payer: 59 | Admitting: Nurse Practitioner

## 2018-05-25 NOTE — Assessment & Plan Note (Signed)
Patient will start cymbalta next week; limited Rx for benzo; safety issues discussed with patient; PMP Aware reviewed prior to prescribing

## 2018-05-26 ENCOUNTER — Other Ambulatory Visit: Payer: Self-pay

## 2018-05-26 ENCOUNTER — Telehealth: Payer: Self-pay | Admitting: *Deleted

## 2018-05-26 ENCOUNTER — Other Ambulatory Visit: Payer: Self-pay | Admitting: *Deleted

## 2018-05-26 ENCOUNTER — Encounter: Payer: Self-pay | Admitting: Surgery

## 2018-05-26 ENCOUNTER — Ambulatory Visit: Payer: 59 | Admitting: Surgery

## 2018-05-26 VITALS — BP 124/76 | HR 70 | Temp 98.1°F | Ht 63.0 in | Wt 141.0 lb

## 2018-05-26 DIAGNOSIS — K5732 Diverticulitis of large intestine without perforation or abscess without bleeding: Secondary | ICD-10-CM | POA: Diagnosis not present

## 2018-05-26 LAB — POCT RAPID STREP A (OFFICE): Rapid Strep A Screen: NEGATIVE

## 2018-05-26 NOTE — Telephone Encounter (Signed)
Patient has been scheduled for an appointment with Stephens November, NP (under Dr. Gustavo Lah) for an appointment to discuss a colonoscopy for 06-13-18 at 9:30 am. The patient is aware of date, time, and instructions.   The patient had previously had a colonoscopy completed by Dr. Gustavo Lah 2 1/2 years ago.  Patient has been instructed to call our office once colonoscopy is completed to schedule an appointment for follow up with Dr. Dahlia Byes.

## 2018-05-26 NOTE — Addendum Note (Signed)
Addended by: Sibyl Parr on: 05/26/2018 09:09 AM   Modules accepted: Orders

## 2018-05-26 NOTE — Patient Instructions (Addendum)
Patient to be scheduled for a colonoscopy with Lollie Sails, MD. Last colonoscopy was on 11/11/2015 . Follow up after colonoscopy.

## 2018-05-26 NOTE — Progress Notes (Signed)
Outpatient Surgical Follow Up  05/26/2018  Anela Bensman Beidler is an 57 y.o. female.   Chief Complaint  Patient presents with  . Follow-up    diverticulitis    HPI: Mrs. Kassa is following up after recent episode of acute diverticulitis requiring antibiotic therapy.  She has had a total of 4 episodes of diverticulitis and 3 of them have been over the last 9 months.  Currently she is feeling well without any pain, no fevers no chills.  She is able to eat well. She Did have a colonoscopy 2-1/2 years ago and I have personally reviewed the images.  I have also again reviewed the CT scan images showing asymmetry of the inflammation.  Given the findings of asymmetry in the CT scan I do recommend a repeat colonoscopy even though he has only been 2-1/2 years.  Past Medical History:  Diagnosis Date  . Beat, premature ventricular 06/21/2015  . Bundle branch block   . Diverticulitis of colon 07/07/2009  . Dry eyes   . Essential (primary) hypertension 06/21/2015  . Fibromyalgia   . GERD (gastroesophageal reflux disease)   . History of kidney stones   . Hyperlipidemia   . Hypertension   . IFG (impaired fasting glucose) 01/24/2015  . OAB (overactive bladder)   . Osteoarthritis   . Osteopenia determined by x-ray 02/10/2016  . Recurrent UTI   . Vertigo     Past Surgical History:  Procedure Laterality Date  . COLONOSCOPY WITH PROPOFOL N/A 11/11/2015   Procedure: COLONOSCOPY WITH PROPOFOL;  Surgeon: Lollie Sails, MD;  Location: [facility] ENDOSCOPY;  Service: Endoscopy;  Laterality: N/A;  . TOTAL ABDOMINAL HYSTERECTOMY  Jan 2012   Complete, due to heavy bleeding and grape-fruit size fibroid     Family History  Problem Relation Age of Onset  . Hypertension Mother   . Cancer Mother        skin  . Heart disease Father        CHF  . Hypertension Father   . Stroke Father        mini strokes  . Cancer Father        skin  . Heart attack Father   . Thyroid disease Father   . Ovarian cancer  Maternal Grandmother 66  . Cancer Maternal Grandmother        ovarian/brain  . Heart attack Maternal Grandfather   . Heart attack Paternal Grandfather   . Diabetes Maternal Uncle   . Thyroid disease Sister   . Kidney disease Paternal Grandmother   . Breast cancer Neg Hx     Social History:  reports that she has never smoked. She has never used smokeless tobacco. She reports that she does not drink alcohol or use drugs.  Allergies:  Allergies  Allergen Reactions  . Baclofen Other (See Comments)    Dizziness  . Codeine Other (See Comments)  . Hydroxyzine   . Sulfa Antibiotics Rash    Medications reviewed.    ROS Full ROS performed and is otherwise negative other than what is stated in HPI   BP 124/76   Pulse 70   Temp 98.1 F (36.7 C) (Skin)   Ht 5\' 3"  (1.6 m)   Wt 141 lb (64 kg)   BMI 24.98 kg/m   Physical Exam  Constitutional: She is oriented to person, place, and time. She appears well-developed and well-nourished.  Pulmonary/Chest: Effort normal. No respiratory distress.  Abdominal: Soft. She exhibits no distension. There is no tenderness. There is  no rebound and no guarding.  Neurological: She is alert and oriented to person, place, and time. No cranial nerve deficit.  Skin: Skin is warm and dry. Capillary refill takes less than 2 seconds.  Psychiatric: She has a normal mood and affect. Her behavior is normal. Judgment and thought content normal.  Nursing note and vitals reviewed.      Results for orders placed or performed in visit on 05/15/18 (from the past 48 hour(s))  POCT rapid strep A     Status: Normal   Collection Time: 05/26/18  9:09 AM  Result Value Ref Range   Rapid Strep A Screen Negative Negative   No results found.  Assessment/Plan:  57 year old female with recurrent diverticulitis total number of episodes are for and 3 within the last 9 months. Discussion with the patient and her husband about the natural course of diverticulitis.   Given the fact that she has had recurrent episodes and these have been 3 in less than a year she is definitely interested in pursuing a laparoscopic sigmoid colectomy. Discussed the evidence behind this and the significant improvement in quality of life.  She is also interested in avoided prolonged antibiotic therapy and avoiding any future or recurrent attacks. She Did have a colonoscopy 2-1/2 years ago and I have personally reviewed the images.  I have also again reviewed the CT scan images showing asymmetry of the inflammation.  Given the findings of asymmetry in the CT scan I do recommend a repeat colonoscopy even though he has only been 2-1/2 years.  We will send her back to Dr. Gustavo Lah for repeat colonoscopy and I will see her back in about 4-6 weeks.  Greater than 50% of the 25 minutes  visit was spent in counseling/coordination of care   Caroleen Hamman, MD [place] Surgeon

## 2018-06-02 DIAGNOSIS — M9901 Segmental and somatic dysfunction of cervical region: Secondary | ICD-10-CM | POA: Diagnosis not present

## 2018-06-02 DIAGNOSIS — M531 Cervicobrachial syndrome: Secondary | ICD-10-CM | POA: Diagnosis not present

## 2018-06-02 DIAGNOSIS — M542 Cervicalgia: Secondary | ICD-10-CM | POA: Diagnosis not present

## 2018-06-04 DIAGNOSIS — M9901 Segmental and somatic dysfunction of cervical region: Secondary | ICD-10-CM | POA: Diagnosis not present

## 2018-06-04 DIAGNOSIS — M531 Cervicobrachial syndrome: Secondary | ICD-10-CM | POA: Diagnosis not present

## 2018-06-04 DIAGNOSIS — M542 Cervicalgia: Secondary | ICD-10-CM | POA: Diagnosis not present

## 2018-06-09 ENCOUNTER — Telehealth: Payer: Self-pay

## 2018-06-09 NOTE — Telephone Encounter (Signed)
Called patient in regards to her Duloxetine. She has not started taking it because of other issues she was having. She still has it and will start taking it tomorrow.

## 2018-06-10 DIAGNOSIS — M9901 Segmental and somatic dysfunction of cervical region: Secondary | ICD-10-CM | POA: Diagnosis not present

## 2018-06-10 DIAGNOSIS — D2261 Melanocytic nevi of right upper limb, including shoulder: Secondary | ICD-10-CM | POA: Diagnosis not present

## 2018-06-10 DIAGNOSIS — X32XXXA Exposure to sunlight, initial encounter: Secondary | ICD-10-CM | POA: Diagnosis not present

## 2018-06-10 DIAGNOSIS — D2262 Melanocytic nevi of left upper limb, including shoulder: Secondary | ICD-10-CM | POA: Diagnosis not present

## 2018-06-10 DIAGNOSIS — M542 Cervicalgia: Secondary | ICD-10-CM | POA: Diagnosis not present

## 2018-06-10 DIAGNOSIS — L570 Actinic keratosis: Secondary | ICD-10-CM | POA: Diagnosis not present

## 2018-06-10 DIAGNOSIS — D225 Melanocytic nevi of trunk: Secondary | ICD-10-CM | POA: Diagnosis not present

## 2018-06-10 DIAGNOSIS — M531 Cervicobrachial syndrome: Secondary | ICD-10-CM | POA: Diagnosis not present

## 2018-06-12 ENCOUNTER — Ambulatory Visit: Payer: 59 | Admitting: Nurse Practitioner

## 2018-06-12 DIAGNOSIS — M9901 Segmental and somatic dysfunction of cervical region: Secondary | ICD-10-CM | POA: Diagnosis not present

## 2018-06-12 DIAGNOSIS — M542 Cervicalgia: Secondary | ICD-10-CM | POA: Diagnosis not present

## 2018-06-12 DIAGNOSIS — M531 Cervicobrachial syndrome: Secondary | ICD-10-CM | POA: Diagnosis not present

## 2018-06-13 DIAGNOSIS — R933 Abnormal findings on diagnostic imaging of other parts of digestive tract: Secondary | ICD-10-CM | POA: Diagnosis not present

## 2018-06-13 DIAGNOSIS — K5792 Diverticulitis of intestine, part unspecified, without perforation or abscess without bleeding: Secondary | ICD-10-CM | POA: Diagnosis not present

## 2018-06-13 DIAGNOSIS — Z86010 Personal history of colonic polyps: Secondary | ICD-10-CM | POA: Diagnosis not present

## 2018-06-16 ENCOUNTER — Telehealth: Payer: Self-pay | Admitting: *Deleted

## 2018-06-16 NOTE — Telephone Encounter (Signed)
Message left at patient's work to call the office.   Patient needs to be scheduled for a follow up appointment with Dr. Dahlia Byes after colonoscopy.  It looks like patient is scheduled for a colonoscopy with Dr. Gustavo Lah for 07-31-2018.

## 2018-06-16 NOTE — Telephone Encounter (Signed)
Patient called the office back to report that Dr. Marton Redwood office called and said they had a cancellation and colonoscopy was moved up to 06-24-18.   Patient has been scheduled for a follow up appointment with Dr. Dahlia Byes for 06-30-18 at 9:15 am. The patient verbalizes understanding.

## 2018-06-17 ENCOUNTER — Other Ambulatory Visit: Payer: Self-pay | Admitting: Nurse Practitioner

## 2018-06-17 NOTE — Telephone Encounter (Signed)
Patient is calling in wanting to know if she can stop the med due to they way it is making her feel. She states she feels as if she is going to crawl out of her skin.

## 2018-06-17 NOTE — Telephone Encounter (Signed)
If she is asking to stop her duloxetine, that is okay. If she is taking cymbalta 20mg  capsule she can stop taking it now, if she went up to 40mg  daily I would like her to go down to 20mg  daily for 3 days and then stop.  If she would like to try something different for anxiety please schedule an appointment.

## 2018-06-17 NOTE — Telephone Encounter (Signed)
She thinks that the Duloxetine is not working for her. She has had to take Valium two days in a row to help with her anxiety. When she normally only takes it once a month or so. She started taking the Duloxetine on Nov. 7 after she was called about non-compliance of this medication. She said that this medication was discontinued by another provider. She will stop taking this medication until she follow up on 06/26/2018

## 2018-06-17 NOTE — Telephone Encounter (Signed)
Please contact patient Whitney Cooper has metoprolol succinate 25 mg in her med list and that was refilled by Suezanne Cheshire in October We are now getting a refill request for the 50 mg strength

## 2018-06-18 NOTE — Telephone Encounter (Signed)
Patient states it was a mix up she is taking 25mg 

## 2018-06-18 NOTE — Telephone Encounter (Signed)
Left detailed voicemail

## 2018-06-18 NOTE — Telephone Encounter (Signed)
Thank you She should have enough 25 mg toprol xl until early January

## 2018-06-23 ENCOUNTER — Encounter: Payer: Self-pay | Admitting: *Deleted

## 2018-06-24 ENCOUNTER — Encounter
Admission: RE | Disposition: A | Discharge status: Home / Self Care | Payer: Self-pay | Source: Ambulatory Visit | Attending: Gastroenterology

## 2018-06-24 ENCOUNTER — Ambulatory Visit
Admission: RE | Admit: 2018-06-24 | Discharge: 2018-06-24 | Disposition: A | Discharge status: Home / Self Care | Payer: 59 | Source: Ambulatory Visit | Attending: Gastroenterology | Admitting: Gastroenterology

## 2018-06-24 ENCOUNTER — Ambulatory Visit: Payer: 59 | Admitting: Anesthesiology

## 2018-06-24 ENCOUNTER — Encounter: Payer: Self-pay | Admitting: Anesthesiology

## 2018-06-24 DIAGNOSIS — Z882 Allergy status to sulfonamides status: Secondary | ICD-10-CM | POA: Diagnosis not present

## 2018-06-24 DIAGNOSIS — I10 Essential (primary) hypertension: Secondary | ICD-10-CM | POA: Diagnosis not present

## 2018-06-24 DIAGNOSIS — M797 Fibromyalgia: Secondary | ICD-10-CM | POA: Diagnosis not present

## 2018-06-24 DIAGNOSIS — K5732 Diverticulitis of large intestine without perforation or abscess without bleeding: Secondary | ICD-10-CM | POA: Diagnosis not present

## 2018-06-24 DIAGNOSIS — R948 Abnormal results of function studies of other organs and systems: Secondary | ICD-10-CM | POA: Diagnosis present

## 2018-06-24 DIAGNOSIS — K219 Gastro-esophageal reflux disease without esophagitis: Secondary | ICD-10-CM | POA: Diagnosis not present

## 2018-06-24 DIAGNOSIS — K5730 Diverticulosis of large intestine without perforation or abscess without bleeding: Secondary | ICD-10-CM | POA: Diagnosis not present

## 2018-06-24 DIAGNOSIS — Z1211 Encounter for screening for malignant neoplasm of colon: Secondary | ICD-10-CM | POA: Diagnosis not present

## 2018-06-24 DIAGNOSIS — E785 Hyperlipidemia, unspecified: Secondary | ICD-10-CM | POA: Diagnosis not present

## 2018-06-24 DIAGNOSIS — M1990 Unspecified osteoarthritis, unspecified site: Secondary | ICD-10-CM | POA: Insufficient documentation

## 2018-06-24 DIAGNOSIS — M8580 Other specified disorders of bone density and structure, unspecified site: Secondary | ICD-10-CM | POA: Insufficient documentation

## 2018-06-24 DIAGNOSIS — Z791 Long term (current) use of non-steroidal anti-inflammatories (NSAID): Secondary | ICD-10-CM | POA: Diagnosis not present

## 2018-06-24 DIAGNOSIS — R933 Abnormal findings on diagnostic imaging of other parts of digestive tract: Secondary | ICD-10-CM | POA: Diagnosis not present

## 2018-06-24 DIAGNOSIS — K5790 Diverticulosis of intestine, part unspecified, without perforation or abscess without bleeding: Secondary | ICD-10-CM | POA: Diagnosis not present

## 2018-06-24 DIAGNOSIS — R7301 Impaired fasting glucose: Secondary | ICD-10-CM | POA: Insufficient documentation

## 2018-06-24 DIAGNOSIS — K5792 Diverticulitis of intestine, part unspecified, without perforation or abscess without bleeding: Secondary | ICD-10-CM | POA: Diagnosis not present

## 2018-06-24 HISTORY — PX: COLONOSCOPY WITH PROPOFOL: SHX5780

## 2018-06-24 HISTORY — DX: Personal history of other infectious and parasitic diseases: Z86.19

## 2018-06-24 HISTORY — DX: Headache, unspecified: R51.9

## 2018-06-24 HISTORY — DX: Headache: R51

## 2018-06-24 HISTORY — DX: Unspecified urinary incontinence: R32

## 2018-06-24 SURGERY — COLONOSCOPY WITH PROPOFOL
Anesthesia: General

## 2018-06-24 MED ORDER — FENTANYL CITRATE (PF) 100 MCG/2ML IJ SOLN
INTRAMUSCULAR | Status: AC
  Filled 2018-06-24: qty 2

## 2018-06-24 MED ORDER — FENTANYL CITRATE (PF) 100 MCG/2ML IJ SOLN
INTRAMUSCULAR | Status: DC | PRN
  Administered 2018-06-24: 50 ug via INTRAVENOUS

## 2018-06-24 MED ORDER — PROPOFOL 500 MG/50ML IV EMUL
INTRAVENOUS | Status: AC
  Filled 2018-06-24: qty 50

## 2018-06-24 MED ORDER — SODIUM CHLORIDE 0.9 % IV SOLN
INTRAVENOUS | Status: DC | PRN
  Administered 2018-06-24: 08:00:00 via INTRAVENOUS

## 2018-06-24 MED ORDER — MIDAZOLAM HCL 2 MG/2ML IJ SOLN
INTRAMUSCULAR | Status: DC | PRN
  Administered 2018-06-24: 2 mg via INTRAVENOUS

## 2018-06-24 MED ORDER — SODIUM CHLORIDE 0.9 % IV SOLN
INTRAVENOUS | Status: DC
  Administered 2018-06-24: 1000 mL via INTRAVENOUS

## 2018-06-24 MED ORDER — PROPOFOL 500 MG/50ML IV EMUL
INTRAVENOUS | Status: DC | PRN
  Administered 2018-06-24: 100 ug/kg/min via INTRAVENOUS

## 2018-06-24 MED ORDER — MIDAZOLAM HCL 2 MG/2ML IJ SOLN
INTRAMUSCULAR | Status: AC
  Filled 2018-06-24: qty 2

## 2018-06-24 NOTE — Anesthesia Postprocedure Evaluation (Signed)
Anesthesia Post Note  Patient: Whitney Cooper  Procedure(s) Performed: COLONOSCOPY WITH PROPOFOL (N/A )  Patient location during evaluation: Endoscopy Anesthesia Type: General Level of consciousness: awake and alert Pain management: pain level controlled Vital Signs Assessment: post-procedure vital signs reviewed and stable Respiratory status: spontaneous breathing, nonlabored ventilation, respiratory function stable and patient connected to nasal cannula oxygen Cardiovascular status: blood pressure returned to baseline and stable Postop Assessment: no apparent nausea or vomiting Anesthetic complications: no     Last Vitals:  Vitals:   06/24/18 0815 06/24/18 0825  BP: 109/67 120/71  Pulse:    Resp:    Temp:    SpO2:      Last Pain:  Vitals:   06/24/18 0835  TempSrc:   PainSc: 0-No pain                 Martha Clan

## 2018-06-24 NOTE — Transfer of Care (Signed)
Immediate Anesthesia Transfer of Care Note  Patient: Whitney Cooper  Procedure(s) Performed: COLONOSCOPY WITH PROPOFOL (N/A )  Patient Location: PACU  Anesthesia Type:General  Level of Consciousness: awake and sedated  Airway & Oxygen Therapy: Patient Spontanous Breathing and Patient connected to nasal cannula oxygen  Post-op Assessment: Report given to RN and Post -op Vital signs reviewed and stable  Post vital signs: Reviewed and stable  Last Vitals:  Vitals Value Taken Time  BP    Temp    Pulse    Resp    SpO2      Last Pain:  Vitals:   06/24/18 0708  TempSrc: Tympanic  PainSc: 0-No pain         Complications: No apparent anesthesia complications

## 2018-06-24 NOTE — Anesthesia Preprocedure Evaluation (Signed)
Anesthesia Evaluation  Patient identified by MRN, date of birth, ID band Patient awake    Reviewed: Allergy & Precautions, NPO status , Patient's Chart, lab work & pertinent test results, reviewed documented beta blocker date and time   History of Anesthesia Complications Negative for: history of anesthetic complications  Airway Mallampati: I  TM Distance: >3 FB Neck ROM: Full    Dental  (+) Chipped, Implants, Dental Advidsory Given, Missing, Teeth Intact   Pulmonary neg pulmonary ROS,           Cardiovascular Exercise Tolerance: Good hypertension, Pt. on medications and Pt. on home beta blockers (-) angina(-) CAD, (-) Past MI, (-) Cardiac Stents and (-) CABG + dysrhythmias (-) Valvular Problems/Murmurs  BBB with occasional PVCs   Neuro/Psych neg Seizures PSYCHIATRIC DISORDERS Anxiety  Neuromuscular disease (fibromyalgia)    GI/Hepatic Neg liver ROS, GERD  Medicated and Controlled,  Endo/Other  negative endocrine ROS  Renal/GU negative Renal ROS Bladder dysfunction      Musculoskeletal  (+) Arthritis , Osteoarthritis,  Fibromyalgia -  Abdominal Normal abdominal exam  (+)   Peds negative pediatric ROS (+)  Hematology negative hematology ROS (+)   Anesthesia Other Findings Past Medical History: 06/21/2015: Beat, premature ventricular No date: Bundle branch block 07/07/2009: Diverticulitis of colon No date: Dry eyes 06/21/2015: Essential (primary) hypertension No date: Fibromyalgia No date: GERD (gastroesophageal reflux disease) No date: Headache     Comment:  migraine No date: History of chicken pox No date: History of kidney stones No date: Hyperlipidemia No date: Hypertension 01/24/2015: IFG (impaired fasting glucose) No date: OAB (overactive bladder) No date: Osteoarthritis 02/10/2016: Osteopenia determined by x-ray No date: Recurrent UTI No date: Urinary incontinence No date: Vertigo   Reproductive/Obstetrics                             Anesthesia Physical  Anesthesia Plan  ASA: III  Anesthesia Plan: General   Post-op Pain Management:    Induction: Intravenous  PONV Risk Score and Plan: 3 and Propofol infusion and TIVA  Airway Management Planned: Nasal Cannula  Additional Equipment:   Intra-op Plan:   Post-operative Plan:   Informed Consent: I have reviewed the patients History and Physical, chart, labs and discussed the procedure including the risks, benefits and alternatives for the proposed anesthesia with the patient or authorized representative who has indicated his/her understanding and acceptance.   Dental advisory given  Plan Discussed with: CRNA and Surgeon  Anesthesia Plan Comments:         Anesthesia Quick Evaluation

## 2018-06-24 NOTE — H&P (Signed)
Outpatient short stay form Pre-procedure 06/24/2018 7:18 AM Lollie Sails MD  Primary Physician: Dr. Enid Derry  Reason for visit: Colonoscopy  History of present illness: Patient is a 57 year old female presenting today for colonoscopy.  There is some concern to her recurrent diverticulitis that she may have developed a possible colo-vesicle fistula and that she had a finding of air in her bladder.  Also some asymmetric changes in the sigmoid colon.  A CT scan did not confirm a colovesical fistula.  We are proceeding with colonoscopy at the today for further evaluation in this regard.  She tolerated her prep well.  She takes no aspirin or blood thinning agent.    Current Facility-Administered Medications:  .  0.9 %  sodium chloride infusion, , Intravenous, Continuous, Lollie Sails, MD  Medications Prior to Admission  Medication Sig Dispense Refill Last Dose  . atorvastatin (LIPITOR) 20 MG tablet Take 1 tablet (20 mg total) by mouth every other day. 30 tablet 5 Past Week at Unknown time  . Cholecalciferol (VITAMIN D3) 1000 units CAPS Take 1 capsule (1,000 Units total) by mouth daily.   Past Week at Unknown time  . Coenzyme Q10 (CO Q-10) 100 MG CAPS Take by mouth daily.    Past Week at Unknown time  . diazepam (VALIUM) 2 MG tablet Take 0.5 tablets (1 mg total) by mouth every 12 (twelve) hours as needed for anxiety. 15 tablet 0 Past Week at Unknown time  . esomeprazole (NEXIUM) 20 MG capsule Take 1 capsule (20 mg total) by mouth as needed. 30 capsule 5 06/23/2018 at Unknown time  . metoprolol succinate (TOPROL-XL) 25 MG 24 hr tablet Take 1 tablet (25 mg total) by mouth daily. 30 tablet 2 06/24/2018 at 0500  . Multiple Vitamin (MULTI-VITAMINS) TABS Take 1 tablet by mouth daily.    Past Week at Unknown time  . Omega-3 Fatty Acids (FISH OIL) 1000 MG CAPS Take 1 capsule by mouth daily.   Past Week at Unknown time  . ondansetron (ZOFRAN-ODT) 4 MG disintegrating tablet Take 4 mg by mouth  every 8 (eight) hours as needed. for nausea  0 Past Month at Unknown time  . benzonatate (TESSALON PERLES) 100 MG capsule Take 1 capsule (100 mg total) by mouth every 8 (eight) hours as needed for cough. 30 capsule 0  at prn  . DULoxetine (CYMBALTA) 20 MG capsule Take 20 mg by mouth daily.   Not Taking at Unknown time  . ibuprofen (ADVIL,MOTRIN) 200 MG tablet Take 200 mg by mouth every 6 (six) hours as needed.    at prn  . meclizine (ANTIVERT) 25 MG tablet Take 25 mg by mouth as needed.    at prn  . Melatonin 5 MG CAPS Take 5 mg by mouth at bedtime as needed.   Taking  . naproxen sodium (ALEVE) 220 MG tablet Take 220 mg by mouth daily as needed.    at prn  . SUMAtriptan (IMITREX) 100 MG tablet Take 100 mg by mouth as needed.    at prn     Allergies  Allergen Reactions  . Baclofen Other (See Comments)    Dizziness  . Hydrocodone Other (See Comments)    Headache,dizziness  . Codeine Other (See Comments)  . Hydroxyzine   . Sulfa Antibiotics Rash     Past Medical History:  Diagnosis Date  . Beat, premature ventricular 06/21/2015  . Bundle branch block   . Diverticulitis of colon 07/07/2009  . Dry eyes   .  Essential (primary) hypertension 06/21/2015  . Fibromyalgia   . GERD (gastroesophageal reflux disease)   . Headache    migraine  . History of chicken pox   . History of kidney stones   . Hyperlipidemia   . Hypertension   . IFG (impaired fasting glucose) 01/24/2015  . OAB (overactive bladder)   . Osteoarthritis   . Osteopenia determined by x-ray 02/10/2016  . Recurrent UTI   . Urinary incontinence   . Vertigo     Review of systems:      Physical Exam    Heart and lungs: Regular rate and rhythm without rub or gallop, lungs are bilaterally clear.    HEENT: Normocephalic atraumatic eyes are anicteric    Other:    Pertinant exam for procedure: Soft nontender nondistended bowel sounds are positive normoactive.    Planned proceedures: Colonoscopy and indicated  procedures. I have discussed the risks benefits and complications of procedures to include not limited to bleeding, infection, perforation and the risk of sedation and the patient wishes to proceed.    Lollie Sails, MD Gastroenterology 06/24/2018  7:18 AM

## 2018-06-24 NOTE — Anesthesia Procedure Notes (Signed)
Performed by: Cook-Martin, Doratha Mcswain Pre-anesthesia Checklist: Patient identified, Emergency Drugs available, Suction available, Patient being monitored and Timeout performed Patient Re-evaluated:Patient Re-evaluated prior to induction Oxygen Delivery Method: Nasal cannula Preoxygenation: Pre-oxygenation with 100% oxygen Induction Type: IV induction Placement Confirmation: CO2 detector and positive ETCO2       

## 2018-06-24 NOTE — Op Note (Signed)
[facility]astroenterology Patient Name: Whitney Cooper Procedure Date: 06/24/2018 7:12 AM MRN: [id number] Account #: [id number] Date of Birth: May 04, 1961 Admit Type: Outpatient Age: 57 Room: [facility] ENDO ROOM 3 Gender: Female Note Status: Finalized Procedure:            Colonoscopy Indications:          Abnormal CT of the GI tract, Follow-up of diverticulitis Providers:            Lollie Sails, MD Referring MD:         Arnetha Courser (Referring MD) Medicines:            Monitored Anesthesia Care Complications:        No immediate complications. Procedure:            Pre-Anesthesia Assessment:                       - ASA Grade Assessment: III - A patient with severe                        systemic disease.                       After obtaining informed consent, the colonoscope was                        passed under direct vision. Throughout the procedure,                        the patient's blood pressure, pulse, and oxygen                        saturations were monitored continuously. The                        Colonoscope was introduced through the anus and                        advanced to the the cecum, identified by appendiceal                        orifice and ileocecal valve. The colonoscopy was                        performed without difficulty. The patient tolerated the                        procedure well. The quality of the bowel preparation                        was good. Findings:      Multiple small-mouthed diverticula were found in the sigmoid colon,       descending colon and transverse colon.      The exam was otherwise normal throughout the examined colon.      The retroflexed view of the distal rectum and anal verge was normal and       showed no anal or rectal abnormalities. Impression:           - Diverticulosis in the sigmoid colon, in the  descending colon and in the transverse colon.  - The distal rectum and anal verge are normal on                        retroflexion view.                       - No specimens collected. Recommendation:       - Discharge patient to home. Procedure Code(s):    --- Professional ---                       [phone], Colonoscopy, flexible; diagnostic, including                        collection of specimen(s) by brushing or washing, when                        performed (separate procedure) Diagnosis Code(s):    --- Professional ---                       U04.54, Diverticulitis of large intestine without                        perforation or abscess without bleeding                       K57.30, Diverticulosis of large intestine without                        perforation or abscess without bleeding                       R93.3, Abnormal findings on diagnostic imaging of other                        parts of digestive tract CPT copyright 2018 [employer]. All rights reserved. The codes documented in this report are preliminary and upon coder review may  be revised to meet current compliance requirements. Lollie Sails, MD 06/24/2018 8:06:17 AM This report has been signed electronically. Number of Addenda: 0 Note Initiated On: 06/24/2018 7:12 AM Scope Withdrawal Time: 0 hours 7 minutes 35 seconds  Total Procedure Duration: 0 hours 13 minutes 37 seconds       [facility]

## 2018-06-24 NOTE — Anesthesia Post-op Follow-up Note (Signed)
Anesthesia QCDR form completed.        

## 2018-06-25 ENCOUNTER — Encounter: Payer: Self-pay | Admitting: Gastroenterology

## 2018-06-25 NOTE — Progress Notes (Signed)
Name: Whitney Cooper   MRN: [id number]    DOB: 20-May-1961   Date:06/26/2018       Progress Note  Subjective  Chief Complaint  Chief Complaint  Patient presents with  . Fatigue    HPI  Patient presents for follow up on depression and anxiety was rx Cymbalta a few months ago but did not start it until a few weeks ago. She took it for two day and noted that it made feel more on edge and felt she had less patience. She rarely takes her valium and had to take it for the two days she was on Cymbalta so she stopped taking the medication and has felt back to her baseline before Cymbalta. States after her colonoscopy results came back her stress significantly decreased. Work stress has decreased as well, as they have hired [employer]. She does still have come overall anxiety- talks to her brother and church members about it. She is notes feeling fatigued and tired still and feels it is progressing, had abnormal TSH levels previously and would like it rechecked.   GAD 7 : Generalized Anxiety Score 06/26/2018 04/24/2018 11/04/2017  Nervous, Anxious, on Edge 1 2 3   Control/stop worrying 2 3 3   Worry too much - different things 2 3 3   Trouble relaxing 1 3 3   Restless 1 3 3   Easily annoyed or irritable 1 2 2   Afraid - awful might happen 2 2 1   Total GAD 7 Score 10 18 18   Anxiety Difficulty Somewhat difficult Somewhat difficult Somewhat difficult   Depression screen [facility] 2/9 06/26/2018 02/13/2018 12/13/2017  Decreased Interest 1 0 0  Down, Depressed, Hopeless 0 0 0  PHQ - 2 Score 1 0 0  Altered sleeping 1 0 -  Tired, decreased energy 2 0 -  Change in appetite 1 0 -  Feeling bad or failure about yourself  1 0 -  Trouble concentrating 0 0 -  Moving slowly or fidgety/restless 0 0 -  Suicidal thoughts 0 0 -  PHQ-9 Score 6 0 -  Difficult doing work/chores Somewhat difficult Not difficult at all -     Patient Active Problem List   Diagnosis Date Noted  . Aortic atherosclerosis ([place]) 04/15/2018  .  Preventative health care 02/12/2018  . Anxiety 12/13/2017  . Insomnia 04/10/2016  . Upper back pain, chronic 02/10/2016  . Neck pain, chronic 02/10/2016  . Medication monitoring encounter 11/15/2015  . Essential (primary) hypertension 06/21/2015  . Combined fat and carbohydrate induced hyperlipemia 06/21/2015  . Beat, premature ventricular 06/21/2015  . Urinary frequency 03/15/2015  . [place] 03/15/2015  . Incontinence 03/15/2015  . Atrophic vaginitis 03/15/2015  . Abnormal thyroid function test 01/24/2015  . IFG (impaired fasting glucose) 01/24/2015  . Overweight (BMI 25.0-29.9) 01/19/2015  . Urinary retention with incomplete bladder emptying 01/19/2015  . Hyperlipidemia   . Osteoarthritis   . GERD (gastroesophageal reflux disease)   . Fibromyalgia   . Diverticulitis of colon 07/07/2009    Past Medical History:  Diagnosis Date  . Beat, premature ventricular 06/21/2015  . Bundle branch block   . Diverticulitis of colon 07/07/2009  . Dry eyes   . Essential (primary) hypertension 06/21/2015  . Fibromyalgia   . GERD (gastroesophageal reflux disease)   . Headache    migraine  . History of chicken pox   . History of kidney stones   . Hyperlipidemia   . Hypertension   . IFG (impaired fasting glucose) 01/24/2015  . OAB (overactive bladder)   .  Osteoarthritis   . Osteopenia determined by x-ray 02/10/2016  . Recurrent UTI   . Urinary incontinence   . Vertigo     Past Surgical History:  Procedure Laterality Date  . COLONOSCOPY WITH PROPOFOL N/A 11/11/2015   Procedure: COLONOSCOPY WITH PROPOFOL;  Surgeon: Lollie Sails, MD;  Location: [facility] ENDOSCOPY;  Service: Endoscopy;  Laterality: N/A;  . COLONOSCOPY WITH PROPOFOL N/A 06/24/2018   Procedure: COLONOSCOPY WITH PROPOFOL;  Surgeon: Lollie Sails, MD;  Location: [facility] ENDOSCOPY;  Service: Endoscopy;  Laterality: N/A;  . ESOPHAGOGASTRODUODENOSCOPY  11/13/2013  . OOPHORECTOMY    . TOTAL ABDOMINAL HYSTERECTOMY  Jan 2012    Complete, due to heavy bleeding and grape-fruit size fibroid     Social History   Tobacco Use  . Smoking status: Never Smoker  . Smokeless tobacco: Never Used  Substance Use Topics  . Alcohol use: No     Current Outpatient Medications:  .  atorvastatin (LIPITOR) 20 MG tablet, Take 1 tablet (20 mg total) by mouth every other day., Disp: 30 tablet, Rfl: 5 .  Cholecalciferol (VITAMIN D3) 1000 units CAPS, Take 1 capsule (1,000 Units total) by mouth daily., Disp: , Rfl:  .  Coenzyme Q10 (CO Q-10) 100 MG CAPS, Take by mouth daily. , Disp: , Rfl:  .  diazepam (VALIUM) 2 MG tablet, Take 0.5 tablets (1 mg total) by mouth every 12 (twelve) hours as needed for anxiety., Disp: 15 tablet, Rfl: 0 .  esomeprazole (NEXIUM) 20 MG capsule, Take 1 capsule (20 mg total) by mouth as needed. (Patient taking differently: Take 40 mg by mouth as needed. ), Disp: 30 capsule, Rfl: 5 .  ibuprofen (ADVIL,MOTRIN) 200 MG tablet, Take 200 mg by mouth every 6 (six) hours as needed., Disp: , Rfl:  .  loratadine (CLARITIN) 10 MG tablet, Take 10 mg by mouth daily., Disp: , Rfl:  .  meclizine (ANTIVERT) 25 MG tablet, Take 25 mg by mouth as needed., Disp: , Rfl:  .  Melatonin 5 MG CAPS, Take 5 mg by mouth at bedtime as needed., Disp: , Rfl:  .  metoprolol succinate (TOPROL-XL) 25 MG 24 hr tablet, Take 1 tablet (25 mg total) by mouth daily., Disp: 30 tablet, Rfl: 2 .  Multiple Vitamin (MULTI-VITAMINS) TABS, Take 1 tablet by mouth daily. , Disp: , Rfl:  .  naproxen sodium (ALEVE) 220 MG tablet, Take 220 mg by mouth daily as needed., Disp: , Rfl:  .  Omega-3 Fatty Acids (FISH OIL) 1000 MG CAPS, Take 1 capsule by mouth daily., Disp: , Rfl:  .  ondansetron (ZOFRAN-ODT) 4 MG disintegrating tablet, Take 4 mg by mouth every 8 (eight) hours as needed. for nausea, Disp: , Rfl: 0 .  SUMAtriptan (IMITREX) 100 MG tablet, Take 100 mg by mouth as needed., Disp: , Rfl:  .  benzonatate (TESSALON PERLES) 100 MG capsule, Take 1 capsule (100  mg total) by mouth every 8 (eight) hours as needed for cough. (Patient not taking: Reported on 06/26/2018), Disp: 30 capsule, Rfl: 0 .  DULoxetine (CYMBALTA) 20 MG capsule, Take 20 mg by mouth daily., Disp: , Rfl:   Allergies  Allergen Reactions  . Baclofen Other (See Comments)    Dizziness  . Hydrocodone Other (See Comments)    Headache,dizziness  . Codeine Other (See Comments)  . Hydroxyzine   . Sulfa Antibiotics Rash    ROS   No other specific complaints in a complete review of systems (except as listed in HPI above).  Objective  Vitals:   06/26/18 0901  BP: 128/90  Pulse: 90  Resp: 16  Temp: 97.8 F (36.6 C)  TempSrc: Oral  SpO2: 99%  Weight: 141 lb (64 kg)  Height: 5\' 4"  (1.626 m)     Body mass index is 24.2 kg/m.  Nursing Note and Vital Signs reviewed.  Physical Exam  Constitutional: She is oriented to person, place, and time. She appears well-developed and well-nourished. She is cooperative.  HENT:  Head: Normocephalic and atraumatic.  Right Ear: Hearing normal.  Left Ear: Hearing normal.  Mouth/Throat: Mucous membranes are normal.  Eyes: Conjunctivae are normal.  Cardiovascular: Normal rate, regular rhythm and normal heart sounds.  Pulmonary/Chest: Effort normal and breath sounds normal.  Abdominal: Normal appearance.  Musculoskeletal: Normal range of motion.  Neurological: She is alert and oriented to person, place, and time.  Psychiatric: She has a normal mood and affect. Her speech is normal and behavior is normal. Judgment and thought content normal.      No results found for this or any previous visit (from the past 48 hour(s)).  Assessment & Plan  1. GAD (generalized anxiety disorder) Counseling; improved   2. Flu vaccine need  - Flu Vaccine QUAD 36+ mos IM  3. Fatigue, unspecified type Diet, hydration  - TSH  4. Abnormal thyroid function test - TSH

## 2018-06-26 ENCOUNTER — Encounter: Payer: Self-pay | Admitting: Nurse Practitioner

## 2018-06-26 ENCOUNTER — Ambulatory Visit (INDEPENDENT_AMBULATORY_CARE_PROVIDER_SITE_OTHER): Payer: 59 | Admitting: Nurse Practitioner

## 2018-06-26 VITALS — BP 128/90 | HR 90 | Temp 97.8°F | Resp 16 | Ht 64.0 in | Wt 141.0 lb

## 2018-06-26 DIAGNOSIS — F411 Generalized anxiety disorder: Secondary | ICD-10-CM

## 2018-06-26 DIAGNOSIS — R5383 Other fatigue: Secondary | ICD-10-CM | POA: Diagnosis not present

## 2018-06-26 DIAGNOSIS — Z23 Encounter for immunization: Secondary | ICD-10-CM | POA: Diagnosis not present

## 2018-06-26 DIAGNOSIS — R946 Abnormal results of thyroid function studies: Secondary | ICD-10-CM

## 2018-06-30 ENCOUNTER — Encounter: Payer: Self-pay | Admitting: Surgery

## 2018-06-30 ENCOUNTER — Other Ambulatory Visit: Payer: Self-pay

## 2018-06-30 ENCOUNTER — Ambulatory Visit: Payer: 59 | Admitting: Surgery

## 2018-06-30 ENCOUNTER — Encounter: Payer: Self-pay | Admitting: *Deleted

## 2018-06-30 VITALS — BP 129/78 | HR 98 | Temp 97.5°F | Resp 24 | Ht 64.0 in | Wt 142.0 lb

## 2018-06-30 DIAGNOSIS — K5732 Diverticulitis of large intestine without perforation or abscess without bleeding: Secondary | ICD-10-CM | POA: Diagnosis not present

## 2018-06-30 NOTE — Patient Instructions (Signed)
Patient is to be scheduled for surgery with Dr.Pabon. She will call the office when she is ready.  Call the office with any questions or concerns.

## 2018-06-30 NOTE — Progress Notes (Signed)
Outpatient Surgical Follow Up  06/30/2018  Whitney Cooper is an 57 y.o. female.   Chief Complaint  Patient presents with  . Follow-up     f/u diverticulitis; pt to have colonoscopy with dr Gustavo Lah on 06-24-18    HPI: Whitney Cooper following up for recurrent diverticulitis.  She completed a colonoscopy that I have personally review showing no evidence of cancer no evidence of suspicious masses.  She has had no more episode of diverticulitis but she does have occasional left flank pain.  No nausea no vomiting no fevers no chills no weight loss.  She is able to perform more than 4 METS of activity without any shortness with or chest pain.  Past Medical History:  Diagnosis Date  . Beat, premature ventricular 06/21/2015  . Bundle branch block   . Diverticulitis of colon 07/07/2009  . Dry eyes   . Essential (primary) hypertension 06/21/2015  . Fibromyalgia   . GERD (gastroesophageal reflux disease)   . Headache    migraine  . History of chicken pox   . History of kidney stones   . Hyperlipidemia   . Hypertension   . IFG (impaired fasting glucose) 01/24/2015  . OAB (overactive bladder)   . Osteoarthritis   . Osteopenia determined by x-ray 02/10/2016  . Recurrent UTI   . Urinary incontinence   . Vertigo     Past Surgical History:  Procedure Laterality Date  . COLONOSCOPY WITH PROPOFOL N/A 11/11/2015   Procedure: COLONOSCOPY WITH PROPOFOL;  Surgeon: Lollie Sails, MD;  Location: [facility] ENDOSCOPY;  Service: Endoscopy;  Laterality: N/A;  . COLONOSCOPY WITH PROPOFOL N/A 06/24/2018   Procedure: COLONOSCOPY WITH PROPOFOL;  Surgeon: Lollie Sails, MD;  Location: [facility] ENDOSCOPY;  Service: Endoscopy;  Laterality: N/A;  . ESOPHAGOGASTRODUODENOSCOPY  11/13/2013  . OOPHORECTOMY    . TOTAL ABDOMINAL HYSTERECTOMY  Jan 2012   Complete, due to heavy bleeding and grape-fruit size fibroid     Family History  Problem Relation Age of Onset  . Hypertension Mother   . Cancer Mother        skin   . Heart disease Father        CHF  . Hypertension Father   . Stroke Father        mini strokes  . Cancer Father        skin  . Heart attack Father   . Thyroid disease Father   . Ovarian cancer Maternal Grandmother 66  . Cancer Maternal Grandmother        ovarian/brain  . Heart attack Maternal Grandfather   . Heart attack Paternal Grandfather   . Diabetes Maternal Uncle   . Thyroid disease Sister   . Kidney disease Paternal Grandmother   . Breast cancer Neg Hx     Social History:  reports that she has never smoked. She has never used smokeless tobacco. She reports that she does not drink alcohol or use drugs.  Allergies:  Allergies  Allergen Reactions  . Baclofen Other (See Comments)    Dizziness  . Hydrocodone Other (See Comments)    Headache,dizziness  . Codeine Other (See Comments)  . Hydroxyzine   . Sulfa Antibiotics Rash    Medications reviewed.    ROS Full ROS performed and is otherwise negative other than what is stated in HPI   BP 129/78   Pulse 98   Temp (!) 97.5 F (36.4 C) (Temporal)   Resp (!) 24   Ht 5\' 4"  (1.626 m)  Wt 142 lb (64.4 kg)   SpO2 97%   BMI 24.37 kg/m   Physical Exam  Constitutional: She is oriented to person, place, and time. She appears well-developed and well-nourished.  Neck: Normal range of motion. No JVD present. No tracheal deviation present. No thyromegaly present.  Cardiovascular: Normal rate and regular rhythm.  Pulmonary/Chest: Effort normal and breath sounds normal. No stridor. No respiratory distress.  Abdominal: Soft. She exhibits no distension and no mass. There is no tenderness. There is no rebound and no guarding.  Neurological: She is alert and oriented to person, place, and time. She displays normal reflexes. No cranial nerve deficit. She exhibits normal muscle tone. Coordination normal.  Skin: Capillary refill takes less than 2 seconds.  Psychiatric: She has a normal mood and affect. Her behavior is normal.  Judgment and thought content normal.  Nursing note and vitals reviewed.     Assessment/Plan: Recurrent sigmoid diverticulitis.  Had a lengthy discussion with the patient regarding options and given that she has had episodes of diverticulitis within this last year she wishes to proceed with elective laparoscopic sigmoid colectomy.  She just completed a colonoscopy that was clear.  She is in very reasonable shape medically and is ready to have her surgery done.  Procedure discussed with the patient detail.  Risks, benefit and possible complications including but not limited to: Bleeding, infection, bladder injury, ureter injury and need for colostomy were discussed with the patient in detail.  She understands and wishes to proceed. Plan for laparoscopic sigmoid colectomy   Greater than 50% of the 25 minutes  visit was spent in counseling/coordination of care   Caroleen Hamman, MD [place] Surgeon

## 2018-06-30 NOTE — Progress Notes (Signed)
Patient was seen in the office today by Dr. Dahlia Byes.   The patient wishes to contact the office once she is ready to schedule surgery-lap sigmoid colectomy.   The patient will need to complete a formal bowel prep. We need to verify which pharmacy patient would like prescriptions sent in to.   She will also need to have an office pre-admit appointment.   *Per Dr. Dahlia Byes, anyone will be okay to assist with this case.

## 2018-06-30 NOTE — H&P (View-Only) (Signed)
Outpatient Surgical Follow Up  06/30/2018  Whitney Cooper is an 57 y.o. female.   Chief Complaint  Patient presents with  . Follow-up     f/u diverticulitis; pt to have colonoscopy with dr Gustavo Lah on 06-24-18    HPI: Whitney Cooper following up for recurrent diverticulitis.  She completed a colonoscopy that I have personally review showing no evidence of cancer no evidence of suspicious masses.  She has had no more episode of diverticulitis but she does have occasional left flank pain.  No nausea no vomiting no fevers no chills no weight loss.  She is able to perform more than 4 METS of activity without any shortness with or chest pain.  Past Medical History:  Diagnosis Date  . Beat, premature ventricular 06/21/2015  . Bundle branch block   . Diverticulitis of colon 07/07/2009  . Dry eyes   . Essential (primary) hypertension 06/21/2015  . Fibromyalgia   . GERD (gastroesophageal reflux disease)   . Headache    migraine  . History of chicken pox   . History of kidney stones   . Hyperlipidemia   . Hypertension   . IFG (impaired fasting glucose) 01/24/2015  . OAB (overactive bladder)   . Osteoarthritis   . Osteopenia determined by x-ray 02/10/2016  . Recurrent UTI   . Urinary incontinence   . Vertigo     Past Surgical History:  Procedure Laterality Date  . COLONOSCOPY WITH PROPOFOL N/A 11/11/2015   Procedure: COLONOSCOPY WITH PROPOFOL;  Surgeon: Lollie Sails, MD;  Location: [facility] ENDOSCOPY;  Service: Endoscopy;  Laterality: N/A;  . COLONOSCOPY WITH PROPOFOL N/A 06/24/2018   Procedure: COLONOSCOPY WITH PROPOFOL;  Surgeon: Lollie Sails, MD;  Location: [facility] ENDOSCOPY;  Service: Endoscopy;  Laterality: N/A;  . ESOPHAGOGASTRODUODENOSCOPY  11/13/2013  . OOPHORECTOMY    . TOTAL ABDOMINAL HYSTERECTOMY  Jan 2012   Complete, due to heavy bleeding and grape-fruit size fibroid     Family History  Problem Relation Age of Onset  . Hypertension Mother   . Cancer Mother        skin   . Heart disease Father        CHF  . Hypertension Father   . Stroke Father        mini strokes  . Cancer Father        skin  . Heart attack Father   . Thyroid disease Father   . Ovarian cancer Maternal Grandmother 66  . Cancer Maternal Grandmother        ovarian/brain  . Heart attack Maternal Grandfather   . Heart attack Paternal Grandfather   . Diabetes Maternal Uncle   . Thyroid disease Sister   . Kidney disease Paternal Grandmother   . Breast cancer Neg Hx     Social History:  reports that she has never smoked. She has never used smokeless tobacco. She reports that she does not drink alcohol or use drugs.  Allergies:  Allergies  Allergen Reactions  . Baclofen Other (See Comments)    Dizziness  . Hydrocodone Other (See Comments)    Headache,dizziness  . Codeine Other (See Comments)  . Hydroxyzine   . Sulfa Antibiotics Rash    Medications reviewed.    ROS Full ROS performed and is otherwise negative other than what is stated in HPI   BP 129/78   Pulse 98   Temp (!) 97.5 F (36.4 C) (Temporal)   Resp (!) 24   Ht 5\' 4"  (1.626 m)  Wt 142 lb (64.4 kg)   SpO2 97%   BMI 24.37 kg/m   Physical Exam  Constitutional: She is oriented to person, place, and time. She appears well-developed and well-nourished.  Neck: Normal range of motion. No JVD present. No tracheal deviation present. No thyromegaly present.  Cardiovascular: Normal rate and regular rhythm.  Pulmonary/Chest: Effort normal and breath sounds normal. No stridor. No respiratory distress.  Abdominal: Soft. She exhibits no distension and no mass. There is no tenderness. There is no rebound and no guarding.  Neurological: She is alert and oriented to person, place, and time. She displays normal reflexes. No cranial nerve deficit. She exhibits normal muscle tone. Coordination normal.  Skin: Capillary refill takes less than 2 seconds.  Psychiatric: She has a normal mood and affect. Her behavior is normal.  Judgment and thought content normal.  Nursing note and vitals reviewed.     Assessment/Plan: Recurrent sigmoid diverticulitis.  Had a lengthy discussion with the patient regarding options and given that she has had episodes of diverticulitis within this last year she wishes to proceed with elective laparoscopic sigmoid colectomy.  She just completed a colonoscopy that was clear.  She is in very reasonable shape medically and is ready to have her surgery done.  Procedure discussed with the patient detail.  Risks, benefit and possible complications including but not limited to: Bleeding, infection, bladder injury, ureter injury and need for colostomy were discussed with the patient in detail.  She understands and wishes to proceed. Plan for laparoscopic sigmoid colectomy   Greater than 50% of the 25 minutes  visit was spent in counseling/coordination of care   Caroleen Hamman, MD [place] Surgeon

## 2018-07-02 ENCOUNTER — Telehealth: Payer: Self-pay | Admitting: *Deleted

## 2018-07-02 ENCOUNTER — Other Ambulatory Visit: Payer: Self-pay | Admitting: *Deleted

## 2018-07-02 MED ORDER — BISACODYL 5 MG PO TBEC: DELAYED_RELEASE_TABLET | ORAL | 0 refills | Status: DC

## 2018-07-02 MED ORDER — POLYETHYLENE GLYCOL 3350 17 GM/SCOOP PO POWD: ORAL | 0 refills | Status: DC

## 2018-07-02 MED ORDER — ERYTHROMYCIN 500 MG PO TBEC: 500.0000 mg | DELAYED_RELEASE_TABLET | ORAL | 0 refills | Status: DC

## 2018-07-02 MED ORDER — NEOMYCIN SULFATE 500 MG PO TABS: ORAL_TABLET | ORAL | 0 refills | Status: DC

## 2018-07-02 NOTE — Telephone Encounter (Signed)
I normally send in Flagyl 500 mg one tablet at 6 pm and one tablet at 11 pm the evening prior to surgery for Dr. Bary Castilla in addition to neomycin.   Do you want Flagyl 500 mg two tablets at 8 am, 2 pm, and 8 pm since that is what we send for erythromycin?  Please clarify.   Thanks.

## 2018-07-02 NOTE — Telephone Encounter (Signed)
Per Mickel Baas at [facility], they are unable to get any erythromycin and she has checked with other pharmacies and they are unable to get this medication as well.   Note to Dr. Dahlia Byes to see what he would like to prescribe instead for prep.   Patient aware of the above and will be contacted once we hear back from Dr. Dahlia Byes.

## 2018-07-02 NOTE — Telephone Encounter (Signed)
Flagyl and neomycin is fine

## 2018-07-02 NOTE — Progress Notes (Signed)
Patient's surgery to be scheduled for  07-29-2018 at [facility] with Dr. Dahlia Byes. The patient has been asked to complete a formal bowel prep. Prescriptions have been sent in to [facility] in [place].   Surgery instructions and bowel prep will be mailed to the patient per her request.   The patient will be contacted once a  Pre-Admission appointment date and time is arranged. Patient will check in at the [facility], [address] (first floor).   The patient is aware to call the office should she have further questions.

## 2018-07-02 NOTE — Telephone Encounter (Signed)
Patient was contacted and surgery date for 07-29-2018 was confirmed- Dr. Dahlia Byes with Dr. Genevive Bi to assist.   She was notified to pre-admit on 07-22-18 at 9:30 am.  The patient was instructed to call the office should she have further questions. She verbalizes understanding.

## 2018-07-03 ENCOUNTER — Telehealth: Payer: Self-pay | Admitting: *Deleted

## 2018-07-03 NOTE — Telephone Encounter (Signed)
Yes the three doses would be great

## 2018-07-03 NOTE — Telephone Encounter (Signed)
I have called [facility] and spoken with [place].   Since they cannot get erythromycin for patient's bowel prep, we have called in a prescription for Flagyl 500 mg 6 tablets no refills take two tablets at 8 am, two tablets at 2 pm, and two tablets at 8 pm the day prior to surgery. I spoke with Nada Boozer at [facility] in regards to prescription this morning.   Patient aware of medication change and verbalizes understanding.

## 2018-07-08 DIAGNOSIS — M9901 Segmental and somatic dysfunction of cervical region: Secondary | ICD-10-CM | POA: Diagnosis not present

## 2018-07-08 DIAGNOSIS — M531 Cervicobrachial syndrome: Secondary | ICD-10-CM | POA: Diagnosis not present

## 2018-07-08 DIAGNOSIS — M542 Cervicalgia: Secondary | ICD-10-CM | POA: Diagnosis not present

## 2018-07-15 DIAGNOSIS — R350 Frequency of micturition: Secondary | ICD-10-CM | POA: Diagnosis not present

## 2018-07-15 DIAGNOSIS — N390 Urinary tract infection, site not specified: Secondary | ICD-10-CM | POA: Diagnosis not present

## 2018-07-15 DIAGNOSIS — R319 Hematuria, unspecified: Secondary | ICD-10-CM | POA: Diagnosis not present

## 2018-07-17 ENCOUNTER — Telehealth: Payer: Self-pay | Admitting: Surgery

## 2018-07-17 NOTE — Telephone Encounter (Signed)
Patient was seen at [facility] urgent center on 07/15/18. Was diagnosed with a urinary tract infection-positive blood and bacteria in urine sample. Patient was given cipro and flagyl-10 day course. Patient would like to speak with a nurse to make sure this will not effect her upcoming surgery.   Patient is having a laparoscopic sigmoid colectomy on 07/29/18 with Dr Dahlia Byes.   Please call patient on mobile phone first-[phone]. VM can be left or call her work phone at [phone].

## 2018-07-17 NOTE — Telephone Encounter (Signed)
Spoke with patient go ahead with antibiotic for UTI will not interfere with surgery.

## 2018-07-18 ENCOUNTER — Encounter: Payer: Self-pay | Admitting: Nurse Practitioner

## 2018-07-18 ENCOUNTER — Ambulatory Visit: Payer: 59 | Admitting: Nurse Practitioner

## 2018-07-18 ENCOUNTER — Other Ambulatory Visit
Admission: RE | Admit: 2018-07-18 | Discharge: 2018-07-18 | Disposition: A | Discharge status: Home / Self Care | Payer: 59 | Source: Ambulatory Visit | Attending: Nurse Practitioner | Admitting: Nurse Practitioner

## 2018-07-18 ENCOUNTER — Other Ambulatory Visit (HOSPITAL_COMMUNITY)
Admission: RE | Admit: 2018-07-18 | Discharge: 2018-07-18 | Disposition: A | Discharge status: Home / Self Care | Payer: 59 | Source: Ambulatory Visit | Attending: Nurse Practitioner | Admitting: Nurse Practitioner

## 2018-07-18 ENCOUNTER — Ambulatory Visit
Admission: RE | Admit: 2018-07-18 | Discharge: 2018-07-18 | Disposition: A | Discharge status: Home / Self Care | Payer: 59 | Source: Ambulatory Visit | Attending: Nurse Practitioner | Admitting: Nurse Practitioner

## 2018-07-18 VITALS — BP 138/76 | HR 89 | Temp 97.9°F | Resp 16 | Ht 64.0 in | Wt 141.1 lb

## 2018-07-18 DIAGNOSIS — R102 Pelvic and perineal pain: Secondary | ICD-10-CM | POA: Insufficient documentation

## 2018-07-18 DIAGNOSIS — R300 Dysuria: Secondary | ICD-10-CM | POA: Diagnosis not present

## 2018-07-18 DIAGNOSIS — N949 Unspecified condition associated with female genital organs and menstrual cycle: Secondary | ICD-10-CM | POA: Diagnosis not present

## 2018-07-18 DIAGNOSIS — R1032 Left lower quadrant pain: Secondary | ICD-10-CM

## 2018-07-18 DIAGNOSIS — R197 Diarrhea, unspecified: Secondary | ICD-10-CM | POA: Diagnosis not present

## 2018-07-18 LAB — CBC WITH DIFFERENTIAL/PLATELET
Abs Immature Granulocytes: 0.02 10*3/uL (ref 0.00–0.07)
BASOS ABS: 0 10*3/uL (ref 0.0–0.1)
Basophils Relative: 0 %
Eosinophils Absolute: 0.2 10*3/uL (ref 0.0–0.5)
Eosinophils Relative: 2 %
HCT: 44.3 % (ref 36.0–46.0)
Hemoglobin: 14.7 g/dL (ref 12.0–15.0)
Immature Granulocytes: 0 %
LYMPHS PCT: 25 %
Lymphs Abs: 2.4 10*3/uL (ref 0.7–4.0)
MCH: 29.1 pg (ref 26.0–34.0)
MCHC: 33.2 g/dL (ref 30.0–36.0)
MCV: 87.7 fL (ref 80.0–100.0)
Monocytes Absolute: 0.7 10*3/uL (ref 0.1–1.0)
Monocytes Relative: 7 %
Neutro Abs: 6.2 10*3/uL (ref 1.7–7.7)
Neutrophils Relative %: 66 %
Platelets: 296 10*3/uL (ref 150–400)
RBC: 5.05 MIL/uL (ref 3.87–5.11)
RDW: 12.8 % (ref 11.5–15.5)
WBC: 9.5 10*3/uL (ref 4.0–10.5)
nRBC: 0 % (ref 0.0–0.2)

## 2018-07-18 LAB — POCT I-STAT CREATININE: Creatinine, Ser: 0.6 mg/dL (ref 0.44–1.00)

## 2018-07-18 MED ORDER — FLUCONAZOLE 150 MG PO TABS: 150.0000 mg | ORAL_TABLET | Freq: Once | ORAL | 0 refills | Status: AC

## 2018-07-18 MED ORDER — IOPAMIDOL (ISOVUE-300) INJECTION 61%
100.0000 mL | Freq: Once | INTRAVENOUS | Status: AC | PRN
  Administered 2018-07-18: 100 mL via INTRAVENOUS

## 2018-07-18 NOTE — Progress Notes (Signed)
Name: Whitney Cooper   MRN: [id number]    DOB: Jan 19, 1961   Date:07/18/2018       Progress Note  Subjective  Chief Complaint  Chief Complaint  Patient presents with  . Urinary Tract Infection    patient was seen at [facility] urgent care on 07/15/18. she has been having burning upon urination and bladder spasms. she also had some lower back pain. she was given Flagyl and Cipro for a UTI.     HPI  Patient was seen and treated for UTI in 07/15/2018 was having dysuria, bladder spasms, was also having left lower quadrant pain so they treated for possible diverticulitis and UTI with flagyl and cipro- still taking antibiotic course. States left quadrant pain has resolved and dysuria resolved initially but this morning is having mild dysuria and urgency. Was having chills last night and some nausea- but thinks its related to antibiotics. She has been having diarrhea the past few days started 12/23. Has had good appetite. No bladder spasms anymore.   Patient Active Problem List   Diagnosis Date Noted  . Aortic atherosclerosis ([place]) 04/15/2018  . Preventative health care 02/12/2018  . Anxiety 12/13/2017  . Insomnia 04/10/2016  . Upper back pain, chronic 02/10/2016  . Neck pain, chronic 02/10/2016  . Medication monitoring encounter 11/15/2015  . Essential (primary) hypertension 06/21/2015  . Combined fat and carbohydrate induced hyperlipemia 06/21/2015  . Beat, premature ventricular 06/21/2015  . Urinary frequency 03/15/2015  . Strangury 03/15/2015  . Incontinence 03/15/2015  . Atrophic vaginitis 03/15/2015  . Abnormal thyroid function test 01/24/2015  . IFG (impaired fasting glucose) 01/24/2015  . Overweight (BMI 25.0-29.9) 01/19/2015  . Urinary retention with incomplete bladder emptying 01/19/2015  . Hyperlipidemia   . Osteoarthritis   . GERD (gastroesophageal reflux disease)   . Fibromyalgia   . Diverticulitis of colon 07/07/2009    Past Medical History:  Diagnosis Date  . Beat,  premature ventricular 06/21/2015  . Bundle branch block   . Diverticulitis of colon 07/07/2009  . Dry eyes   . Essential (primary) hypertension 06/21/2015  . Fibromyalgia   . GERD (gastroesophageal reflux disease)   . Headache    migraine  . History of chicken pox   . History of kidney stones   . Hyperlipidemia   . Hypertension   . IFG (impaired fasting glucose) 01/24/2015  . OAB (overactive bladder)   . Osteoarthritis   . Osteopenia determined by x-ray 02/10/2016  . Recurrent UTI   . Urinary incontinence   . Vertigo     Past Surgical History:  Procedure Laterality Date  . COLONOSCOPY WITH PROPOFOL N/A 11/11/2015   Procedure: COLONOSCOPY WITH PROPOFOL;  Surgeon: Lollie Sails, MD;  Location: [facility] ENDOSCOPY;  Service: Endoscopy;  Laterality: N/A;  . COLONOSCOPY WITH PROPOFOL N/A 06/24/2018   Procedure: COLONOSCOPY WITH PROPOFOL;  Surgeon: Lollie Sails, MD;  Location: [facility] ENDOSCOPY;  Service: Endoscopy;  Laterality: N/A;  . ESOPHAGOGASTRODUODENOSCOPY  11/13/2013  . OOPHORECTOMY    . TOTAL ABDOMINAL HYSTERECTOMY  Jan 2012   Complete, due to heavy bleeding and grape-fruit size fibroid     Social History   Tobacco Use  . Smoking status: Never Smoker  . Smokeless tobacco: Never Used  Substance Use Topics  . Alcohol use: No     Current Outpatient Medications:  .  acetaminophen (TYLENOL) 500 MG tablet, Take 1,000 mg by mouth every 6 (six) hours as needed for moderate pain., Disp: , Rfl:  .  atorvastatin (LIPITOR)  20 MG tablet, Take 1 tablet (20 mg total) by mouth every other day., Disp: 30 tablet, Rfl: 5 .  bisacodyl (BISACODYL) 5 MG EC tablet, Take 4 tablets (5 mg each) at 8 am the day before surgery., Disp: 4 tablet, Rfl: 0 .  Cholecalciferol (VITAMIN D3) 1000 units CAPS, Take 1,000 Units by mouth daily. , Disp: , Rfl:  .  Coenzyme Q10 (CO Q-10) 100 MG CAPS, Take 100 mg by mouth daily. , Disp: , Rfl:  .  diazepam (VALIUM) 2 MG tablet, Take 0.5 tablets (1 mg total) by  mouth every 12 (twelve) hours as needed for anxiety., Disp: 15 tablet, Rfl: 0 .  Erythromycin 500 MG TBEC, Take 500 mg by mouth as directed. Take two tablets at 8 am, two tablets at 2 pm, and two tablets at 8 pm the day before surgery., Disp: 6 tablet, Rfl: 0 .  esomeprazole (NEXIUM) 40 MG capsule, Take 40 mg by mouth every morning. , Disp: , Rfl:  .  GAVILYTE-N WITH FLAVOR PACK 420 g solution, , Disp: , Rfl:  .  hydroxypropyl methylcellulose / hypromellose (ISOPTO TEARS / GONIOVISC) 2.5 % ophthalmic solution, Place 1 drop into both eyes daily., Disp: , Rfl:  .  loratadine (CLARITIN) 10 MG tablet, Take 10 mg by mouth daily as needed for allergies. , Disp: , Rfl:  .  meclizine (ANTIVERT) 25 MG tablet, Take 25 mg by mouth 3 (three) times daily as needed for dizziness. , Disp: , Rfl:  .  Melatonin 5 MG CAPS, Take 5 mg by mouth at bedtime. , Disp: , Rfl:  .  metoprolol succinate (TOPROL-XL) 25 MG 24 hr tablet, Take 1 tablet (25 mg total) by mouth daily., Disp: 30 tablet, Rfl: 2 .  Multiple Vitamin (MULTI-VITAMINS) TABS, Take 1 tablet by mouth daily. , Disp: , Rfl:  .  neomycin (MYCIFRADIN) 500 MG tablet, Take two tablets at 8 am, two tablets at 2 pm, and two tablets at 8 pm the day before surgery., Disp: 6 tablet, Rfl: 0 .  Omega-3 Fatty Acids (FISH OIL) 1000 MG CAPS, Take 1,000 mg by mouth daily. , Disp: , Rfl:  .  polyethylene glycol powder (GLYCOLAX/MIRALAX) powder, 255 grams one bottle for bowel prep. Use as directed., Disp: 255 g, Rfl: 0 .  polyethylene glycol-electrolytes (NULYTELY/GOLYTELY) 420 g solution, Take by mouth., Disp: , Rfl:  .  SUMAtriptan (IMITREX) 100 MG tablet, Take 100 mg by mouth every 2 (two) hours as needed for migraine. , Disp: , Rfl:   Allergies  Allergen Reactions  . Baclofen Other (See Comments)    Dizziness  . Hydrocodone Other (See Comments)    Headache,dizziness  . Codeine Other (See Comments)  . Hydroxyzine     Made her very nervous  . Sulfa Antibiotics Rash     ROS   No other specific complaints in a complete review of systems (except as listed in HPI above).  Objective  Vitals:   07/18/18 1152  BP: 138/76  Pulse: 89  Resp: 16  Temp: 97.9 F (36.6 C)  TempSrc: Oral  SpO2: 99%  Weight: 141 lb 1.6 oz (64 kg)  Height: 5\' 4"  (1.626 m)    Body mass index is 24.22 kg/m.  Nursing Note and Vital Signs reviewed.  Physical Exam Constitutional:      Appearance: Normal appearance. She is not ill-appearing or diaphoretic.  HENT:     Head: Normocephalic and atraumatic.  Cardiovascular:     Rate and Rhythm: Normal rate  and regular rhythm.  Pulmonary:     Effort: Pulmonary effort is normal.     Breath sounds: Normal breath sounds.  Abdominal:     General: Abdomen is flat. Bowel sounds are increased.     Palpations: Abdomen is soft.     Tenderness: There is abdominal tenderness in the periumbilical area, suprapubic area and left lower quadrant. There is no right CVA tenderness or left CVA tenderness.    Skin:    General: Skin is warm and dry.  Neurological:     General: No focal deficit present.     Mental Status: She is alert.  Psychiatric:        Mood and Affect: Mood normal.        Thought Content: Thought content normal.      No results found for this or any previous visit (from the past 48 hour(s)).  Assessment & Plan  1. Dysuria Discussed use of azo for no more than 2-3 days, after discussing seems more like burning is constant and worsened with urination consider vaginitis- yeast infection from antibiotic use - Urine Culture - Cervicovaginal ancillary only  2. Vaginal burning - Cervicovaginal ancillary only - fluconazole (DIFLUCAN) 150 MG tablet; Take 1 tablet (150 mg total) by mouth once for 1 dose.  Dispense: 1 tablet; Refill: 0  3. Suprapubic pressure - Cervicovaginal ancillary only  4. Left lower quadrant abdominal pain Discussed with patients PCP who recommended STAT imaging, note sent to surgeon Pabon,  patient is currently being treated for diverticulitis and UTI and is having return of symptoms, previous CT showed asymmetry and air bubble in bladder- no no fistula, plan to re-evaluate with scan for mass, fistula and discussed ER precautions with patient.  - CBC with Differential - CT Abdomen Pelvis W Contrast; Future   -Red flags and when to present for emergency care or RTC including fever >101.59F, vomiting, new/worsening/un-resolving symptoms,  reviewed with patient at time of visit. Follow up and care instructions discussed and provided in AVS.

## 2018-07-18 NOTE — Patient Instructions (Signed)
-   I am ordering a stat CT scan of your abdomen and a complete blood cell count to be completed at the hospital. We are going to work on scheduling that for you to get completed today.  -Take the diflucan today to resolve likely yeast infection and take probiotics to replenish good gut bacteria.  - If you develop worsening abdominal pain, vomiting not controlled by nausea medicine, fever or chills not resolved with tylenol please get immediate medical attention.

## 2018-07-18 NOTE — Addendum Note (Signed)
Addended by: Inda Coke on: 07/18/2018 03:30 PM   Modules accepted: Orders

## 2018-07-19 LAB — URINE CULTURE
MICRO NUMBER:: 91546252
Result:: NO GROWTH
SPECIMEN QUALITY:: ADEQUATE

## 2018-07-21 LAB — CERVICOVAGINAL ANCILLARY ONLY
Bacterial vaginitis: NEGATIVE
Candida vaginitis: POSITIVE — AB
Chlamydia: NEGATIVE
Neisseria Gonorrhea: NEGATIVE
TRICH (WINDOWPATH): NEGATIVE

## 2018-07-22 ENCOUNTER — Other Ambulatory Visit: Payer: Self-pay | Admitting: Nurse Practitioner

## 2018-07-22 ENCOUNTER — Encounter
Admission: RE | Admit: 2018-07-22 | Discharge: 2018-07-22 | Disposition: A | Discharge status: Home / Self Care | Payer: 59 | Source: Ambulatory Visit | Attending: Surgery | Admitting: Surgery

## 2018-07-22 ENCOUNTER — Other Ambulatory Visit: Payer: Self-pay

## 2018-07-22 DIAGNOSIS — Z01818 Encounter for other preprocedural examination: Secondary | ICD-10-CM | POA: Diagnosis not present

## 2018-07-22 DIAGNOSIS — I4510 Unspecified right bundle-branch block: Secondary | ICD-10-CM | POA: Diagnosis not present

## 2018-07-22 DIAGNOSIS — I10 Essential (primary) hypertension: Secondary | ICD-10-CM | POA: Diagnosis not present

## 2018-07-22 LAB — TYPE AND SCREEN
ABO/RH(D): B POS
Antibody Screen: NEGATIVE

## 2018-07-22 MED ORDER — FLUCONAZOLE 150 MG PO TABS: 150.0000 mg | ORAL_TABLET | Freq: Once | ORAL | 0 refills | Status: AC

## 2018-07-22 NOTE — Patient Instructions (Addendum)
Your procedure is scheduled on: Tues 07/29/18 Report to Day Surgery. To find out your arrival time please call [phone] between 1PM - 3PM on Mon 07/28/18.  Remember: Instructions that are not followed completely may result in serious medical risk,  up to and including death, or upon the discretion of your surgeon and anesthesiologist your  surgery may need to be rescheduled.     _X__ 1. Do not eat food after midnight the night before your procedure.                 No gum chewing or hard candies. You may drink clear liquids up to 2 hours                 before you are scheduled to arrive for your surgery- DO not drink clear                 liquids within 2 hours of the start of your surgery.                 Clear Liquids include:  water, apple juice without pulp, clear carbohydrate                 drink such as Clearfast of Gatorade, Black Coffee or Tea (Do not add                 anything to coffee or tea).  __X__2.  On the morning of surgery brush your teeth with toothpaste and water, you                may rinse your mouth with mouthwash if you wish.  Do not swallow any toothpaste of mouthwash.     _X__ 3.  No Alcohol for 24 hours before or after surgery.   ___ 4.  Do Not Smoke or use e-cigarettes For 24 Hours Prior to Your Surgery.                 Do not use any chewable tobacco products for at least 6 hours prior to                 surgery.  ____  5.  Bring all medications with you on the day of surgery if instructed.   __x__  6.  Notify your doctor if there is any change in your medical condition      (cold, fever, infections).     Do not wear jewelry, make-up, hairpins, clips or nail polish. Do not wear lotions, powders, or perfumes. You may wear deodorant. Do not shave 48 hours prior to surgery. Men may shave face and neck. Do not bring valuables to the hospital.    [facility] is not responsible for any belongings or valuables.  Contacts,  dentures or bridgework may not be worn into surgery. Leave your suitcase in the car. After surgery it may be brought to your room. For patients admitted to the hospital, discharge time is determined by your treatment team.   Patients discharged the day of surgery will not be allowed to drive home.   Please read over the following fact sheets that you were given:    __x__ Take these medicines the morning of surgery with A SIP OF WATER:    1. diazepam (VALIUM) 2 MG tablet  2. esomeprazole (NEXIUM) 40 MG capsule night before and morning surgery  3. metoprolol succinate (TOPROL-XL) 25 MG 24 hr tablet  4.  5.  6.  ____  Fleet Enema (as directed)   _x___ Use CHG Soap as directed  ____ Use inhalers on the day of surgery  ____ Stop metformin 2 days prior to surgery    ____ Take 1/2 of usual insulin dose the night before surgery. No insulin the morning          of surgery.   ____ Stop Coumadin/Plavix/aspirin on   ____ Stop Anti-inflammatories on    ____ Stop supplements until after surgery. Coenzyme Q10 (CO Q-10) 100 MG CAPS, Melatonin 5 MG CAPS, Omega-3 Fatty Acids (FISH OIL) 1000 MG CAPS  ____ Bring C-Pap to the hospital.   Follow instructions for bowel prep as per surgeon

## 2018-07-28 MED ORDER — SODIUM CHLORIDE 0.9 % IV SOLN
1.0000 g | INTRAVENOUS | Status: AC
  Administered 2018-07-29: 1 g via INTRAVENOUS
  Filled 2018-07-28: qty 1

## 2018-07-29 ENCOUNTER — Inpatient Hospital Stay
Admission: RE | Admit: 2018-07-29 | Discharge: 2018-08-07 | DRG: 330 | Disposition: A | Discharge status: Home / Self Care | Payer: 59 | Source: Ambulatory Visit | Attending: Surgery | Admitting: Surgery

## 2018-07-29 ENCOUNTER — Encounter: Payer: Self-pay | Admitting: *Deleted

## 2018-07-29 ENCOUNTER — Encounter
Admission: RE | Disposition: A | Discharge status: Home / Self Care | Payer: Self-pay | Source: Ambulatory Visit | Attending: Surgery

## 2018-07-29 ENCOUNTER — Inpatient Hospital Stay: Payer: 59 | Admitting: Anesthesiology

## 2018-07-29 ENCOUNTER — Other Ambulatory Visit: Payer: Self-pay

## 2018-07-29 DIAGNOSIS — Z9049 Acquired absence of other specified parts of digestive tract: Secondary | ICD-10-CM

## 2018-07-29 DIAGNOSIS — Z888 Allergy status to other drugs, medicaments and biological substances status: Secondary | ICD-10-CM | POA: Diagnosis not present

## 2018-07-29 DIAGNOSIS — R109 Unspecified abdominal pain: Secondary | ICD-10-CM

## 2018-07-29 DIAGNOSIS — T85598A Other mechanical complication of other gastrointestinal prosthetic devices, implants and grafts, initial encounter: Secondary | ICD-10-CM | POA: Diagnosis not present

## 2018-07-29 DIAGNOSIS — Z8249 Family history of ischemic heart disease and other diseases of the circulatory system: Secondary | ICD-10-CM | POA: Diagnosis not present

## 2018-07-29 DIAGNOSIS — M8580 Other specified disorders of bone density and structure, unspecified site: Secondary | ICD-10-CM | POA: Diagnosis present

## 2018-07-29 DIAGNOSIS — Y733 Surgical instruments, materials and gastroenterology and urology devices (including sutures) associated with adverse incidents: Secondary | ICD-10-CM | POA: Diagnosis not present

## 2018-07-29 DIAGNOSIS — Z833 Family history of diabetes mellitus: Secondary | ICD-10-CM | POA: Diagnosis not present

## 2018-07-29 DIAGNOSIS — Z823 Family history of stroke: Secondary | ICD-10-CM

## 2018-07-29 DIAGNOSIS — Z881 Allergy status to other antibiotic agents status: Secondary | ICD-10-CM

## 2018-07-29 DIAGNOSIS — Z87442 Personal history of urinary calculi: Secondary | ICD-10-CM | POA: Diagnosis not present

## 2018-07-29 DIAGNOSIS — Z885 Allergy status to narcotic agent status: Secondary | ICD-10-CM

## 2018-07-29 DIAGNOSIS — Z882 Allergy status to sulfonamides status: Secondary | ICD-10-CM

## 2018-07-29 DIAGNOSIS — I454 Nonspecific intraventricular block: Secondary | ICD-10-CM | POA: Diagnosis present

## 2018-07-29 DIAGNOSIS — E785 Hyperlipidemia, unspecified: Secondary | ICD-10-CM | POA: Diagnosis present

## 2018-07-29 DIAGNOSIS — R509 Fever, unspecified: Secondary | ICD-10-CM

## 2018-07-29 DIAGNOSIS — K660 Peritoneal adhesions (postprocedural) (postinfection): Secondary | ICD-10-CM | POA: Diagnosis present

## 2018-07-29 DIAGNOSIS — K5732 Diverticulitis of large intestine without perforation or abscess without bleeding: Secondary | ICD-10-CM | POA: Diagnosis not present

## 2018-07-29 DIAGNOSIS — N3281 Overactive bladder: Secondary | ICD-10-CM | POA: Diagnosis present

## 2018-07-29 DIAGNOSIS — Z7689 Persons encountering health services in other specified circumstances: Secondary | ICD-10-CM | POA: Diagnosis not present

## 2018-07-29 DIAGNOSIS — R140 Abdominal distension (gaseous): Secondary | ICD-10-CM | POA: Diagnosis not present

## 2018-07-29 DIAGNOSIS — K9189 Other postprocedural complications and disorders of digestive system: Secondary | ICD-10-CM | POA: Diagnosis not present

## 2018-07-29 DIAGNOSIS — Y832 Surgical operation with anastomosis, bypass or graft as the cause of abnormal reaction of the patient, or of later complication, without mention of misadventure at the time of the procedure: Secondary | ICD-10-CM | POA: Diagnosis not present

## 2018-07-29 DIAGNOSIS — M1990 Unspecified osteoarthritis, unspecified site: Secondary | ICD-10-CM | POA: Diagnosis present

## 2018-07-29 DIAGNOSIS — K219 Gastro-esophageal reflux disease without esophagitis: Secondary | ICD-10-CM | POA: Diagnosis present

## 2018-07-29 DIAGNOSIS — Z8349 Family history of other endocrine, nutritional and metabolic diseases: Secondary | ICD-10-CM

## 2018-07-29 DIAGNOSIS — K5792 Diverticulitis of intestine, part unspecified, without perforation or abscess without bleeding: Secondary | ICD-10-CM | POA: Diagnosis not present

## 2018-07-29 DIAGNOSIS — Z8041 Family history of malignant neoplasm of ovary: Secondary | ICD-10-CM | POA: Diagnosis not present

## 2018-07-29 DIAGNOSIS — I10 Essential (primary) hypertension: Secondary | ICD-10-CM | POA: Diagnosis present

## 2018-07-29 DIAGNOSIS — M797 Fibromyalgia: Secondary | ICD-10-CM | POA: Diagnosis present

## 2018-07-29 HISTORY — PX: LAPAROSCOPIC RIGHT COLECTOMY: SHX5925

## 2018-07-29 LAB — CBC
HCT: 40.6 % (ref 36.0–46.0)
Hemoglobin: 13.9 g/dL (ref 12.0–15.0)
MCH: 28.9 pg (ref 26.0–34.0)
MCHC: 34.2 g/dL (ref 30.0–36.0)
MCV: 84.4 fL (ref 80.0–100.0)
NRBC: 0 % (ref 0.0–0.2)
PLATELETS: 87 10*3/uL — AB (ref 150–400)
RBC: 4.81 MIL/uL (ref 3.87–5.11)
RDW: 12.5 % (ref 11.5–15.5)
WBC: 19.2 10*3/uL — ABNORMAL HIGH (ref 4.0–10.5)

## 2018-07-29 LAB — CREATININE, SERUM
Creatinine, Ser: 0.79 mg/dL (ref 0.44–1.00)
GFR calc non Af Amer: >60 mL/min (ref >60)

## 2018-07-29 LAB — ABO/RH: ABO/RH(D): B POS

## 2018-07-29 SURGERY — COLECTOMY, RIGHT, LAPAROSCOPIC
Anesthesia: General | Site: Abdomen

## 2018-07-29 MED ORDER — DEXMEDETOMIDINE HCL 200 MCG/2ML IV SOLN
INTRAVENOUS | Status: DC | PRN
  Administered 2018-07-29: 8 ug via INTRAVENOUS

## 2018-07-29 MED ORDER — LACTATED RINGERS IV SOLN
INTRAVENOUS | Status: DC
  Administered 2018-07-29 (×5): via INTRAVENOUS

## 2018-07-29 MED ORDER — GABAPENTIN 300 MG PO CAPS
300.0000 mg | ORAL_CAPSULE | ORAL | Status: AC
  Administered 2018-07-29: 300 mg via ORAL

## 2018-07-29 MED ORDER — SUGAMMADEX SODIUM 200 MG/2ML IV SOLN
INTRAVENOUS | Status: DC | PRN
  Administered 2018-07-29: 128 mg via INTRAVENOUS

## 2018-07-29 MED ORDER — ONDANSETRON HCL 4 MG/2ML IJ SOLN
INTRAMUSCULAR | Status: AC
  Filled 2018-07-29: qty 2

## 2018-07-29 MED ORDER — SODIUM CHLORIDE (PF) 0.9 % IJ SOLN
INTRAMUSCULAR | Status: AC
  Filled 2018-07-29: qty 50

## 2018-07-29 MED ORDER — FENTANYL CITRATE (PF) 100 MCG/2ML IJ SOLN
INTRAMUSCULAR | Status: AC
  Filled 2018-07-29: qty 2

## 2018-07-29 MED ORDER — DEXMEDETOMIDINE HCL IN NACL 80 MCG/20ML IV SOLN
INTRAVENOUS | Status: AC
  Filled 2018-07-29: qty 20

## 2018-07-29 MED ORDER — METOPROLOL SUCCINATE ER 25 MG PO TB24
25.0000 mg | ORAL_TABLET | Freq: Every day | ORAL | Status: DC
  Administered 2018-07-30 – 2018-08-07 (×7): 25 mg via ORAL
  Filled 2018-07-29 (×7): qty 1

## 2018-07-29 MED ORDER — EPHEDRINE SULFATE 50 MG/ML IJ SOLN
INTRAMUSCULAR | Status: AC
  Filled 2018-07-29: qty 1

## 2018-07-29 MED ORDER — ACETAMINOPHEN 500 MG PO TABS
1000.0000 mg | ORAL_TABLET | Freq: Four times a day (QID) | ORAL | Status: DC
  Administered 2018-07-29 – 2018-08-07 (×24): 1000 mg via ORAL
  Administered 2018-08-07: 500 mg via ORAL
  Filled 2018-07-29 (×28): qty 2

## 2018-07-29 MED ORDER — ONDANSETRON HCL 4 MG/2ML IJ SOLN
INTRAMUSCULAR | Status: DC | PRN
  Administered 2018-07-29: 4 mg via INTRAVENOUS

## 2018-07-29 MED ORDER — DIAZEPAM 2 MG PO TABS: 1.0000 mg | ORAL_TABLET | Freq: Two times a day (BID) | ORAL | Status: DC | PRN

## 2018-07-29 MED ORDER — PROCHLORPERAZINE MALEATE 10 MG PO TABS
10.0000 mg | ORAL_TABLET | Freq: Four times a day (QID) | ORAL | Status: DC | PRN
  Filled 2018-07-29: qty 1

## 2018-07-29 MED ORDER — METHYLENE BLUE 0.5 % INJ SOLN
INTRAVENOUS | Status: AC
  Filled 2018-07-29: qty 10

## 2018-07-29 MED ORDER — PROPOFOL 500 MG/50ML IV EMUL
INTRAVENOUS | Status: AC
  Filled 2018-07-29: qty 50

## 2018-07-29 MED ORDER — MORPHINE SULFATE (PF) 2 MG/ML IV SOLN
2.0000 mg | INTRAVENOUS | Status: DC | PRN
  Administered 2018-08-01 – 2018-08-04 (×4): 2 mg via INTRAVENOUS
  Filled 2018-07-29 (×4): qty 1

## 2018-07-29 MED ORDER — LIDOCAINE HCL (CARDIAC) PF 100 MG/5ML IV SOSY
PREFILLED_SYRINGE | INTRAVENOUS | Status: DC | PRN
  Administered 2018-07-29: 60 mg via INTRAVENOUS

## 2018-07-29 MED ORDER — GABAPENTIN 400 MG PO CAPS
400.0000 mg | ORAL_CAPSULE | Freq: Three times a day (TID) | ORAL | Status: DC
  Administered 2018-07-29 – 2018-08-07 (×23): 400 mg via ORAL
  Filled 2018-07-29 (×23): qty 1

## 2018-07-29 MED ORDER — ENOXAPARIN SODIUM 40 MG/0.4ML ~~LOC~~ SOLN
40.0000 mg | SUBCUTANEOUS | Status: DC
  Administered 2018-07-30 – 2018-08-07 (×9): 40 mg via SUBCUTANEOUS
  Filled 2018-07-29 (×9): qty 0.4

## 2018-07-29 MED ORDER — PROPOFOL 10 MG/ML IV BOLUS
INTRAVENOUS | Status: AC
  Filled 2018-07-29: qty 20

## 2018-07-29 MED ORDER — KETOROLAC TROMETHAMINE 30 MG/ML IJ SOLN
30.0000 mg | Freq: Four times a day (QID) | INTRAMUSCULAR | Status: AC
  Administered 2018-07-29 – 2018-07-31 (×10): 30 mg via INTRAVENOUS
  Filled 2018-07-29 (×11): qty 1

## 2018-07-29 MED ORDER — SUCCINYLCHOLINE CHLORIDE 20 MG/ML IJ SOLN
INTRAMUSCULAR | Status: AC
  Filled 2018-07-29: qty 1

## 2018-07-29 MED ORDER — PROCHLORPERAZINE EDISYLATE 10 MG/2ML IJ SOLN
5.0000 mg | Freq: Four times a day (QID) | INTRAMUSCULAR | Status: DC | PRN
  Filled 2018-07-29: qty 2

## 2018-07-29 MED ORDER — ONDANSETRON 4 MG PO TBDP: 4.0000 mg | ORAL_TABLET | Freq: Four times a day (QID) | ORAL | Status: DC | PRN

## 2018-07-29 MED ORDER — KETAMINE HCL 50 MG/ML IJ SOLN
INTRAMUSCULAR | Status: DC | PRN
  Administered 2018-07-29: 25 mg via INTRAMUSCULAR

## 2018-07-29 MED ORDER — ROCURONIUM BROMIDE 100 MG/10ML IV SOLN
INTRAVENOUS | Status: DC | PRN
  Administered 2018-07-29: 10 mg via INTRAVENOUS
  Administered 2018-07-29: 30 mg via INTRAVENOUS
  Administered 2018-07-29 (×2): 20 mg via INTRAVENOUS

## 2018-07-29 MED ORDER — PROPOFOL 10 MG/ML IV BOLUS
INTRAVENOUS | Status: DC | PRN
  Administered 2018-07-29: 30 mg via INTRAVENOUS
  Administered 2018-07-29: 150 mg via INTRAVENOUS
  Administered 2018-07-29: 50 mg via INTRAVENOUS

## 2018-07-29 MED ORDER — FENTANYL CITRATE (PF) 100 MCG/2ML IJ SOLN
INTRAMUSCULAR | Status: DC | PRN
  Administered 2018-07-29 (×2): 50 ug via INTRAVENOUS
  Administered 2018-07-29: 25 ug via INTRAVENOUS
  Administered 2018-07-29: 100 ug via INTRAVENOUS
  Administered 2018-07-29: 50 ug via INTRAVENOUS
  Administered 2018-07-29: 25 ug via INTRAVENOUS
  Administered 2018-07-29: 50 ug via INTRAVENOUS

## 2018-07-29 MED ORDER — MIDAZOLAM HCL 2 MG/2ML IJ SOLN
INTRAMUSCULAR | Status: DC | PRN
  Administered 2018-07-29: 2 mg via INTRAVENOUS

## 2018-07-29 MED ORDER — ONDANSETRON HCL 4 MG/2ML IJ SOLN
4.0000 mg | Freq: Four times a day (QID) | INTRAMUSCULAR | Status: DC | PRN
  Administered 2018-08-01: 4 mg via INTRAVENOUS
  Filled 2018-07-29: qty 2

## 2018-07-29 MED ORDER — DEXAMETHASONE SODIUM PHOSPHATE 10 MG/ML IJ SOLN
INTRAMUSCULAR | Status: AC
  Filled 2018-07-29: qty 1

## 2018-07-29 MED ORDER — PANTOPRAZOLE SODIUM 40 MG PO TBEC
40.0000 mg | DELAYED_RELEASE_TABLET | Freq: Every day | ORAL | Status: DC
  Administered 2018-07-30 – 2018-08-07 (×8): 40 mg via ORAL
  Filled 2018-07-29 (×8): qty 1

## 2018-07-29 MED ORDER — EPHEDRINE SULFATE 50 MG/ML IJ SOLN
INTRAMUSCULAR | Status: DC | PRN
  Administered 2018-07-29: 10 mg via INTRAVENOUS

## 2018-07-29 MED ORDER — BUPIVACAINE-EPINEPHRINE (PF) 0.5% -1:200000 IJ SOLN
INTRAMUSCULAR | Status: AC
  Filled 2018-07-29: qty 90

## 2018-07-29 MED ORDER — OXYCODONE HCL 5 MG PO TABS
5.0000 mg | ORAL_TABLET | ORAL | Status: DC | PRN
  Administered 2018-07-29: 5 mg via ORAL
  Administered 2018-07-30: 10 mg via ORAL
  Administered 2018-07-30 – 2018-08-06 (×7): 5 mg via ORAL
  Filled 2018-07-29 (×4): qty 1
  Filled 2018-07-29 (×3): qty 2
  Filled 2018-07-29 (×4): qty 1
  Filled 2018-07-29: qty 2

## 2018-07-29 MED ORDER — FENTANYL CITRATE (PF) 100 MCG/2ML IJ SOLN
25.0000 ug | INTRAMUSCULAR | Status: DC | PRN
  Administered 2018-07-29: 25 ug via INTRAVENOUS

## 2018-07-29 MED ORDER — BUPIVACAINE-EPINEPHRINE (PF) 0.5% -1:200000 IJ SOLN
INTRAMUSCULAR | Status: DC | PRN
  Administered 2018-07-29: 30 mL via PERINEURAL

## 2018-07-29 MED ORDER — DEXAMETHASONE SODIUM PHOSPHATE 10 MG/ML IJ SOLN
INTRAMUSCULAR | Status: DC | PRN
  Administered 2018-07-29: 8 mg via INTRAVENOUS

## 2018-07-29 MED ORDER — LACTATED RINGERS IV SOLN
INTRAVENOUS | Status: DC
  Administered 2018-07-29 – 2018-07-31 (×4): via INTRAVENOUS

## 2018-07-29 MED ORDER — ROCURONIUM BROMIDE 50 MG/5ML IV SOLN
INTRAVENOUS | Status: AC
  Filled 2018-07-29: qty 1

## 2018-07-29 MED ORDER — ONDANSETRON HCL 4 MG/2ML IJ SOLN: 4.0000 mg | Freq: Once | INTRAMUSCULAR | Status: DC | PRN

## 2018-07-29 MED ORDER — MIDAZOLAM HCL 2 MG/2ML IJ SOLN
INTRAMUSCULAR | Status: AC
  Filled 2018-07-29: qty 2

## 2018-07-29 MED ORDER — CHLORHEXIDINE GLUCONATE CLOTH 2 % EX PADS: 6.0000 | MEDICATED_PAD | Freq: Once | CUTANEOUS | Status: DC

## 2018-07-29 MED ORDER — KETAMINE HCL 50 MG/ML IJ SOLN
INTRAMUSCULAR | Status: AC
  Filled 2018-07-29: qty 10

## 2018-07-29 MED ORDER — LIDOCAINE HCL (PF) 2 % IJ SOLN
INTRAMUSCULAR | Status: AC
  Filled 2018-07-29: qty 10

## 2018-07-29 MED ORDER — BUPIVACAINE LIPOSOME 1.3 % IJ SUSP
INTRAMUSCULAR | Status: AC
  Filled 2018-07-29: qty 20

## 2018-07-29 MED ORDER — SUMATRIPTAN SUCCINATE 50 MG PO TABS
100.0000 mg | ORAL_TABLET | ORAL | Status: DC | PRN
  Filled 2018-07-29: qty 2

## 2018-07-29 MED ORDER — PROPOFOL 500 MG/50ML IV EMUL
INTRAVENOUS | Status: DC | PRN
  Administered 2018-07-29: 75 ug/kg/min via INTRAVENOUS

## 2018-07-29 MED ORDER — SUGAMMADEX SODIUM 200 MG/2ML IV SOLN
INTRAVENOUS | Status: AC
  Filled 2018-07-29: qty 2

## 2018-07-29 MED ORDER — ACETAMINOPHEN 500 MG PO TABS
1000.0000 mg | ORAL_TABLET | ORAL | Status: AC
  Administered 2018-07-29: 1000 mg via ORAL

## 2018-07-29 MED ORDER — LORATADINE 10 MG PO TABS
10.0000 mg | ORAL_TABLET | Freq: Every day | ORAL | Status: DC | PRN
  Filled 2018-07-29: qty 1

## 2018-07-29 MED ORDER — MELATONIN 5 MG PO TABS
5.0000 mg | ORAL_TABLET | Freq: Every day | ORAL | Status: DC
  Administered 2018-08-01 – 2018-08-04 (×2): 5 mg via ORAL
  Filled 2018-07-29 (×10): qty 1

## 2018-07-29 MED ORDER — SODIUM CHLORIDE 0.9 % IV SOLN
INTRAVENOUS | Status: DC | PRN
  Administered 2018-07-29: 70 mL

## 2018-07-29 SURGICAL SUPPLY — 62 items
"PENCIL ELECTRO HAND CTR " (MISCELLANEOUS) ×1 IMPLANT
APPLIER CLIP 11 MED OPEN (CLIP) ×3
BLADE SURG SZ10 CARB STEEL (BLADE) ×3 IMPLANT
CANISTER SUCT 1200ML W/VALVE (MISCELLANEOUS) ×3 IMPLANT
CATH ROBINSON RED A/P 18FR (CATHETERS) ×2 IMPLANT
CLIP APPLIE 11 MED OPEN (CLIP) IMPLANT
COVER LIGHT HANDLE STERIS (MISCELLANEOUS) ×2 IMPLANT
COVER WAND RF STERILE (DRAPES) ×3 IMPLANT
DECANTER SPIKE VIAL GLASS SM (MISCELLANEOUS) ×3 IMPLANT
DERMABOND ADVANCED (GAUZE/BANDAGES/DRESSINGS) ×4
DERMABOND ADVANCED .7 DNX12 (GAUZE/BANDAGES/DRESSINGS) ×2 IMPLANT
DRAPE INCISE IOBAN 66X45 STRL (DRAPES) ×3 IMPLANT
ELECT CAUTERY BLADE 6.4 (BLADE) ×3 IMPLANT
ELECT REM PT RETURN 9FT ADLT (ELECTROSURGICAL) ×3
ELECTRODE REM PT RTRN 9FT ADLT (ELECTROSURGICAL) ×1 IMPLANT
GLOVE BIO SURGEON STRL SZ7 (GLOVE) ×6 IMPLANT
GOWN STRL REUS W/ TWL LRG LVL3 (GOWN DISPOSABLE) ×4 IMPLANT
GOWN STRL REUS W/TWL LRG LVL3 (GOWN DISPOSABLE) ×8
HANDLE SUCTION POOLE (INSTRUMENTS) ×1 IMPLANT
HANDLE YANKAUER SUCT BULB TIP (MISCELLANEOUS) ×3 IMPLANT
HOLDER FOLEY CATH W/STRAP (MISCELLANEOUS) ×3 IMPLANT
IRRIGATION STRYKERFLOW (MISCELLANEOUS) IMPLANT
IRRIGATOR STRYKERFLOW (MISCELLANEOUS) ×3
NEEDLE HYPO 22GX1.5 SAFETY (NEEDLE) ×3 IMPLANT
NS IRRIG 1000ML POUR BTL (IV SOLUTION) ×3 IMPLANT
NS IRRIG 500ML POUR BTL (IV SOLUTION) ×4 IMPLANT
PACK COLON CLEAN CLOSURE (MISCELLANEOUS) ×3 IMPLANT
PACK LAP CHOLECYSTECTOMY (MISCELLANEOUS) ×3 IMPLANT
PENCIL ELECTRO HAND CTR (MISCELLANEOUS) ×3 IMPLANT
RELOAD STAPLE 60 2.6 WHT THN (STAPLE) ×2 IMPLANT
RELOAD STAPLE 60 3.6 BLU REG (STAPLE) ×2 IMPLANT
RELOAD STAPLER BLUE 60MM (STAPLE) ×2 IMPLANT
RELOAD STAPLER WHITE 60MM (STAPLE) ×2 IMPLANT
RETRACTOR WOUND ALXS 18CM MED (MISCELLANEOUS) ×1 IMPLANT
RTRCTR WOUND ALEXIS O 18CM MED (MISCELLANEOUS) ×3
SHEARS HARMONIC ACE PLUS 36CM (ENDOMECHANICALS) ×3 IMPLANT
SLEEVE ENDOPATH XCEL 5M (ENDOMECHANICALS) IMPLANT
SPONGE KITTNER 5P (MISCELLANEOUS) ×2 IMPLANT
SPONGE LAP 18X18 RF (DISPOSABLE) ×6 IMPLANT
SPONGE LAP 18X36 RFD (DISPOSABLE) ×3 IMPLANT
STAPLE ECHEON FLEX 60 POW ENDO (STAPLE) ×3 IMPLANT
STAPLER CIRCULAR 29MM (STAPLE) ×2 IMPLANT
STAPLER RELOAD BLUE 60MM (STAPLE) ×6
STAPLER RELOAD WHITE 60MM (STAPLE) ×6
SUCTION POOLE HANDLE (INSTRUMENTS) ×3
SUT MNCRL 4-0 (SUTURE) ×4
SUT MNCRL 4-0 27XMFL (SUTURE) ×2
SUT PDS AB 0 CT1 27 (SUTURE) ×6 IMPLANT
SUT SILK 2 0 (SUTURE) ×2
SUT SILK 2 0 SH CR/8 (SUTURE) ×3 IMPLANT
SUT SILK 2-0 30XBRD TIE 12 (SUTURE) ×1 IMPLANT
SUT VICRYL 0 AB UR-6 (SUTURE) ×6 IMPLANT
SUTURE MNCRL 4-0 27XMF (SUTURE) ×2 IMPLANT
SYR 30ML LL (SYRINGE) ×3 IMPLANT
SYRINGE IRR TOOMEY STRL 70CC (SYRINGE) ×2 IMPLANT
SYS LAPSCP GELPORT 120MM (MISCELLANEOUS) ×3
SYSTEM LAPSCP GELPORT 120MM (MISCELLANEOUS) ×1 IMPLANT
TRAY FOLEY MTR SLVR 16FR STAT (SET/KITS/TRAYS/PACK) ×3 IMPLANT
TROCAR XCEL 12X100 BLDLESS (ENDOMECHANICALS) ×3 IMPLANT
TROCAR XCEL BLUNT TIP 100MML (ENDOMECHANICALS) ×3 IMPLANT
TROCAR XCEL NON-BLD 5MMX100MML (ENDOMECHANICALS) ×3 IMPLANT
TUBING INSUF HEATED (TUBING) ×6 IMPLANT

## 2018-07-29 NOTE — Anesthesia Procedure Notes (Signed)
Procedure Name: Intubation Date/Time: 07/29/2018 11:57 AM Performed by: Jonna Clark, CRNA Pre-anesthesia Checklist: Patient identified, Patient being monitored, Timeout performed, Emergency Drugs available and Suction available Patient Re-evaluated:Patient Re-evaluated prior to induction Oxygen Delivery Method: Circle system utilized Preoxygenation: Pre-oxygenation with 100% oxygen Induction Type: IV induction Ventilation: Mask ventilation without difficulty Laryngoscope Size: Mac and 3 Grade View: Grade I Tube type: Oral Tube size: 7.0 mm Number of attempts: 1 Airway Equipment and Method: Stylet Placement Confirmation: ETT inserted through vocal cords under direct vision,  positive ETCO2 and breath sounds checked- equal and bilateral Secured at: 21 cm Tube secured with: Tape Dental Injury: Teeth and Oropharynx as per pre-operative assessment

## 2018-07-29 NOTE — Transfer of Care (Signed)
Immediate Anesthesia Transfer of Care Note  Patient: Whitney Cooper  Procedure(s) Performed: LAPAROSCOPIC SIGMOID COLECTOMY (N/A Abdomen)  Patient Location: PACU  Anesthesia Type:General  Level of Consciousness: drowsy  Airway & Oxygen Therapy: Patient Spontanous Breathing and Patient connected to face mask oxygen  Post-op Assessment: Report given to RN and Post -op Vital signs reviewed and stable  Post vital signs: stable  Last Vitals:  Vitals Value Taken Time  BP 130/75 07/29/2018  4:15 PM  Temp 36.2 C 07/29/2018  4:15 PM  Pulse 99 07/29/2018  4:22 PM  Resp 17 07/29/2018  4:22 PM  SpO2 100 % 07/29/2018  4:22 PM  Vitals shown include unvalidated device data.  Last Pain:  Vitals:   07/29/18 1116  TempSrc: Tympanic  PainSc: 0-No pain         Complications: No apparent anesthesia complications

## 2018-07-29 NOTE — Anesthesia Postprocedure Evaluation (Signed)
Anesthesia Post Note  Patient: Whitney Cooper  Procedure(s) Performed: LAPAROSCOPIC SIGMOID COLECTOMY (N/A Abdomen)  Patient location during evaluation: PACU Anesthesia Type: General Level of consciousness: awake and alert Pain management: pain level controlled Vital Signs Assessment: post-procedure vital signs reviewed and stable Respiratory status: spontaneous breathing and respiratory function stable Cardiovascular status: stable Anesthetic complications: no     Last Vitals:  Vitals:   07/29/18 1615 07/29/18 1630  BP: 130/75 138/77  Pulse: 87 88  Resp: 12 13  Temp: (!) 36.2 C   SpO2: 100% 100%    Last Pain:  Vitals:   07/29/18 1630  TempSrc:   PainSc: Asleep                 Aneri Slagel K

## 2018-07-29 NOTE — Op Note (Signed)
PROCEDURES: 1. Laparoscopic lysis of adhesions taking at least 60 minutes of total operative time 2. Laparoscopic low anterior resection  3. Laparoscopic takedown of splenic flexure   Pre-operative Diagnosis: Recurrent Diverticulitis  Post-operative Diagnosis: same  Surgeon: Jules Husbands   Assistants: Dr Genevive Bi Required due to the complexity of the case, exposure and anastomosis.  Anesthesia: General endotracheal anesthesia  ASA Class: 2   Surgeon: Caroleen Hamman , MD FACS  Anesthesia: Gen. with endotracheal tube   Findings: Chronic diverticulitis with thick adhesions from the Sb to the abdominal wall, sigmoid to the pelvic wall and sigmoid to the bladder Tension free anastomosis, good perfusion and with negative leak test.  Estimated Blood Loss: 75cc         Drains: none       Complications: none               Condition: stable  Procedure Details  The patient was seen again in the Holding Room. The benefits, complications, treatment options, and expected outcomes were discussed with the patient. The risks of bleeding, infection, recurrence of symptoms, failure to resolve symptoms,  bowel injury, any of which could require further surgery were reviewed with the patient.   The patient was taken to Operating Room, identified as Whitney Cooper and the procedure verified.  A Time Out was held and the above information confirmed.   Prior to the induction of general anesthesia, antibiotic prophylaxis was administered. VTE prophylaxis was in place. General endotracheal anesthesia was then administered and tolerated well. After the induction, the abdomen was prepped with Chloraprep and draped in the sterile fashion. The patient was positioned in lithotomy position. 7 cm incision was created as a midline mini laparotomy. The abdominal cavity was entered under direct visualization and the GelPort device was placed. A 5 mm port was placed in the suprapubic area under direct visualization  and pneumoperitoneum was obtained. There were dense adhesions from the omentum to the abdominal wall that where lysed in the standard fashion with the Harmonic scalpel. We also were able to place a 12 mm port in the right lower quadrant and a 5 mm port in the left lower quadrant under direct visualization. There was significant adhesive disease in the pelvis from the sigmoid to the pelvic wall and also from the sigmoid to the bladder. These adhesions were lysed with a combination of finger fracturing and Harmonic scalpel. The white line of Toldt was identified and divided and we mobilized the descending colon IN a lateral to medial fashion. We preserved the ureter at all times. We were also able to mobilize the splenic flexure using Harmonic scalpel in the standard fashion. We identified the takeoff of the inferior mesenteric artery dissected the pedicle and divided using a 60 mm vascular echelon stapler in the standard fashion. The rectum was mobilized from the pelvis in the standard fashion with the harmonic dissector. Using the Harmonic's scalpel were able to divide the mesorectum and and also divided proximal to the mesentery of the descending colon. Once we have an adequate visualization and mobilization we divided the sigmoid colon distally at the junction of the rectosigmoid area with multiple blue loads using the echelon stapler. Wel removed the GelPort and visualized the colon in a direct fashion. An divided at the mid descending colon with standard 60 mm blue load. We opened the stopped and measure the diameter of the bowel. A 29 mm dilator was perfect size. A pursestring was used after inserting the anvil  device. Dr. Genevive Bi was able to pass a 29 mm standard EEA stapler device through the anus.  Under direct visualization we perform an end to end anastomosis with the EEA device. A leak test was performed inflating the colon with a Toomey syringe and a rubber catheter. No evidence of leak was observed.  There was also adequate hemostasis.   All the laparoscopic ports were removed and a second look showed no evidence of any bleeding or any other injuries. We changed gloves and place a new tray to close the abdomen with a 0 PDS suture in a running fashion and the skin was closed with 4-0 Monocryl. Liposomal Marcaine was injected on all incision sites under direct visualization. Dermabond was used to coat all the skin incisions. Needle and laparotomy count were correct and there were no immediate occasions  Caroleen Hamman, MD, FACS

## 2018-07-29 NOTE — Interval H&P Note (Signed)
History and Physical Interval Note:  07/29/2018 11:06 AM  Whitney Cooper  has presented today for surgery, with the diagnosis of DIVERTICULITIS  The various methods of treatment have been discussed with the patient and family. After consideration of risks, benefits and other options for treatment, the patient has consented to  Procedure(s): LAPAROSCOPIC SIGMOID COLECTOMY (N/A) as a surgical intervention .  The patient's history has been reviewed, patient examined, no change in status, stable for surgery.  I have reviewed the patient's chart and labs.  Questions were answered to the patient's satisfaction.     [place]

## 2018-07-29 NOTE — Anesthesia Preprocedure Evaluation (Signed)
Anesthesia Evaluation  Patient identified by MRN, date of birth, ID band Patient awake    Reviewed: Allergy & Precautions, H&P , NPO status , Patient's Chart, lab work & pertinent test results, reviewed documented beta blocker date and time   Airway Mallampati: II  TM Distance: >3 FB Neck ROM: full    Dental  (+) Teeth Intact   Pulmonary neg pulmonary ROS,    Pulmonary exam normal        Cardiovascular Exercise Tolerance: Good hypertension, On Medications Normal cardiovascular exam+ dysrhythmias  Rhythm:regular Rate:Normal     Neuro/Psych  Headaches,  Neuromuscular disease negative psych ROS   GI/Hepatic Neg liver ROS, GERD  Medicated,  Endo/Other  negative endocrine ROS  Renal/GU negative Renal ROS  negative genitourinary   Musculoskeletal   Abdominal   Peds  Hematology negative hematology ROS (+)   Anesthesia Other Findings Past Medical History: 06/21/2015: Beat, premature ventricular No date: Bundle branch block     Comment:  palpitations a few weeks ago 07/07/2009: Diverticulitis of colon No date: Dry eyes 06/21/2015: Essential (primary) hypertension No date: Fibromyalgia No date: GERD (gastroesophageal reflux disease) No date: Headache     Comment:  migraine No date: History of chicken pox No date: History of kidney stones No date: Hyperlipidemia No date: Hypertension 01/24/2015: IFG (impaired fasting glucose) No date: OAB (overactive bladder) No date: Osteoarthritis 02/10/2016: Osteopenia determined by x-ray No date: Recurrent UTI No date: Urinary incontinence No date: Vertigo Past Surgical History: 11/11/2015: COLONOSCOPY WITH PROPOFOL; N/A     Comment:  Procedure: COLONOSCOPY WITH PROPOFOL;  Surgeon: Lollie Sails, MD;  Location: [facility] ENDOSCOPY;  Service:               Endoscopy;  Laterality: N/A; 06/24/2018: COLONOSCOPY WITH PROPOFOL; N/A     Comment:  Procedure: COLONOSCOPY  WITH PROPOFOL;  Surgeon:               Lollie Sails, MD;  Location: [facility] ENDOSCOPY;                Service: Endoscopy;  Laterality: N/A; 11/13/2013: ESOPHAGOGASTRODUODENOSCOPY No date: OOPHORECTOMY Jan 2012: TOTAL ABDOMINAL HYSTERECTOMY     Comment:  Complete, due to heavy bleeding and grape-fruit size               fibroid    Reproductive/Obstetrics negative OB ROS                             Anesthesia Physical Anesthesia Plan  ASA: II  Anesthesia Plan: General ETT   Post-op Pain Management:    Induction:   PONV Risk Score and Plan: 4 or greater  Airway Management Planned:   Additional Equipment:   Intra-op Plan:   Post-operative Plan:   Informed Consent: I have reviewed the patients History and Physical, chart, labs and discussed the procedure including the risks, benefits and alternatives for the proposed anesthesia with the patient or authorized representative who has indicated his/her understanding and acceptance.   Dental Advisory Given  Plan Discussed with: CRNA  Anesthesia Plan Comments:         Anesthesia Quick Evaluation

## 2018-07-29 NOTE — Anesthesia Post-op Follow-up Note (Signed)
Anesthesia QCDR form completed.        

## 2018-07-30 ENCOUNTER — Encounter: Payer: Self-pay | Admitting: Surgery

## 2018-07-30 LAB — CBC
HCT: 37 % (ref 36.0–46.0)
Hemoglobin: 12.5 g/dL (ref 12.0–15.0)
MCH: 29.1 pg (ref 26.0–34.0)
MCHC: 33.8 g/dL (ref 30.0–36.0)
MCV: 86.2 fL (ref 80.0–100.0)
NRBC: 0 % (ref 0.0–0.2)
Platelets: 276 10*3/uL (ref 150–400)
RBC: 4.29 MIL/uL (ref 3.87–5.11)
RDW: 12.7 % (ref 11.5–15.5)
WBC: 16.7 10*3/uL — ABNORMAL HIGH (ref 4.0–10.5)

## 2018-07-30 LAB — MAGNESIUM: Magnesium: 1.7 mg/dL (ref 1.7–2.4)

## 2018-07-30 LAB — BASIC METABOLIC PANEL
Anion gap: 7 (ref 5–15)
BUN: <5 mg/dL — ABNORMAL LOW (ref 6–20)
CO2: 23 mmol/L (ref 22–32)
Calcium: 8.6 mg/dL — ABNORMAL LOW (ref 8.9–10.3)
Chloride: 106 mmol/L (ref 98–111)
Creatinine, Ser: 0.65 mg/dL (ref 0.44–1.00)
GFR calc Af Amer: >60 mL/min (ref >60)
Glucose, Bld: 129 mg/dL — ABNORMAL HIGH (ref 70–99)
POTASSIUM: 3.8 mmol/L (ref 3.5–5.1)
Sodium: 136 mmol/L (ref 135–145)

## 2018-07-30 LAB — PHOSPHORUS: Phosphorus: 3.9 mg/dL (ref 2.5–4.6)

## 2018-07-30 MED ORDER — FLUCONAZOLE 50 MG PO TABS
150.0000 mg | ORAL_TABLET | Freq: Once | ORAL | Status: AC
  Administered 2018-08-03: 150 mg via ORAL
  Filled 2018-07-30 (×2): qty 1

## 2018-07-30 NOTE — Care Management (Signed)
RNCM consult for home health needs.  PT has evaluated and does not recommend any home health follow up or needs for equipment.   At this time no skilled nursing needs identified. RNCM to follow for any needs

## 2018-07-30 NOTE — Progress Notes (Signed)
[facility] Hospital Day(s): 1.   Post op day(s): 1 Day Post-Op.   Interval History: Patient seen and examined, no acute events or new complaints overnight. Patient reports that she has had diffuse abdominal soreness worse on her right side. She denied any fever, chills, nausea, or emesis. She tolerated clear liquids but this did cause some cramping pains. She does report flatus but no BM. Has not mobilized yet.   Review of Systems:  Constitutional: denies fever, chills  Respiratory: denies any shortness of breath  Cardiovascular: denies chest pain or palpitations  Gastrointestinal: + abdominal pain, denied N/V, or diarrhea/and bowel function as per interval history Integumentary: denies any other rashes or skin discolorations except surgical incisions   Vital signs in last 24 hours: [min-max] current  Temp:  [97.1 F (36.2 C)-99.1 F (37.3 C)] 99.1 F (37.3 C) (01/08 0544) Pulse Rate:  [80-98] 90 (01/08 0544) Resp:  [11-20] 20 (01/08 0544) BP: (121-149)/(68-88) 121/68 (01/08 0544) SpO2:  [100 %] 100 % (01/08 0544) Weight:  [64 kg] 64 kg (01/07 1116)     Height: 5\' 4"  (162.6 cm) Weight: 64 kg BMI (Calculated): 24.19   Intake/Output this shift:  No intake/output data recorded.   Intake/Output last 2 shifts:  @IOLAST2SHIFTS @   Physical Exam:  Constitutional: alert, cooperative and no distress  Respiratory: breathing non-labored at rest  Gastrointestinal: soft, diffuse tenderness worse in LUQ, and non-distended Integumentary: laparoscopic and midline laparotomy incisions are all CDI, no surrounding erythema or drainage  Labs:  CBC Latest Ref Rng & Units 07/30/2018 07/29/2018 07/18/2018  WBC 4.0 - 10.5 K/uL 16.7(H) 19.2(H) 9.5  Hemoglobin 12.0 - 15.0 g/dL 12.5 13.9 14.7  Hematocrit 36.0 - 46.0 % 37.0 40.6 44.3  Platelets 150 - 400 K/uL 276 87(L) 296   CMP Latest Ref Rng & Units 07/30/2018 07/29/2018 07/18/2018  Glucose 70 - 99 mg/dL 129(H)  - -  BUN 6 - 20 mg/dL <5(L) - -  Creatinine 0.44 - 1.00 mg/dL 0.65 0.79 0.60  Sodium 135 - 145 mmol/L 136 - -  Potassium 3.5 - 5.1 mmol/L 3.8 - -  Chloride 98 - 111 mmol/L 106 - -  CO2 22 - 32 mmol/L 23 - -  Calcium 8.9 - 10.3 mg/dL 8.6(L) - -  Total Protein 6.5 - 8.1 g/dL - - -  Total Bilirubin 0.3 - 1.2 mg/dL - - -  Alkaline Phos 38 - 126 U/L - - -  AST 15 - 41 U/L - - -  ALT 0 - 44 U/L - - -    Assessment/Plan: 58 y.o. female with mild leukocytosis which may be reactive from surgery who is otherwise doing well overall 1 Day Post-Op s/p laparoscopic LAR for recurrent diverticulitis, complicated by pertinent comorbidities including HTN, HLD, GERD, and OA.   - Clear liquids + IVF  - Continue foley catheter  - Pain control PRN (minimize narcotics), antiemetics PRN  - Continue to monitor abdominal exam and on-going bowel function  - Medical management of comorbidities  - encourage mobilization, will engage PT if needed   - DVT prophylaxis    All of the above findings and recommendations were discussed with the patient, patient's family, and the medical team, and all of patient's and family's questions were answered to their expressed satisfaction.  -- Edison Simon, PA-C [facility] 07/30/2018, 10:00 AM [phone] M-F: 7am - 4pm

## 2018-07-30 NOTE — Evaluation (Signed)
Physical Therapy Evaluation Patient Details Name: Whitney Cooper MRN: [id number] DOB: September 11, 1960 Today's Date: 07/30/2018   History of Present Illness  Whitney Cooper is a 58 y/o F s/p laparoscopic sigmoid colectomy for recurrent diverticulitis.  Whitney Cooper's PMH includes vertigo, osteopenia, fibromyalgia.     Clinical Impression  Patient is s/p above surgery resulting in functional limitations due to the deficits listed below (see Whitney Cooper Problem List). Whitney Cooper was independent with all aspects of mobility and ADLs PTA.  She currently requires min guard assist for safety due to mild instability with OOB activity.  Anticipate that as Whitney Cooper increases her OOB activity she will quickly return to PLOF.  Encouraged Whitney Cooper to ambulate at least 3x/day with nursing staff.  Patient will benefit from skilled Whitney Cooper to increase their independence and safety with mobility to allow discharge to the venue listed below.      Follow Up Recommendations No Whitney Cooper follow up    Equipment Recommendations  None recommended by Whitney Cooper    Recommendations for Other Services       Precautions / Restrictions Precautions Precautions: Fall;Other (comment) Precaution Comments: abdominal incision Restrictions Weight Bearing Restrictions: No      Mobility  Bed Mobility Overal bed mobility: Needs Assistance Bed Mobility: Supine to Sit     Supine to sit: Min guard     General bed mobility comments: Cues for log roll technique.  Increased time and effort due to pain  Transfers Overall transfer level: Needs assistance Equipment used: None Transfers: Sit to/from Stand Sit to Stand: Min guard         General transfer comment: For initial stand from bed Whitney Cooper reaches out for IV pole to steady due to unsteadiness.  Whitney Cooper uses railing to assist with sit>stand from toilet.   Ambulation/Gait Ambulation/Gait assistance: Min guard Gait Distance (Feet): 40 Feet Assistive device: IV Pole;None Gait Pattern/deviations: Step-through pattern;Decreased step length  - right;Decreased step length - left Gait velocity: decreased   General Gait Details: Whitney Cooper held onto IV pole on the way to the bathroom (Whitney Cooper saying, "I want to hold onto something") due to mild unsteadiness.  No AD or IV pole support required to ambulate back to chair with improved stability.   Stairs            Wheelchair Mobility    Modified Rankin (Stroke Patients Only)       Balance Overall balance assessment: Needs assistance Sitting-balance support: No upper extremity supported;Feet supported Sitting balance-Leahy Scale: Good     Standing balance support: No upper extremity supported;During functional activity Standing balance-Leahy Scale: Fair Standing balance comment: Whitney Cooper likely would lose her balance with perturbation                             Pertinent Vitals/Pain Pain Assessment: Faces Faces Pain Scale: Hurts even more Pain Location: surgical site Pain Descriptors / Indicators: Discomfort;Grimacing;Guarding Pain Intervention(s): Limited activity within patient's tolerance;Monitored during session;Repositioned;Utilized relaxation techniques    Home Living Family/patient expects to be discharged to:: Private residence Living Arrangements: Spouse/significant other;Children(son and husband) Available Help at Discharge: Family;Available PRN/intermittently(mother lives nearby as well) Type of Home: House Home Access: Stairs to enter [employer]: (work table that Whitney Cooper can hold onto ) Technical brewer of Steps: 3 Home Layout: Two level;Able to live on main level with bedroom/bathroom Home Equipment: None(her mother might have a cane that she can borrow if needed)      Prior Function Level of Independence:  Independent         Comments: 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  No falls in the past 6 months.  Ambulates without AD.  Ind with ADLs.  Still driving.      Hand Dominance        Extremity/Trunk Assessment   Upper  Extremity Assessment Upper Extremity Assessment: Overall WFL for tasks assessed    Lower Extremity Assessment Lower Extremity Assessment: Overall WFL for tasks assessed    Cervical / Trunk Assessment Cervical / Trunk Assessment: Other exceptions Cervical / Trunk Exceptions: s/p colectomy  Communication   Communication: No difficulties  Cognition Arousal/Alertness: Awake/alert Behavior During Therapy: WFL for tasks assessed/performed Overall Cognitive Status: Within Functional Limits for tasks assessed                                        General Comments General comments (skin integrity, edema, etc.): Encouraged Whitney Cooper to acquire a cane if her mild unsteadiness continues for safety at d/c.  Anticipate that stability will continue to improve with increased time OOB. Whitney Cooper with bowel incontinence while ambulating in room which is new for her, blood in her stool as well (diarrhea).  RN notified.    Exercises Other Exercises Other Exercises: Encouraged Whitney Cooper to sit and stand upright to allow for proper healing of surgical site.   Other Exercises: Encouraged Whitney Cooper to ambulate at least 3x/day with nursing staff    Assessment/Plan    Whitney Cooper Assessment Patient needs continued Whitney Cooper services  Whitney Cooper Problem List Decreased balance;Decreased safety awareness;Decreased knowledge of use of DME;Pain       Whitney Cooper Treatment Interventions DME instruction;Gait training;Stair training;Functional mobility training;Therapeutic activities;Therapeutic exercise;Balance training;Neuromuscular re-education;Patient/family education    Whitney Cooper Goals (Current goals can be found in the Care Plan section)  Acute Rehab Whitney Cooper Goals Patient Stated Goal: to return to PLOF Whitney Cooper Goal Formulation: With patient Time For Goal Achievement: 08/13/18 Potential to Achieve Goals: Good    Frequency Min 2X/week   Barriers to discharge        Co-evaluation               AM-PAC Whitney Cooper "6 Clicks" Mobility  Outcome Measure Help  needed turning from your back to your side while in a flat bed without using bedrails?: A Little Help needed moving from lying on your back to sitting on the side of a flat bed without using bedrails?: A Little Help needed moving to and from a bed to a chair (including a wheelchair)?: A Little Help needed standing up from a chair using your arms (e.g., wheelchair or bedside chair)?: A Little Help needed to walk in hospital room?: A Little Help needed climbing 3-5 steps with a railing? : A Little 6 Click Score: 18    End of Session Equipment Utilized During Treatment: Gait belt Activity Tolerance: Patient tolerated treatment well Patient left: in chair;with call bell/phone within reach;with chair alarm set;with family/visitor present Nurse Communication: Mobility status;Other (comment)(blood in stool, new bowel incontinence) Whitney Cooper Visit Diagnosis: Unsteadiness on feet (R26.81)    Time: 0962-8366 Whitney Cooper Time Calculation (min) (ACUTE ONLY): 44 min   Charges:   Whitney Cooper Evaluation $Whitney Cooper Eval Low Complexity: 1 Low Whitney Cooper Treatments $Gait Training: 8-22 mins $Therapeutic Activity: 8-22 mins        Collie Siad Whitney Cooper, DPT 07/30/2018, 3:50 PM

## 2018-07-31 ENCOUNTER — Inpatient Hospital Stay: Payer: 59

## 2018-07-31 LAB — CBC
HCT: 35.1 % — ABNORMAL LOW (ref 36.0–46.0)
Hemoglobin: 11.5 g/dL — ABNORMAL LOW (ref 12.0–15.0)
MCH: 29 pg (ref 26.0–34.0)
MCHC: 32.8 g/dL (ref 30.0–36.0)
MCV: 88.6 fL (ref 80.0–100.0)
Platelets: 208 10*3/uL (ref 150–400)
RBC: 3.96 MIL/uL (ref 3.87–5.11)
RDW: 13 % (ref 11.5–15.5)
WBC: 21.5 10*3/uL — ABNORMAL HIGH (ref 4.0–10.5)
nRBC: 0 % (ref 0.0–0.2)

## 2018-07-31 LAB — SURGICAL PATHOLOGY

## 2018-07-31 LAB — HIV ANTIBODY (ROUTINE TESTING W REFLEX): HIV Screen 4th Generation wRfx: NONREACTIVE

## 2018-07-31 MED ORDER — CYCLOBENZAPRINE HCL 10 MG PO TABS
5.0000 mg | ORAL_TABLET | Freq: Three times a day (TID) | ORAL | Status: DC
  Administered 2018-08-01 – 2018-08-07 (×18): 5 mg via ORAL
  Filled 2018-07-31 (×18): qty 1

## 2018-07-31 MED ORDER — PIPERACILLIN-TAZOBACTAM 3.375 G IVPB
3.3750 g | Freq: Three times a day (TID) | INTRAVENOUS | Status: DC
  Administered 2018-07-31 – 2018-08-07 (×19): 3.375 g via INTRAVENOUS
  Filled 2018-07-31 (×21): qty 50

## 2018-07-31 MED ORDER — IOPAMIDOL (ISOVUE-300) INJECTION 61%: 200.0000 mL | Freq: Once | INTRAVENOUS | Status: DC | PRN

## 2018-07-31 MED ORDER — ALBUMIN HUMAN 5 % IV SOLN
25.0000 g | Freq: Once | INTRAVENOUS | Status: AC
  Administered 2018-07-31: 25 g via INTRAVENOUS
  Filled 2018-07-31: qty 500

## 2018-07-31 MED ORDER — DEXTROSE-NACL 5-0.9 % IV SOLN
INTRAVENOUS | Status: DC
  Administered 2018-07-31 – 2018-08-02 (×4): via INTRAVENOUS

## 2018-07-31 NOTE — Progress Notes (Signed)
[facility] Hospital Day(s): 2.   Post op day(s): 2 Days Post-Op.   Interval History: Patient seen and examined, patient was febrile to 101.4 overnight and endorse lower abdominal pain with the fever. She is feeling better this morning but still has mild lower abdominal pain which is improved. No complaints of nausea or emesis. She was tolerating clear liquids yesterday but is NPO this morning. Mobilized yesterday.   Review of Systems:  Constitutional: + fever, denied chills  Respiratory: denies any shortness of breath  Cardiovascular: denies chest pain or palpitations  Gastrointestinal: + abdominal pain, denied N/V, or diarrhea/and bowel function as per interval history Integumentary: denies any other rashes or skin discolorations except surgical incisions   Vital signs in last 24 hours: [min-max] current  Temp:  [97.9 F (36.6 C)-101.4 F (38.6 C)] 98.1 F (36.7 C) (01/09 0530) Pulse Rate:  [76-115] 100 (01/09 0530) Resp:  [18-26] 26 (01/09 0530) BP: (102-118)/(55-65) 102/55 (01/09 0530) SpO2:  [97 %-100 %] 100 % (01/09 0530)     Height: 5\' 4"  (162.6 cm) Weight: 64 kg BMI (Calculated): 24.19   Intake/Output this shift:  No intake/output data recorded.   Intake/Output last 2 shifts:  @IOLAST2SHIFTS @   Physical Exam:  Physical Exam:  Constitutional: alert, cooperative and no distress  Respiratory: breathing non-labored at rest  Gastrointestinal: soft, diffuse tenderness worse in RLQ, and non-distended Integumentary: laparoscopic and midline laparotomy incisions are all CDI, no surrounding erythema or drainage   Labs:  CBC Latest Ref Rng & Units 07/31/2018 07/30/2018 07/29/2018  WBC 4.0 - 10.5 K/uL 21.5(H) 16.7(H) 19.2(H)  Hemoglobin 12.0 - 15.0 g/dL 11.5(L) 12.5 13.9  Hematocrit 36.0 - 46.0 % 35.1(L) 37.0 40.6  Platelets 150 - 400 K/uL 208 276 87(L)   CMP Latest Ref Rng & Units 07/30/2018 07/29/2018 07/18/2018  Glucose 70 - 99 mg/dL  129(H) - -  BUN 6 - 20 mg/dL <5(L) - -  Creatinine 0.44 - 1.00 mg/dL 0.65 0.79 0.60  Sodium 135 - 145 mmol/L 136 - -  Potassium 3.5 - 5.1 mmol/L 3.8 - -  Chloride 98 - 111 mmol/L 106 - -  CO2 22 - 32 mmol/L 23 - -  Calcium 8.9 - 10.3 mg/dL 8.6(L) - -  Total Protein 6.5 - 8.1 g/dL - - -  Total Bilirubin 0.3 - 1.2 mg/dL - - -  Alkaline Phos 38 - 126 U/L - - -  AST 15 - 41 U/L - - -  ALT 0 - 44 U/L - - -     Assessment/Plan: 58 y.o. female with worsening leukocytosis and fever overnight which is concerning for potential post-operative complications including anastomotic leak who is 2 Days Post-Op s/p laparoscopic LAR for recurrent diverticulitis, complicated by pertinent comorbidities including HTN, HLD, GERD, and OA   - NPO (while ruling out leak) + IVF   - Will get gastrografin enema and imaging to rule out anastomotic leak  - Continue foley catheter             - Pain control PRN (minimize narcotics), antiemetics PRN             - Continue to monitor abdominal exam and on-going bowel function             - Medical management of comorbidities             - encourage mobilization              - DVT  prophylaxis   All of the above findings and recommendations were discussed with the patient, and the medical team, and all of patient's questions were answered to her expressed satisfaction.  -- Edison Simon, PA-C [facility] 07/31/2018, 8:18 AM [phone] M-F: 7am - 4pm

## 2018-08-01 LAB — CBC
HCT: 31.3 % — ABNORMAL LOW (ref 36.0–46.0)
Hemoglobin: 10.6 g/dL — ABNORMAL LOW (ref 12.0–15.0)
MCH: 29 pg (ref 26.0–34.0)
MCHC: 33.9 g/dL (ref 30.0–36.0)
MCV: 85.5 fL (ref 80.0–100.0)
Platelets: 171 10*3/uL (ref 150–400)
RBC: 3.66 MIL/uL — AB (ref 3.87–5.11)
RDW: 13.2 % (ref 11.5–15.5)
WBC: 15.7 10*3/uL — ABNORMAL HIGH (ref 4.0–10.5)
nRBC: 0 % (ref 0.0–0.2)

## 2018-08-01 LAB — BASIC METABOLIC PANEL
Anion gap: 7 (ref 5–15)
BUN: <5 mg/dL — ABNORMAL LOW (ref 6–20)
CO2: 22 mmol/L (ref 22–32)
Calcium: 7.9 mg/dL — ABNORMAL LOW (ref 8.9–10.3)
Chloride: 111 mmol/L (ref 98–111)
Creatinine, Ser: 0.58 mg/dL (ref 0.44–1.00)
GFR calc Af Amer: >60 mL/min (ref >60)
GFR calc non Af Amer: >60 mL/min (ref >60)
Glucose, Bld: 110 mg/dL — ABNORMAL HIGH (ref 70–99)
POTASSIUM: 3.9 mmol/L (ref 3.5–5.1)
Sodium: 140 mmol/L (ref 135–145)

## 2018-08-01 NOTE — Progress Notes (Signed)
[place] Hospital Day(s): 3.   Post op day(s): 3 Days Post-Op.   Interval History: Patient seen and examined, no acute events or new complaints overnight. Patient reports that she feels her abdominal pain continues to improve and she notices discomfort worse in the RLQ. No fevers, chills, nausea, or emesis. She has been able to mobilize. Endorses flatus and BM.   Review of Systems:  Constitutional: denies fever, chills  Gastrointestinal: + abdominal pain, denied N/V, or diarrhea/and bowel function as per interval history Musculoskeletal: denies pain, decreased motor or sensation Integumentary: denies any other rashes or skin discolorations except surgical incisions.  Vital signs in last 24 hours: [min-max] current  Temp:  [98.7 F (37.1 C)-98.9 F (37.2 C)] 98.9 F (37.2 C) (01/10 0417) Pulse Rate:  [107-122] 107 (01/10 0417) Resp:  [18-22] 20 (01/10 0417) BP: (114-135)/(62-64) 123/63 (01/10 0417) SpO2:  [97 %-100 %] 98 % (01/10 0417)     Height: 5\' 4"  (162.6 cm) Weight: 64 kg BMI (Calculated): 24.19   Intake/Output this shift:  No intake/output data recorded.   Intake/Output last 2 shifts:  @IOLAST2SHIFTS @   Physical Exam:  Constitutional: alert, cooperative and no distress  Respiratory: breathing non-labored at rest  Gastrointestinal: soft, diffuse tenderness worse in RLQ, and non-distended Integumentary: laparoscopic and midline laparotomy incisions are all CDI, no surrounding erythema or drainage   Labs:  CBC Latest Ref Rng & Units 08/01/2018 07/31/2018 07/30/2018  WBC 4.0 - 10.5 K/uL 15.7(H) 21.5(H) 16.7(H)  Hemoglobin 12.0 - 15.0 g/dL 10.6(L) 11.5(L) 12.5  Hematocrit 36.0 - 46.0 % 31.3(L) 35.1(L) 37.0  Platelets 150 - 400 K/uL 171 208 276   CMP Latest Ref Rng & Units 08/01/2018 07/30/2018 07/29/2018  Glucose 70 - 99 mg/dL 110(H) 129(H) -  BUN 6 - 20 mg/dL <5(L) <5(L) -  Creatinine 0.44 - 1.00 mg/dL 0.58 0.65 0.79  Sodium 135 -  145 mmol/L 140 136 -  Potassium 3.5 - 5.1 mmol/L 3.9 3.8 -  Chloride 98 - 111 mmol/L 111 106 -  CO2 22 - 32 mmol/L 22 23 -  Calcium 8.9 - 10.3 mg/dL 7.9(L) 8.6(L) -  Total Protein 6.5 - 8.1 g/dL - - -  Total Bilirubin 0.3 - 1.2 mg/dL - - -  Alkaline Phos 38 - 126 U/L - - -  AST 15 - 41 U/L - - -  ALT 0 - 44 U/L - - -     Assessment/Plan:  58 y.o. female with who appears improved this morning without fever and improving leukocytosis with possible contained small anastomotic leak appreciated on imaging 01/09 who is  3 Days Post-Op s/p laparoscopic LAR for recurrent diverticulitis, complicated by pertinent comorbidities including HTN, HLD, GERD, and OA.   - Continue NPO (okay for sips with PO medications) + IVF  - Discontinue foley catheter  - Pain control PRN (minimize narcotics), antiemetics PRN - Continue to monitor abdominal exam and on-going bowel function - Medical management of comorbidities - encourage mobilization - DVT prophylaxis  All of the above findings and recommendations were discussed with the patient, and the medical team, and all of patient's questions were answered to her expressed satisfaction.  -- Edison Simon, PA-C [facility] 08/01/2018, 9:32 AM [phone] M-F: 7am - 4pm

## 2018-08-02 LAB — BASIC METABOLIC PANEL
Anion gap: 4 — ABNORMAL LOW (ref 5–15)
BUN: 6 mg/dL (ref 6–20)
CO2: 25 mmol/L (ref 22–32)
CREATININE: 0.58 mg/dL (ref 0.44–1.00)
Calcium: 7.9 mg/dL — ABNORMAL LOW (ref 8.9–10.3)
Chloride: 113 mmol/L — ABNORMAL HIGH (ref 98–111)
GFR calc Af Amer: >60 mL/min (ref >60)
Glucose, Bld: 117 mg/dL — ABNORMAL HIGH (ref 70–99)
Potassium: 3.1 mmol/L — ABNORMAL LOW (ref 3.5–5.1)
Sodium: 142 mmol/L (ref 135–145)

## 2018-08-02 LAB — CBC
HCT: 29.8 % — ABNORMAL LOW (ref 36.0–46.0)
Hemoglobin: 9.5 g/dL — ABNORMAL LOW (ref 12.0–15.0)
MCH: 28.3 pg (ref 26.0–34.0)
MCHC: 31.9 g/dL (ref 30.0–36.0)
MCV: 88.7 fL (ref 80.0–100.0)
Platelets: 195 10*3/uL (ref 150–400)
RBC: 3.36 MIL/uL — ABNORMAL LOW (ref 3.87–5.11)
RDW: 13.3 % (ref 11.5–15.5)
WBC: 9.4 10*3/uL (ref 4.0–10.5)
nRBC: 0 % (ref 0.0–0.2)

## 2018-08-02 LAB — OCCULT BLOOD X 1 CARD TO LAB, STOOL: Fecal Occult Bld: POSITIVE — AB

## 2018-08-02 LAB — C DIFFICILE QUICK SCREEN W PCR REFLEX
C Diff antigen: NEGATIVE
C Diff interpretation: NOT DETECTED
C Diff toxin: NEGATIVE

## 2018-08-02 MED ORDER — POTASSIUM CHLORIDE CRYS ER 20 MEQ PO TBCR
40.0000 meq | EXTENDED_RELEASE_TABLET | Freq: Two times a day (BID) | ORAL | Status: DC
  Administered 2018-08-02 – 2018-08-05 (×6): 40 meq via ORAL
  Filled 2018-08-02 (×6): qty 2

## 2018-08-02 NOTE — Progress Notes (Signed)
POD # 4 Feeling much better, ambulated + BM and gas, Dark BM expected Labs reviewed wbc normalized, hb dropped from Hemodilution Tachy resolved AVSS Good UO She is hungry   PE NAD Abd: soft, incisions healing well, significant improvement in tenderness, minimal incisional tenderness. No peritonitis or infection  A/P Improving Advance to clears No surgical intervention F/U clinically may repeat CT depending on clinical picture

## 2018-08-03 ENCOUNTER — Inpatient Hospital Stay: Payer: 59 | Admitting: Anesthesiology

## 2018-08-03 ENCOUNTER — Encounter
Admission: RE | Disposition: A | Discharge status: Home / Self Care | Payer: Self-pay | Source: Ambulatory Visit | Attending: Surgery

## 2018-08-03 ENCOUNTER — Inpatient Hospital Stay: Payer: 59

## 2018-08-03 DIAGNOSIS — Z9049 Acquired absence of other specified parts of digestive tract: Secondary | ICD-10-CM

## 2018-08-03 HISTORY — PX: ILEOSTOMY: SHX1783

## 2018-08-03 HISTORY — PX: LAPAROTOMY: SHX154

## 2018-08-03 LAB — BASIC METABOLIC PANEL
Anion gap: 4 — ABNORMAL LOW (ref 5–15)
BUN: <5 mg/dL — ABNORMAL LOW (ref 6–20)
CO2: 26 mmol/L (ref 22–32)
Calcium: 8.2 mg/dL — ABNORMAL LOW (ref 8.9–10.3)
Chloride: 112 mmol/L — ABNORMAL HIGH (ref 98–111)
Creatinine, Ser: 0.56 mg/dL (ref 0.44–1.00)
GFR calc Af Amer: >60 mL/min (ref >60)
GFR calc non Af Amer: >60 mL/min (ref >60)
Glucose, Bld: 90 mg/dL (ref 70–99)
Potassium: 3.8 mmol/L (ref 3.5–5.1)
SODIUM: 142 mmol/L (ref 135–145)

## 2018-08-03 LAB — MRSA PCR SCREENING: MRSA BY PCR: NEGATIVE

## 2018-08-03 SURGERY — LAPAROTOMY, EXPLORATORY
Anesthesia: General

## 2018-08-03 MED ORDER — DEXAMETHASONE SODIUM PHOSPHATE 10 MG/ML IJ SOLN
INTRAMUSCULAR | Status: DC | PRN
  Administered 2018-08-03: 6 mg via INTRAVENOUS

## 2018-08-03 MED ORDER — ROCURONIUM BROMIDE 100 MG/10ML IV SOLN
INTRAVENOUS | Status: DC | PRN
  Administered 2018-08-03: 5 mg via INTRAVENOUS
  Administered 2018-08-03: 45 mg via INTRAVENOUS

## 2018-08-03 MED ORDER — LIDOCAINE HCL (PF) 2 % IJ SOLN
INTRAMUSCULAR | Status: AC
  Filled 2018-08-03: qty 10

## 2018-08-03 MED ORDER — FENTANYL CITRATE (PF) 100 MCG/2ML IJ SOLN
INTRAMUSCULAR | Status: DC | PRN
  Administered 2018-08-03 (×2): 50 ug via INTRAVENOUS
  Administered 2018-08-03: 100 ug via INTRAVENOUS

## 2018-08-03 MED ORDER — ONDANSETRON HCL 4 MG/2ML IJ SOLN
INTRAMUSCULAR | Status: DC | PRN
  Administered 2018-08-03: 4 mg via INTRAVENOUS

## 2018-08-03 MED ORDER — WITCH HAZEL-GLYCERIN EX PADS
MEDICATED_PAD | CUTANEOUS | Status: DC | PRN
  Filled 2018-08-03: qty 100

## 2018-08-03 MED ORDER — PROPOFOL 10 MG/ML IV BOLUS
INTRAVENOUS | Status: DC | PRN
  Administered 2018-08-03: 150 mg via INTRAVENOUS

## 2018-08-03 MED ORDER — SODIUM CHLORIDE 0.9 % IV SOLN
INTRAVENOUS | Status: DC
  Administered 2018-08-03 – 2018-08-05 (×5): via INTRAVENOUS

## 2018-08-03 MED ORDER — SUGAMMADEX SODIUM 200 MG/2ML IV SOLN
INTRAVENOUS | Status: DC | PRN
  Administered 2018-08-03: 130 mg via INTRAVENOUS

## 2018-08-03 MED ORDER — FENTANYL CITRATE (PF) 100 MCG/2ML IJ SOLN
25.0000 ug | INTRAMUSCULAR | Status: AC | PRN
  Administered 2018-08-03 – 2018-08-04 (×6): 25 ug via INTRAVENOUS

## 2018-08-03 MED ORDER — DEXAMETHASONE SODIUM PHOSPHATE 10 MG/ML IJ SOLN
INTRAMUSCULAR | Status: AC
  Filled 2018-08-03: qty 1

## 2018-08-03 MED ORDER — SUGAMMADEX SODIUM 200 MG/2ML IV SOLN
INTRAVENOUS | Status: AC
  Filled 2018-08-03: qty 2

## 2018-08-03 MED ORDER — SUCCINYLCHOLINE CHLORIDE 20 MG/ML IJ SOLN
INTRAMUSCULAR | Status: DC | PRN
  Administered 2018-08-03: 100 mg via INTRAVENOUS

## 2018-08-03 MED ORDER — DIPHENHYDRAMINE HCL 50 MG/ML IJ SOLN
INTRAMUSCULAR | Status: DC | PRN
  Administered 2018-08-03: 25 mg via INTRAVENOUS

## 2018-08-03 MED ORDER — LACTATED RINGERS IV SOLN
INTRAVENOUS | Status: DC | PRN
  Administered 2018-08-03: 22:00:00 via INTRAVENOUS

## 2018-08-03 MED ORDER — ONDANSETRON HCL 4 MG/2ML IJ SOLN: 4.0000 mg | Freq: Once | INTRAMUSCULAR | Status: DC | PRN

## 2018-08-03 MED ORDER — PROPOFOL 10 MG/ML IV BOLUS
INTRAVENOUS | Status: AC
  Filled 2018-08-03: qty 20

## 2018-08-03 MED ORDER — DIPHENHYDRAMINE HCL 50 MG/ML IJ SOLN
INTRAMUSCULAR | Status: AC
  Filled 2018-08-03: qty 1

## 2018-08-03 MED ORDER — FENTANYL CITRATE (PF) 100 MCG/2ML IJ SOLN
INTRAMUSCULAR | Status: AC
  Filled 2018-08-03: qty 2

## 2018-08-03 MED ORDER — BUPIVACAINE-EPINEPHRINE (PF) 0.25% -1:200000 IJ SOLN
INTRAMUSCULAR | Status: AC
  Filled 2018-08-03: qty 30

## 2018-08-03 MED ORDER — FENTANYL CITRATE (PF) 100 MCG/2ML IJ SOLN
INTRAMUSCULAR | Status: AC
  Administered 2018-08-03: 25 ug via INTRAVENOUS
  Filled 2018-08-03: qty 2

## 2018-08-03 MED ORDER — ONDANSETRON HCL 4 MG/2ML IJ SOLN
INTRAMUSCULAR | Status: AC
  Filled 2018-08-03: qty 2

## 2018-08-03 MED ORDER — LIDOCAINE HCL (CARDIAC) PF 100 MG/5ML IV SOSY
PREFILLED_SYRINGE | INTRAVENOUS | Status: DC | PRN
  Administered 2018-08-03: 100 mg via INTRAVENOUS

## 2018-08-03 MED ORDER — ROCURONIUM BROMIDE 50 MG/5ML IV SOLN
INTRAVENOUS | Status: AC
  Filled 2018-08-03: qty 1

## 2018-08-03 SURGICAL SUPPLY — 57 items
APPLIER CLIP 11 MED OPEN (CLIP)
APPLIER CLIP 13 LRG OPEN (CLIP)
BARRIER ADH SEPRAFILM 3INX5IN (MISCELLANEOUS) IMPLANT
BLADE CLIPPER SURG (BLADE) IMPLANT
BLADE SURG SZ10 CARB STEEL (BLADE) IMPLANT
BULB RESERV EVAC DRAIN JP 100C (MISCELLANEOUS) ×3 IMPLANT
CANISTER SUCT 3000ML PPV (MISCELLANEOUS) ×3 IMPLANT
CATH ROBINSON RED A/P 16FR (CATHETERS) ×3 IMPLANT
CHLORAPREP W/TINT 26ML (MISCELLANEOUS) ×3 IMPLANT
CLIP APPLIE 11 MED OPEN (CLIP) IMPLANT
CLIP APPLIE 13 LRG OPEN (CLIP) IMPLANT
COVER WAND RF STERILE (DRAPES) ×3 IMPLANT
DRAIN CHANNEL 19F RND (DRAIN) ×3 IMPLANT
DRAPE LAPAROTOMY 100X77 ABD (DRAPES) ×3 IMPLANT
DRAPE TABLE BACK 80X90 (DRAPES) IMPLANT
DRSG OPSITE POSTOP 4X8 (GAUZE/BANDAGES/DRESSINGS) ×3 IMPLANT
DRSG TEGADERM 2-3/8X2-3/4 SM (GAUZE/BANDAGES/DRESSINGS) IMPLANT
DRSG TEGADERM 4X4.75 (GAUZE/BANDAGES/DRESSINGS) ×3 IMPLANT
DRSG TELFA 3X8 NADH (GAUZE/BANDAGES/DRESSINGS) IMPLANT
ELECT BLADE 6.5 EXT (BLADE) ×3 IMPLANT
ELECT REM PT RETURN 9FT ADLT (ELECTROSURGICAL) ×3
ELECTRODE REM PT RTRN 9FT ADLT (ELECTROSURGICAL) ×1 IMPLANT
GAUZE PACKING IODOFORM 1/2 (PACKING) ×3 IMPLANT
GAUZE SPONGE 4X4 12PLY STRL (GAUZE/BANDAGES/DRESSINGS) IMPLANT
GLOVE BIO SURGEON STRL SZ7 (GLOVE) ×15 IMPLANT
GOWN STRL REUS W/ TWL LRG LVL3 (GOWN DISPOSABLE) ×3 IMPLANT
GOWN STRL REUS W/TWL LRG LVL3 (GOWN DISPOSABLE) ×6
HANDLE SUCTION POOLE (INSTRUMENTS) ×1 IMPLANT
HANDLE YANKAUER SUCT BULB TIP (MISCELLANEOUS) ×3 IMPLANT
KIT OSTOMY 2 PC DRNBL 2.25 STR (WOUND CARE) ×1 IMPLANT
KIT OSTOMY DRAINABLE 2.25 STR (WOUND CARE) ×2
LIGASURE IMPACT 36 18CM CVD LR (INSTRUMENTS) IMPLANT
NEEDLE HYPO 22GX1.5 SAFETY (NEEDLE) ×3 IMPLANT
NEEDLE HYPO 25X1 1.5 SAFETY (NEEDLE) ×3 IMPLANT
PACK BASIN MAJOR ARMC (MISCELLANEOUS) ×3 IMPLANT
RELOAD PROXIMATE 75MM BLUE (ENDOMECHANICALS) IMPLANT
SPONGE DRAIN TRACH 4X4 STRL 2S (GAUZE/BANDAGES/DRESSINGS) ×3 IMPLANT
SPONGE LAP 18X18 RF (DISPOSABLE) IMPLANT
SPONGE LAP 18X36 RFD (DISPOSABLE) IMPLANT
STAPLER PROXIMATE 75MM BLUE (STAPLE) IMPLANT
STAPLER SKIN PROX 35W (STAPLE) ×3 IMPLANT
SUCTION POOLE HANDLE (INSTRUMENTS) ×3
SUT ETHILON 3-0 FS-10 30 BLK (SUTURE) ×3
SUT PDS AB 0 CT1 27 (SUTURE) ×6 IMPLANT
SUT SILK 2 0 (SUTURE) ×2
SUT SILK 2 0 SH CR/8 (SUTURE) IMPLANT
SUT SILK 2 0SH CR/8 30 (SUTURE) ×3 IMPLANT
SUT SILK 2-0 18XBRD TIE 12 (SUTURE) ×1 IMPLANT
SUT VIC AB 0 CT1 36 (SUTURE) ×3 IMPLANT
SUT VIC AB 2-0 SH 27 (SUTURE)
SUT VIC AB 2-0 SH 27XBRD (SUTURE) IMPLANT
SUT VIC AB 3-0 SH 8-18 (SUTURE) ×3 IMPLANT
SUTURE EHLN 3-0 FS-10 30 BLK (SUTURE) ×1 IMPLANT
SYR 30ML LL (SYRINGE) ×3 IMPLANT
SYR 3ML LL SCALE MARK (SYRINGE) IMPLANT
TAPE MICROFOAM 4IN (TAPE) IMPLANT
TRAY FOLEY MTR SLVR 16FR STAT (SET/KITS/TRAYS/PACK) ×3 IMPLANT

## 2018-08-03 NOTE — Progress Notes (Signed)
POD # 5 Feeling well, ambulated + BM and gas,  Taking clears NO N/V AVSS Good UO She is hungry  Creat ok Path d/w pt  PE NAD Abd: soft, incisions healing well,  minimal incisional tenderness. No peritonitis or infection  A/P Improving Advance full liquids No surgical intervention F/U clinically may repeat CT depending on clinical picture

## 2018-08-03 NOTE — Anesthesia Procedure Notes (Signed)
Procedure Name: Intubation Date/Time: 08/03/2018 10:16 PM Performed by: Jonna Clark, CRNA Pre-anesthesia Checklist: Patient identified, Patient being monitored, Timeout performed, Emergency Drugs available and Suction available Patient Re-evaluated:Patient Re-evaluated prior to induction Oxygen Delivery Method: Circle system utilized Preoxygenation: Pre-oxygenation with 100% oxygen Induction Type: IV induction Ventilation: Mask ventilation without difficulty Laryngoscope Size: Mac and 3 Grade View: Grade I Tube type: Oral Tube size: 7.0 mm Number of attempts: 1 Placement Confirmation: ETT inserted through vocal cords under direct vision,  positive ETCO2 and breath sounds checked- equal and bilateral Secured at: 21 cm Tube secured with: Tape Dental Injury: Teeth and Oropharynx as per pre-operative assessment

## 2018-08-03 NOTE — Progress Notes (Signed)
Patient complaining of a film on tongue. Noted some white coating. Administered PRN dose of diflucan. Patient stated she recently was treated for a vaginal yeast infection two weeks prior to surgery. Will continue to monitor.   Fuller Mandril, RN

## 2018-08-03 NOTE — Progress Notes (Signed)
Paged on call attending MD. Patient having multiple loose stools within an hour.   Fuller Mandril, RN

## 2018-08-03 NOTE — Anesthesia Preprocedure Evaluation (Signed)
Anesthesia Evaluation  Patient identified by MRN, date of birth, ID band Patient awake    Reviewed: Allergy & Precautions, H&P , NPO status , Patient's Chart, lab work & pertinent test results, reviewed documented beta blocker date and time   Airway Mallampati: II   Neck ROM: full    Dental  (+) Teeth Intact   Pulmonary neg pulmonary ROS,    Pulmonary exam normal        Cardiovascular Exercise Tolerance: Good hypertension, On Medications Normal cardiovascular exam+ dysrhythmias  Rhythm:regular Rate:Normal     Neuro/Psych  Headaches,  Neuromuscular disease negative psych ROS   GI/Hepatic Neg liver ROS, GERD  Medicated,  Endo/Other  neg diabetes  Renal/GU negative Renal ROS  negative genitourinary   Musculoskeletal   Abdominal   Peds  Hematology negative hematology ROS (+)   Anesthesia Other Findings   Reproductive/Obstetrics negative OB ROS                             Anesthesia Physical  Anesthesia Plan  ASA: II and emergent  Anesthesia Plan: General ETT   Post-op Pain Management:    Induction:   PONV Risk Score and Plan: 4 or greater and Dexamethasone, Ondansetron, Midazolam and Treatment may vary due to age or medical condition  Airway Management Planned: Oral ETT  Additional Equipment:   Intra-op Plan:   Post-operative Plan:   Informed Consent: I have reviewed the patients History and Physical, chart, labs and discussed the procedure including the risks, benefits and alternatives for the proposed anesthesia with the patient or authorized representative who has indicated his/her understanding and acceptance.   Dental Advisory Given  Plan Discussed with: CRNA  Anesthesia Plan Comments:         Anesthesia Quick Evaluation

## 2018-08-03 NOTE — Progress Notes (Signed)
Pt seen and examined. Multiple Liquid BM > 15. Now tachycardia. Her abdominal exam surprisingly is not peritonitis. KUB shows free air more than expected postoperatively. I had a lengthy discussion wih the pt I am concerned about persistent Leak, Now she is tachycardic. I do believe that the safest thing to do is to re-explore her and do a diverting loop and abdominal washout. ANother less ideal option is to control diarrhea aggressively and do A CT scan an potentially place a drain. I do think that given the fact that she deteriorated today it is in her best interest to proceed with re-exploration. D/W the pt and the family in detail. Risks, benefits and possible complications. They understand

## 2018-08-03 NOTE — Anesthesia Post-op Follow-up Note (Signed)
Anesthesia QCDR form completed.        

## 2018-08-03 NOTE — Op Note (Signed)
PROCEDURES: Laparotomy w Diverting loop ileostomy and # 19 FR drain palcmement  Pre-operative Diagnosis: Anastomotic leak  Post-operative Diagnosis: contained leak, no evidence of gross contamination within the intra-abdominal cavity  Surgeon: Marjory Lies Rutledge Selsor    Anesthesia: General endotracheal anesthesia  ASA Class: 2  Surgeon: Caroleen Hamman , MD FACS  Anesthesia: Gen. with endotracheal tube  Findings: Contained Anastomotic leak. Upon opening of the collection feculent fluid aspirated   Estimated Blood Loss: 50cc         Drains: 19 FR          Specimens: none          Complications: none               Condition: stable  Procedure Details  The patient was seen again in the Holding Room. The benefits, complications, treatment options, and expected outcomes were discussed with the patient. The risks of bleeding, infection, recurrence of symptoms, failure to resolve symptoms,  bowel injury, any of which could require further surgery were reviewed with the patient.   The patient was taken to Operating Room, identified as Whitney Cooper and the procedure verified.  A Time Out was held and the above information confirmed.  Prior to the induction of general anesthesia, antibiotic prophylaxis was administered. VTE prophylaxis was in place. General endotracheal anesthesia was then administered and tolerated well. After the induction, the abdomen was prepped with Chloraprep and draped in the sterile fashion. The patient was positioned in the supine position.   I reopened the midline incision and extended inferiorly. PDS was cut and the abd cavity was entered. There was no purulence. Further inspection of the pelvis revealed a collection that was entered revealing feculent  Fluid. Inspection of the anastomosis failed to revealed any obvious defects. It was viable. There was significant inflammatory response. The bowel was run and we did not identified any injuries.   A 19 Blake drain was  placed in the pelvis and secured w 3-0 nylon.  A defect was created on the RLQ and a loop of terminal ileum under no tension was brought via the defect.   We close the abdomen with a 0 PDS suture in a running fashion and the skin was closed with staples w 1/2 inch packing wigs.   THe ileostomy was matured in the standard  Brook fashion w interrupted 3-0 vicryls. A 16 FR rubber was placed within the mesentery of the loop ileostomy to elevate the bowel. Ostomy bag was placed w/o leak. Needle and laparotomy count were correct and there were no immediate complications. Caroleen Hamman, MD, FACS

## 2018-08-03 NOTE — Progress Notes (Signed)
Per Dr. Dahlia Byes start continuous IV fluids and keep patient NPO. Recheck VS in hour and call back with status.   Fuller Mandril, RN

## 2018-08-03 NOTE — Transfer of Care (Signed)
Immediate Anesthesia Transfer of Care Note  Patient: Whitney Cooper  Procedure(s) Performed: EXPLORATORY LAPAROTOMY (N/A ) ILEOSTOMY (N/A )  Patient Location: PACU  Anesthesia Type:General  Level of Consciousness: drowsy and patient cooperative  Airway & Oxygen Therapy: Patient Spontanous Breathing and Patient connected to face mask oxygen  Post-op Assessment: Report given to RN and Post -op Vital signs reviewed and stable  Post vital signs: Reviewed and stable  Last Vitals:  Vitals Value Taken Time  BP 152/76 08/03/2018 11:51 PM  Temp 36.9 C 08/03/2018 11:50 PM  Pulse    Resp 14 08/03/2018 11:54 PM  SpO2 98 % 08/03/2018 11:50 PM  Vitals shown include unvalidated device data.  Last Pain:  Vitals:   08/03/18 2012  TempSrc: Oral  PainSc:       Patients Stated Pain Goal: 0 (87/19/59 [id number])  Complications: No apparent anesthesia complications

## 2018-08-04 ENCOUNTER — Encounter: Payer: Self-pay | Admitting: Surgery

## 2018-08-04 LAB — BASIC METABOLIC PANEL
Anion gap: 7 (ref 5–15)
BUN: <5 mg/dL — ABNORMAL LOW (ref 6–20)
CO2: 26 mmol/L (ref 22–32)
Calcium: 8.3 mg/dL — ABNORMAL LOW (ref 8.9–10.3)
Chloride: 108 mmol/L (ref 98–111)
Creatinine, Ser: 0.55 mg/dL (ref 0.44–1.00)
GFR calc Af Amer: >60 mL/min (ref >60)
GFR calc non Af Amer: >60 mL/min (ref >60)
Glucose, Bld: 148 mg/dL — ABNORMAL HIGH (ref 70–99)
Potassium: 4.1 mmol/L (ref 3.5–5.1)
Sodium: 141 mmol/L (ref 135–145)

## 2018-08-04 LAB — CBC
HEMATOCRIT: 35.5 % — AB (ref 36.0–46.0)
Hemoglobin: 11.7 g/dL — ABNORMAL LOW (ref 12.0–15.0)
MCH: 28.8 pg (ref 26.0–34.0)
MCHC: 33 g/dL (ref 30.0–36.0)
MCV: 87.4 fL (ref 80.0–100.0)
Platelets: 353 10*3/uL (ref 150–400)
RBC: 4.06 MIL/uL (ref 3.87–5.11)
RDW: 13 % (ref 11.5–15.5)
WBC: 13.5 10*3/uL — ABNORMAL HIGH (ref 4.0–10.5)
nRBC: 0 % (ref 0.0–0.2)

## 2018-08-04 MED ORDER — FENTANYL CITRATE (PF) 100 MCG/2ML IJ SOLN
INTRAMUSCULAR | Status: AC
  Administered 2018-08-04: 25 ug via INTRAVENOUS
  Filled 2018-08-04: qty 2

## 2018-08-04 MED ORDER — KETOROLAC TROMETHAMINE 30 MG/ML IJ SOLN
30.0000 mg | Freq: Four times a day (QID) | INTRAMUSCULAR | Status: DC
  Administered 2018-08-04 – 2018-08-07 (×9): 30 mg via INTRAVENOUS
  Filled 2018-08-04 (×9): qty 1

## 2018-08-04 MED ORDER — SODIUM CHLORIDE 0.9 % IV SOLN
INTRAVENOUS | Status: DC | PRN
  Administered 2018-08-04 (×3): 250 mL via INTRAVENOUS

## 2018-08-04 NOTE — Progress Notes (Signed)
[place] Hospital Day(s): 6.   Post op day(s): 1 Day Post-Op.   Interval History: Patient seen and examined, no acute events or new complaints overnight. Patient reports that she is having abdominal pain near her incision but no complaints of nausea or emesis. No stool or gas in ileostomy. Tolerated clear liquids. Plans to mobilize today.   Review of Systems:  Constitutional: denies fever, chills  Gastrointestinal: + abdominal pain, denied N/V, or diarrhea/and bowel function as per interval history Integumentary: denies any other rashes or skin discolorations except surgical incisions  Vital signs in last 24 hours: [min-max] current  Temp:  [98.3 F (36.8 C)-98.9 F (37.2 C)] 98.3 F (36.8 C) (01/13 0453) Pulse Rate:  [89-114] 91 (01/13 0453) Resp:  [11-20] 20 (01/13 0453) BP: (98-161)/(70-81) 143/70 (01/13 0453) SpO2:  [92 %-100 %] 97 % (01/13 0453)     Height: 5\' 4"  (162.6 cm) Weight: 64 kg BMI (Calculated): 24.19   Intake/Output this shift:  No intake/output data recorded.   Intake/Output last 2 shifts:  @IOLAST2SHIFTS @   Physical Exam:  Constitutional: alert, cooperative and no distress  Respiratory: breathing non-labored at rest   Gastrointestinal: soft, incisional tenderness, and non-distended. Ileostomy in RLQ, red-rubbed catheter in ileostomy, no stool or gas in bag. JP in LLQ (serosanguinous) Integumentary: Midline laparotomy incision is CDI, no erythema. Minimal drainage inferior aspect of honey comb dressing  Labs:  CBC Latest Ref Rng & Units 08/04/2018 08/02/2018 08/01/2018  WBC 4.0 - 10.5 K/uL 13.5(H) 9.4 15.7(H)  Hemoglobin 12.0 - 15.0 g/dL 11.7(L) 9.5(L) 10.6(L)  Hematocrit 36.0 - 46.0 % 35.5(L) 29.8(L) 31.3(L)  Platelets 150 - 400 K/uL 353 195 171   CMP Latest Ref Rng & Units 08/04/2018 08/03/2018 08/02/2018  Glucose 70 - 99 mg/dL 148(H) 90 117(H)  BUN 6 - 20 mg/dL <5(L) <5(L) 6  Creatinine 0.44 - 1.00 mg/dL 0.55 0.56  0.58  Sodium 135 - 145 mmol/L 141 142 142  Potassium 3.5 - 5.1 mmol/L 4.1 3.8 3.1(L)  Chloride 98 - 111 mmol/L 108 112(H) 113(H)  CO2 22 - 32 mmol/L 26 26 25   Calcium 8.9 - 10.3 mg/dL 8.3(L) 8.2(L) 7.9(L)  Total Protein 6.5 - 8.1 g/dL - - -  Total Bilirubin 0.3 - 1.2 mg/dL - - -  Alkaline Phos 38 - 126 U/L - - -  AST 15 - 41 U/L - - -  ALT 0 - 44 U/L - - -     Assessment/Plan: 58 y.o. female with a mild leukocytosis most likely attributable to recent surgery who is 1 Day Post-Op s/p exploratory laparotomy and diverting ileostomy for contained anastomotic leak following left colectomy on 01/07 for recurrent diverticulitis, complicated by pertinent comorbidities including HTN, HLD, GERD, and OA..   - Advance to full liquids (ADAT) + IVF  - Continue to monitor abdominal examination and on-going ostomy function  - Pain control as needed (minimize narcotics), will restart Toradol, Anti-emetics PRN  - Monitor leukocytosis (CBC)  - Discontinue foley catheter    - Encourage mobilization  - DVT prophylaxis   All of the above findings and recommendations were discussed with the patient, and the medical team, and all of patient's questions were answered to her expressed satisfaction.  -- Edison Simon, PA-C [facility] 08/04/2018, 8:04 AM [phone] M-F: 7am - 4pm

## 2018-08-04 NOTE — Progress Notes (Signed)
Foley removed per MD order.  

## 2018-08-04 NOTE — Anesthesia Postprocedure Evaluation (Signed)
Anesthesia Post Note  Patient: Whitney Cooper  Procedure(s) Performed: EXPLORATORY LAPAROTOMY (N/A ) ILEOSTOMY (N/A )  Patient location during evaluation: PACU Anesthesia Type: General Level of consciousness: awake and alert Pain management: pain level controlled Vital Signs Assessment: post-procedure vital signs reviewed and stable Respiratory status: spontaneous breathing and respiratory function stable Cardiovascular status: stable Anesthetic complications: no     Last Vitals:  Vitals:   08/04/18 0103 08/04/18 0125  BP: (!) 155/77 (!) 150/81  Pulse: 95 100  Resp: 12 12  Temp: 37 C 37.2 C  SpO2: 99% 99%    Last Pain:  Vitals:   08/04/18 0125  TempSrc: Oral  PainSc:                  Jomel Whittlesey K

## 2018-08-04 NOTE — Progress Notes (Signed)
Physical Therapy Treatment Patient Details Name: Whitney Cooper MRN: [id number] DOB: 1960-10-08 Today's Date: 08/04/2018    History of Present Illness Pt is a 58 y/o F s/p laparoscopic sigmoid colectomy for recurrent diverticulitis.  Pt now s/p exploratory laparatomy with diverting loop ileostomy with drain placement.  Pt's PMH includes vertigo, osteopenia, fibromyalgia.     PT Comments    Whitney Cooper made good progress toward mobility goals, ambulating around the nurses station without AD with only one episode of unsteadiness with higher level balance activity of marching while ambulating.  Pt participated in higher level balance interventions which she tolerated well.  Encouraged pt to continue ambulating in hallway with nursing staff at least 3x/day.  Continue to anticipate no PT needs at d/c as pt quickly progressing to independence with all activities. Will continue to follow acutely.   Follow Up Recommendations  No PT follow up     Equipment Recommendations  None recommended by PT    Recommendations for Other Services       Precautions / Restrictions Precautions Precautions: Fall;Other (comment) Precaution Comments: abdominal incision; ostomy bag; JP drain Restrictions Weight Bearing Restrictions: No    Mobility  Bed Mobility Overal bed mobility: Needs Assistance Bed Mobility: Rolling;Sidelying to Sit Rolling: Supervision Sidelying to sit: Supervision       General bed mobility comments: Pt performed log roll technique correctly with HOB slightly elevated and use of bed rail.   Transfers Overall transfer level: Needs assistance Equipment used: None Transfers: Sit to/from Stand Sit to Stand: Min guard         General transfer comment: Pt slow to stand with guarded posture but no unsteadiness noted.    Ambulation/Gait Ambulation/Gait assistance: Min guard Gait Distance (Feet): 160 Feet Assistive device: None;IV Pole Gait Pattern/deviations: Step-through  pattern;Decreased step length - right;Decreased step length - left Gait velocity: decreased   General Gait Details: Guarded posture as pt was lightheaded earlier this morning when ambulating but denies this now.  Pt initially holding onto IV pole for first 10 ft but then instructed to ambulate without UE support.  Pt with guarded posture and one episode of unsteadiness with marching while ambulating, but otherwise steady.    Stairs             Wheelchair Mobility    Modified Rankin (Stroke Patients Only)       Balance Overall balance assessment: Needs assistance Sitting-balance support: No upper extremity supported;Feet supported Sitting balance-Leahy Scale: Good     Standing balance support: No upper extremity supported;During functional activity Standing balance-Leahy Scale: Fair Standing balance comment: Pt becomes slightly unsteady with balance interventions             High level balance activites: Turns;Sudden stops;Head turns;Other (comment)(marching while ambulating) High Level Balance Comments: One episode of unsteadiness with marching but able to steady             Cognition Arousal/Alertness: Awake/alert Behavior During Therapy: WFL for tasks assessed/performed Overall Cognitive Status: Within Functional Limits for tasks assessed                                        Exercises Other Exercises Other Exercises: Encouraged pt to ambulate with nursing staff at least 3x/day without holding onto IV pole with min guard assist for safety.  Other Exercises: Rhomberg eyes closed x30 sec with intermittent min assist Other Exercises: Marching  in standing x10 each LE with mild unsteadiness but no LOB Other Exercises: Tandem stance and reaching ipsilaterally and contralaterally for cup x1 minute each LE    General Comments        Pertinent Vitals/Pain Pain Assessment: Faces Faces Pain Scale: Hurts little more Pain Location: surgical  site Pain Descriptors / Indicators: Discomfort Pain Intervention(s): Limited activity within patient's tolerance;Monitored during session;Repositioned;Utilized relaxation techniques    Home Living                      Prior Function            PT Goals (current goals can now be found in the care plan section) Acute Rehab PT Goals Patient Stated Goal: to return to PLOF PT Goal Formulation: With patient Time For Goal Achievement: 08/13/18 Potential to Achieve Goals: Good Progress towards PT goals: Progressing toward goals    Frequency    Min 2X/week      PT Plan Current plan remains appropriate    Co-evaluation              AM-PAC PT "6 Clicks" Mobility   Outcome Measure  Help needed turning from your back to your side while in a flat bed without using bedrails?: A Little Help needed moving from lying on your back to sitting on the side of a flat bed without using bedrails?: A Little Help needed moving to and from a bed to a chair (including a wheelchair)?: A Little Help needed standing up from a chair using your arms (e.g., wheelchair or bedside chair)?: A Little Help needed to walk in hospital room?: A Little Help needed climbing 3-5 steps with a railing? : A Little 6 Click Score: 18    End of Session Equipment Utilized During Treatment: Gait belt Activity Tolerance: Patient tolerated treatment well Patient left: in chair;with call bell/phone within reach;with chair alarm set;Other (comment)(with RN in room) Nurse Communication: Mobility status PT Visit Diagnosis: Unsteadiness on feet (R26.81)     Time: 0962-8366 PT Time Calculation (min) (ACUTE ONLY): 28 min  Charges:  $Gait Training: 8-22 mins $Neuromuscular Re-education: 8-22 mins                    Whitney Cooper PT, DPT 08/04/2018, 4:46 PM

## 2018-08-04 NOTE — Consult Note (Signed)
[place] Nurse ostomy consult note Stoma type/location: RLQ ileostomy Stomal assessment/size: Viewed through intact pouch, stoma is <14 hours old.  Plan for pouch change at 11am tomorrow Peristomal assessment: Not seen today Treatment options for stomal/peristomal skin: None indicated Output: thin, dark green effluent  Ostomy pouching: 2pc. Pouch. Will place 1-piece convex in am tomorrow Education provided:  Explained role of ostomy nurse and creation of stoma  Explained stoma characteristics (budded, flush, color, texture, care) Education on emptying when 1/3 to 1/2 full and how to empty Demonstrated use of wick to clean spout  Discussed bathing, diet, gas, medication use, constipation, diarrhea, dehydration  Discussed food blockage   Discussed risk of peristomal hernia  Answered patient/family questions:  Yes.  Patient was unaware she had an ostomy until just 2 hours previous to visit.  Had just been on line to begin reading and expressed gratitude for [facility] Nurse visit.  Mother in just as visit began.  She will plan to attend first pouch change visit tomorrow. Educational booklet left in room.  Enrolled patient in [place] program: No, but signature obtained. Will submit following pouch change tomorrow.   [place] nursing team will follow, and will remain available to this patient, the nursing and medical teams.  Thanks, Maudie Flakes, MSN, RN, [place], Arther Abbott  Pager# [phone]

## 2018-08-05 LAB — CBC
HCT: 30 % — ABNORMAL LOW (ref 36.0–46.0)
Hemoglobin: 9.7 g/dL — ABNORMAL LOW (ref 12.0–15.0)
MCH: 29 pg (ref 26.0–34.0)
MCHC: 32.3 g/dL (ref 30.0–36.0)
MCV: 89.6 fL (ref 80.0–100.0)
PLATELETS: 325 10*3/uL (ref 150–400)
RBC: 3.35 MIL/uL — ABNORMAL LOW (ref 3.87–5.11)
RDW: 13.1 % (ref 11.5–15.5)
WBC: 10.2 10*3/uL (ref 4.0–10.5)
nRBC: 0 % (ref 0.0–0.2)

## 2018-08-05 LAB — CREATININE, SERUM
CREATININE: 0.63 mg/dL (ref 0.44–1.00)
GFR calc Af Amer: >60 mL/min (ref >60)

## 2018-08-05 MED ORDER — SODIUM CHLORIDE 0.9% FLUSH: 10.0000 mL | INTRAVENOUS | Status: DC | PRN

## 2018-08-05 NOTE — Progress Notes (Addendum)
[place] Hospital Day(s): 7.   Post op day(s): 2 Days Post-Op.   Interval History: Patient seen and examined, no acute events or new complaints overnight. Patient reports that her abdominal soreness has improved some and she has noticed gas and stool from her ileostomy. No complaints of fever, chills, CP, SOB, nausea, or emesis. Tolerating a soft diet. Mobilizing on the unit well.   Review of Systems:  Constitutional: denies fever, chills  Respiratory: denies any shortness of breath  Cardiovascular: denies chest pain or palpitations  Gastrointestinal: + abdominal soreness, denied N/V, or diarrhea/and bowel function as per interval history Integumentary: denies any other rashes or skin discolorations except surgical incisions  Vital signs in last 24 hours: [min-max] current  Temp:  [98.1 F (36.7 C)-98.5 F (36.9 C)] 98.1 F (36.7 C) (01/14 0614) Pulse Rate:  [74-89] 74 (01/14 0614) Resp:  [17-18] 17 (01/14 0614) BP: (114-136)/(58-68) 114/58 (01/14 0614) SpO2:  [96 %-98 %] 98 % (01/14 0614)     Height: 5\' 4"  (162.6 cm) Weight: 64 kg BMI (Calculated): 24.19   Intake/Output this shift:  Total I/O In: -  Out: 220 [Urine:200; Drains:20]   Intake/Output last 2 shifts:  @IOLAST2SHIFTS @   Physical Exam:  Constitutional: alert, cooperative and no distress  Respiratory: breathing non-labored at rest   Gastrointestinal: soft, incisional tenderness, and non-distended. Ileostomy in RLQ, red-rubbed catheter in ileostomy, there is gas and stool present in bag this morning. JP in LLQ (serosanguinous) Integumentary: Midline laparotomy incision is CDI, no erythema. Minimal drainage inferior aspect of honey comb dressing   Labs:  CBC Latest Ref Rng & Units 08/05/2018 08/04/2018 08/02/2018  WBC 4.0 - 10.5 K/uL 10.2 13.5(H) 9.4  Hemoglobin 12.0 - 15.0 g/dL 9.7(L) 11.7(L) 9.5(L)  Hematocrit 36.0 - 46.0 % 30.0(L) 35.5(L) 29.8(L)  Platelets 150 - 400  K/uL 325 353 195   CMP Latest Ref Rng & Units 08/05/2018 08/04/2018 08/03/2018  Glucose 70 - 99 mg/dL - 148(H) 90  BUN 6 - 20 mg/dL - <5(L) <5(L)  Creatinine 0.44 - 1.00 mg/dL 0.63 0.55 0.56  Sodium 135 - 145 mmol/L - 141 142  Potassium 3.5 - 5.1 mmol/L - 4.1 3.8  Chloride 98 - 111 mmol/L - 108 112(H)  CO2 22 - 32 mmol/L - 26 26  Calcium 8.9 - 10.3 mg/dL - 8.3(L) 8.2(L)  Total Protein 6.5 - 8.1 g/dL - - -  Total Bilirubin 0.3 - 1.2 mg/dL - - -  Alkaline Phos 38 - 126 U/L - - -  AST 15 - 41 U/L - - -  ALT 0 - 44 U/L - - -     Assessment/Plan:  58 y.o. female with return of ostomy function who is overall doing well 2 Days Post-Op exploratory laparotomy and diverting ileostomy for contained anastomotic leak following left colectomy on 01/07 for recurrent diverticulitis, complicated by pertinent comorbidities including HTN, HLD, GERD, and OA..   - Continue soft diet + IVF (will discontinue as ostomy function returns)   - Continue to monitor abdominal examination and on-going ostomy function  - Appreciate WOC RN help with ostomy management and patient teaching.              - Pain control as needed (minimize narcotics), Anti-emetics PRN             - Discontinue K+ replacement             - Encourage mobilization             -  DVT prophylaxis   All of the above findings and recommendations were discussed with the patient, and the medical team, and all of patient's questions were answered to her expressed satisfaction.  -- Edison Simon, PA-C [facility] 08/05/2018, 9:32 AM [phone] M-F: 7am - 4pm  I saw and evaluated the patient.  I agree with the above documentation, exam, and plan, which I have edited where appropriate. Fredirick Maudlin  9:42 AM

## 2018-08-05 NOTE — Consult Note (Signed)
[place] Nurse ostomy follow up Stoma type/location: RLQ ileostomy Stomal assessment/size: 1 and 1/4 inches, minimally budded loop ileostomy with very small red rubber catheter bridge from 12-6 o'clock sutured to skin. Proximal (functioning) limb at 3 o'clock, os at skin level. Peristomal assessment: intact, mild erythema circumferentially to 0.5cm Treatment options for stomal/peristomal skin: skin barrier ring Output: thin yellow effluent Ostomy pouching: 1pc.convex with skin barrier ring  Education provided:  Demonstrated pouch change (Pouch removal, measuring stoma, cutting new skin barrier, cleansing peristomal skin and stoma, use of skin barrier ring) Discussed ostomy belt and possibility that one may be needed for extra security and to protect skin at 3 o'clock, particularly. Will send a sample of one to patient via [employer].  Education (simulated demonstration) on emptying when 1/3 to 1/2 full and how to empty Demonstrated use of wick to clean spout  Discussed risk of peristomal hernia Answered patient/family questions:  Mom present for session. Patient is normally squeamish and was worried that she might be nauseated during session.  Required only one "break", but composed herself and did well. Required rest after session due to anticipation and stress of change.  Enrolled patient in [place] [facility] Discharge program: Yes, today  [place] nursing team will follow, and will remain available to this patient, the nursing, surgical and medical teams.  Thanks, Maudie Flakes, MSN, RN, [place], Arther Abbott  Pager# [phone]

## 2018-08-06 NOTE — Plan of Care (Signed)
Nutrition Education Note  RD consulted for nutrition education regarding new ileostomy.   RD provided "Ileostomy Nutrition Therapy" handout from the Academy of [employer]. Reviewed patient's dietary recall. Advised patient on ways to reduce gas output such as eating meals and snacks at the same times everyday, avoiding acidic, fried, sugary, fatty, and greasy foods. Advised patient to avoid straws, carbonated beverages, chewing gum or tobacco, eating too fast, and skipping meals. Provided patient with a list of gas and odor producing foods and advised patient to reintroduce fiber slowly. Recommended patient drink plenty of water and made patient aware of the increased risk of dehydration. Advised patient to chew foods well and to use chewable multivitamins for better absorption.  Pt states that she has not been eating many high-fiber foods for a while PTA and has been eating a lot of softer foods. Pt with questions regarding yogurt, etc. Discussed foods that may help to increase bulk of stool. Also discussed foods that may change the color of stool or make stool more watery.  Teach back method used.  Expect good compliance.  Body mass index is 24.2 kg/m. Pt meets criteria for normal weight based on current BMI.  Current diet order is regular, patient is consuming approximately 75% of meals at this time. Labs and medications reviewed. No further nutrition interventions warranted at this time. RD contact information provided. If additional nutrition issues arise, please re-consult RD.   Gaynell Face, MS, RD, LDN Inpatient Clinical Dietitian Pager: [phone] Weekend/After Hours: [phone]

## 2018-08-06 NOTE — Consult Note (Signed)
[place] Nurse ostomy follow up Stoma type/location: RLQ loop ileostomy Stomal assessment/size: Not assessed today, visualized through intact pouching system. I will assess and measure tomorrow during pouch change Peristomal assessment: Not seen today Treatment options for stomal/peristomal skin: skin barrier ring in place Output: thin brown effluent Ostomy pouching: 1pc convex pouch with skin barrier ring  Education provided: Patient to practice emptying today with staff and goal is to become independent by tomorrow. Using toilet paper "wicks" for cleaning of the tail closure of pouch. Belt provided, also 1-page teaching sheet for pouch change procedure which we will go over tomorrow when we change pouch.  HHRN is requested for assistance in resizing pouch aperture as edema subsides, and for continued support and teaching. If you agree, please order/arrange with Case Management. Also, guidance is sought from Dr. Dahlia Byes regarding removal of ostomy support bridge to determine if he would like me to remove with pouch change or if he would rather do in the office. Please advise. Enrolled patient in [place] [employer] Discharge program: Yes  [place] nursing team will follow, and will remain available to this patient, the nursing, surgical and medical teams.   Thanks, Maudie Flakes, MSN, RN, [place], Arther Abbott  Pager# [phone]

## 2018-08-06 NOTE — Progress Notes (Signed)
[place] Hospital Day(s): 8.   Post op day(s): 3 Days Post-Op.   Interval History: Patient seen and examined, no acute events or new complaints overnight. Patient reports that she had to get up a lot throughout the night to urinate and did not sleep well. She still endorses abdominal soreness near her incisions worse with movement. Mild nausea this morning but no fever, chills, or emesis. She continues to notice stool and gas in her ostomy but has not changed it independently. Mobilizing on the unit well.   Review of Systems:  Constitutional: denies fever, chills  Gastrointestinal: + abdominal soreness, + Nausea, denied Vomiting, or diarrhea/and bowel function as per interval history Integumentary: denies any other rashes or skin discolorations except surgical incisions   Vital signs in last 24 hours: [min-max] current  Temp:  [98.2 F (36.8 C)-98.7 F (37.1 C)] 98.2 F (36.8 C) (01/15 0543) Pulse Rate:  [77-98] 98 (01/15 0543) Resp:  [15-18] 16 (01/15 0543) BP: (123-131)/(56-72) 131/72 (01/15 0543) SpO2:  [98 %-100 %] 98 % (01/15 0543)     Height: 5\' 4"  (162.6 cm) Weight: 64 kg BMI (Calculated): 24.19   Intake/Output this shift:  No intake/output data recorded.   Intake/Output last 2 shifts:  @IOLAST2SHIFTS @   Physical Exam:  Constitutional: alert, cooperative and no distress  Respiratory: breathing non-labored at rest  Gastrointestinal: soft,incisional tenderness, and non-distended. Ileostomy in RLQ, red-rubbed catheter in ileostomy, there is gas and stool present in bag this morning. JP in LLQ (serous) Integumentary:Midline laparotomy incision is CDI, no erythema. Minimal drainage inferior aspect of honey comb dressing  Labs:  CBC Latest Ref Rng & Units 08/05/2018 08/04/2018 08/02/2018  WBC 4.0 - 10.5 K/uL 10.2 13.5(H) 9.4  Hemoglobin 12.0 - 15.0 g/dL 9.7(L) 11.7(L) 9.5(L)  Hematocrit 36.0 - 46.0 % 30.0(L) 35.5(L) 29.8(L)   Platelets 150 - 400 K/uL 325 353 195   CMP Latest Ref Rng & Units 08/05/2018 08/04/2018 08/03/2018  Glucose 70 - 99 mg/dL - 148(H) 90  BUN 6 - 20 mg/dL - <5(L) <5(L)  Creatinine 0.44 - 1.00 mg/dL 0.63 0.55 0.56  Sodium 135 - 145 mmol/L - 141 142  Potassium 3.5 - 5.1 mmol/L - 4.1 3.8  Chloride 98 - 111 mmol/L - 108 112(H)  CO2 22 - 32 mmol/L - 26 26  Calcium 8.9 - 10.3 mg/dL - 8.3(L) 8.2(L)  Total Protein 6.5 - 8.1 g/dL - - -  Total Bilirubin 0.3 - 1.2 mg/dL - - -  Alkaline Phos 38 - 126 U/L - - -  AST 15 - 41 U/L - - -  ALT 0 - 44 U/L - - -     Assessment/Plan:  58 y.o. female who is overall doing well 3 Days Post-Op s/p exploratory laparotomy and diverting ileostomyfor contained anastomotic leak following left colectomy on 01/07 for recurrent diverticulitis, complicated by pertinent comorbidities includingHTN, HLD, GERD, and OA..   - Regular Diet + Discontinue IVF  - Continue to monitor abdominal examination and on-going ostomy function             - Appreciate WOC RN help with ostomy management and patient teaching.  - Pain control as needed (minimize narcotics), Anti-emetics PRN - Encourage mobilization - DVT prophylaxis  All of the above findings and recommendations were discussed with the patient, and the medical team, and all of patient's questions were answered to her expressed satisfaction.  -- Edison Simon, PA-C [facility] 08/06/2018, 8:31 AM [phone] M-F:  7am - 4pm

## 2018-08-06 NOTE — Care Management Note (Signed)
Case Management Note  Patient Details  Name: Whitney Cooper MRN: [id number] Date of Birth: 12-19-1960  Subjective/Objective:     RNCM to bedside this morning for assessment.  From home with husband.  Patient states yesterday she assisted with ostomy care.  She will assist again tomorrow and change pouch with WOC.  She would like her mother to be present to assist as she will be helping her once discharged.  Offered home health RN for assistance at home.  At this time patient declines.  She is hopeful that she and her mother can manage independently once discharged.  Informed patient if she changes her mind RNCM will arrange.  She declines difficulties obtaining medications or with medical care.  She is independent and works for [employer] in [place].  Her pharmacy is [facility].  She is current with her PCP.  She has been enrolled in the "[employer] Start" program per [employer].  Will assist as needed for discharge planning.             Action/Plan:   Expected Discharge Date:                  Expected Discharge Plan:  Home/Self Care  In-House Referral:     Discharge planning Services  CM Consult  Post Acute Care Choice:    Choice offered to:     DME Arranged:  Ostomy supplies DME Agency:  [place]:    [facility] Agency:     Status of Service:  In process, will continue to follow  If discussed at Long Length of Stay Meetings, dates discussed:    Additional Comments:  Elza Rafter, RN 08/06/2018, 12:18 PM

## 2018-08-06 NOTE — Progress Notes (Signed)
Patient requested not to receive 1200 Toradol and Tylenol.

## 2018-08-07 MED ORDER — METRONIDAZOLE 500 MG PO TABS: 500.0000 mg | ORAL_TABLET | Freq: Three times a day (TID) | ORAL | 0 refills | Status: DC

## 2018-08-07 MED ORDER — CIPROFLOXACIN HCL 500 MG PO TABS: 500.0000 mg | ORAL_TABLET | Freq: Two times a day (BID) | ORAL | 0 refills | Status: DC

## 2018-08-07 MED ORDER — OXYCODONE HCL 5 MG PO TABS: 5.0000 mg | ORAL_TABLET | Freq: Four times a day (QID) | ORAL | 0 refills | Status: DC | PRN

## 2018-08-07 NOTE — Discharge Summary (Signed)
[facility] SURGICAL DISCHARGE SUMMARY  Patient ID: Whitney Cooper MRN: [id number] DOB/AGE: 1961-06-18 58 y.o.  Admit date: 07/29/2018 Discharge date: 08/07/2018  Discharge Diagnoses Diverticulitis  Consultants None  Procedures  -- 07/29/2018:  1. Laparoscopic lysis of adhesions taking at least 60 minutes of total operative time 2. Laparoscopic low anterior resection  3. Laparoscopic takedown of splenic flexure  -- 08/03/2018:  Laparotomy w Diverting loop ileostomy and # 56 FR drain placement   HPI: Whitney Cooper is a 58 y.o. female with a history of recurrent diverticulitis who presented to [facility] on 07/29/2018 for laparoscopic left colectomy with Dr Dahlia Byes.   Hospital Course: Informed consent was obtained and documented, and patient underwent uneventful laparoscopic left colectomy (Dr Dahlia Byes, 07/29/2018).  Post-operatively, the patient developed a fever and tachycardia on POD2 and there was concern for anastomotic leak. She underwent gastrografin enema study which was concerning for possible small contained leak. Conservative management was attempted however on POD5 she again became febrile and tachycardic and the decision to return to the OR was made. Informed consent was obtained and documented, and patient underwent uneventful laparotomy with diverting loop ileostomy (Dr Dahlia Byes, 08/03/2018).  Post-operatively, patient's  Pain and nausea improved/resolved and advancement of patient's diet and ambulation were well-tolerated. The remainder of patient's hospital course was essentially unremarkable, and discharge planning was initiated accordingly with patient safely able to be discharged home with appropriate discharge instructions, antibiotics, pain control, and outpatient follow-up after all of her questions were answered to her expressed satisfaction.  Discharge Condition: Good   Physical Examination:  Constitutional: alert, cooperative and no distress  Respiratory:  breathing non-labored at rest  Gastrointestinal: soft,non-tender, and non-distended. Ileostomy in RLQ, red-rubbed catheter in ileostomy,there is gas and stool present in bag this morning. JP in LLQ (serous) Integumentary:Midline laparotomy incision is CDI, no erythema. Minimal drainage inferior aspect of honey comb dressing   Allergies as of 08/07/2018      Reactions   Baclofen Other (See Comments)   Dizziness   Codeine Other (See Comments)   jittery   Hydrocodone Other (See Comments)   Headache,dizziness   Hydroxyzine    Made her very nervous   Sulfa Antibiotics Rash      Medication List    TAKE these medications   acetaminophen 500 MG tablet Commonly known as:  TYLENOL Take 1,000 mg by mouth every 6 (six) hours as needed for moderate pain.   atorvastatin 20 MG tablet Commonly known as:  LIPITOR Take 1 tablet (20 mg total) by mouth every other day.   bisacodyl 5 MG EC tablet Commonly known as:  bisacodyl Take 4 tablets (5 mg each) at 8 am the day before surgery.   ciprofloxacin 500 MG tablet Commonly known as:  CIPRO Take 1 tablet (500 mg total) by mouth 2 (two) times daily for 7 days.   Co Q-10 100 MG Caps Take 100 mg by mouth daily.   diazepam 2 MG tablet Commonly known as:  VALIUM Take 0.5 tablets (1 mg total) by mouth every 12 (twelve) hours as needed for anxiety.   esomeprazole 40 MG capsule Commonly known as:  NEXIUM Take 40 mg by mouth every morning.   Fish Oil 1000 MG Caps Take 1,000 mg by mouth daily.   GAVILYTE-N WITH FLAVOR PACK 420 g solution Generic drug:  polyethylene glycol-electrolytes   hydroxypropyl methylcellulose / hypromellose 2.5 % ophthalmic solution Commonly known as:  ISOPTO TEARS / GONIOVISC Place 1 drop into both eyes daily.  loratadine 10 MG tablet Commonly known as:  CLARITIN Take 10 mg by mouth daily as needed for allergies.   meclizine 25 MG tablet Commonly known as:  ANTIVERT Take 25 mg by mouth 3 (three) times daily  as needed for dizziness.   Melatonin 5 MG Caps Take 5 mg by mouth at bedtime.   metoprolol succinate 25 MG 24 hr tablet Commonly known as:  TOPROL-XL Take 1 tablet (25 mg total) by mouth daily.   metroNIDAZOLE 500 MG tablet Commonly known as:  FLAGYL Take 500 mg by mouth 3 (three) times daily. Directions: 2 @ 8 Am, 2@ 2 PM and 2@ 8PM What changed:  Another medication with the same name was added. Make sure you understand how and when to take each.   metroNIDAZOLE 500 MG tablet Commonly known as:  FLAGYL Take 1 tablet (500 mg total) by mouth 3 (three) times daily for 7 days. What changed:  You were already taking a medication with the same name, and this prescription was added. Make sure you understand how and when to take each.   MULTI-VITAMINS Tabs Take 1 tablet by mouth daily.   neomycin 500 MG tablet Commonly known as:  MYCIFRADIN Take two tablets at 8 am, two tablets at 2 pm, and two tablets at 8 pm the day before surgery.   oxyCODONE 5 MG immediate release tablet Commonly known as:  Oxy IR/ROXICODONE Take 1 tablet (5 mg total) by mouth every 6 (six) hours as needed for severe pain or breakthrough pain.   polyethylene glycol powder powder Commonly known as:  GLYCOLAX/MIRALAX 255 grams one bottle for bowel prep. Use as directed.   SUMAtriptan 100 MG tablet Commonly known as:  IMITREX Take 100 mg by mouth every 2 (two) hours as needed for migraine.   Vitamin D3 25 MCG (1000 UT) Caps Take 1,000 Units by mouth daily.        Follow-up Information    Pabon, [place] F, MD. Schedule an appointment as soon as possible for a visit on 08/13/2018.   Specialty:  General Surgery Why:  s/p left colectomy and subsequent diverting ileostomy Contact information: [address] [place] [address] [phone]            -- Arlen Dupuis , PA-C [facility]  08/07/2018, 10:19 AM [phone] M-F: 7am - 4pm

## 2018-08-07 NOTE — Care Management Note (Signed)
Case Management Note  Patient Details  Name: Whitney Cooper MRN: [id number] Date of Birth: 1960-12-12  Subjective/Objective:      Patient is discharging today to home.  RNCM to bedside; patient feels very confident she and her mother can independently care for ostomy.  She changed pouch today with WOC.  Her mother was also present.  Offered home health services again and patient declines.  She is currently ambulating well and has decreased pain today.  She is looking forward to discharging.                Action/Plan:   Expected Discharge Date:  08/07/18               Expected Discharge Plan:  Home/Self Care  In-House Referral:     Discharge planning Services  CM Consult  Post Acute Care Choice:    Choice offered to:     DME Arranged:    DME Agency:     HH Arranged:    HH Agency:     Status of Service:  Completed, signed off  If discussed at [employer] of Stay Meetings, dates discussed:    Additional Comments:  Elza Rafter, RN 08/07/2018, 12:13 PM

## 2018-08-07 NOTE — Consult Note (Signed)
[facility] Nurse ostomy follow up Stoma type/location: RLQ loop ileostomy with stomal bridge/rod (red rubber catheter) sutured in place at 12 and 6 o'clock.  Proximal limb at 9 o'clock. Stomal assessment/size: 1 and 1/4 inch round, slightly budded, red, moist, edematous Peristomal assessment: intact Treatment options for stomal/peristomal skin: skin barrier ring Output: thin light yellow effluent Ostomy pouching: 1pc.convex with skin barrier ring Education provided:  Patient can empty pouch independently. She is assisted in the pouch change procedure today and her mother observed. Enrolled patient in [place] Start Discharge program: Yes  Patient has declined [facility].  Shown online resource: psag.wocn.org (peristomal skin assessment guide for consumers). She is given my contact information for questions that arise post discharge. Several pouches are sent home with patient.  An ostomy belt is applied and she is taught how to apply and remove. She indicates she may wear this when she returns to work and for her first time at church, etc. for extra confidence.We discuss odor and how the ileum effluent usually has less odor than the stool that might come from a colostomy. After all patient and family questions have been answered, family and patient express appreciation for surgical after care.  Dr. Dahlia Byes has cleared for discharge later today. He will remove the bridge in the office next week. Patient instructed to take a spare pouch and ring with her to that appointment.  [facility] nursing team will follow, and will remain available to this patient, the nursing, surgical and medical teams.   Thank you for involving [place] in the care of this patient.  Maudie Flakes, MSN, RN, [place], [facility]  Pager# [phone]

## 2018-08-07 NOTE — Progress Notes (Signed)
Patient cleared for discharge. [facility] nurse provided extra supplies and walked patient through changing her ostomy pouch. Supplies to dress incision and jp drain sight given. Patient given drain log and containers to measure output. IV removed. Belongings gathered. Pharmacy verified. All patient questions answered.

## 2018-08-07 NOTE — Discharge Instructions (Signed)
In addition to included general post-operative instructions,  Diet: Resume home diet.   Activity: No heavy lifting >20 pounds (children, pets, laundry, garbage) or strenuous activity until follow-up, but light activity and walking are encouraged. Do not drive or drink alcohol if taking narcotic pain medications.  Wound care: You may shower/get incision wet with soapy water and pat dry (do not rub incisions), but no baths or submerging incision underwater until follow-up.   Medications: Resume all home medications. For mild to moderate pain: acetaminophen (Tylenol) or ibuprofen/naproxen (if no kidney disease). Combining Tylenol with alcohol can substantially increase your risk of causing liver disease. Narcotic pain medications, if prescribed, can be used for severe pain, though may cause nausea, constipation, and drowsiness. Do not combine Tylenol and Percocet (or similar) within a 6 hour period as Percocet (and similar) contain(s) Tylenol. If you do not need the narcotic pain medication, you do not need to fill the prescription.  Call office [phone] / [phone]) at any time if any questions, worsening pain, fevers/chills, bleeding, drainage from incision site, or other concerns.

## 2018-08-13 ENCOUNTER — Encounter: Payer: Self-pay | Admitting: Surgery

## 2018-08-13 ENCOUNTER — Ambulatory Visit (INDEPENDENT_AMBULATORY_CARE_PROVIDER_SITE_OTHER): Payer: 59 | Admitting: Surgery

## 2018-08-13 ENCOUNTER — Other Ambulatory Visit: Payer: Self-pay

## 2018-08-13 VITALS — BP 121/90 | HR 99 | Temp 97.5°F | Resp 16 | Ht 64.0 in | Wt 129.8 lb

## 2018-08-13 DIAGNOSIS — Z932 Ileostomy status: Secondary | ICD-10-CM | POA: Diagnosis not present

## 2018-08-13 DIAGNOSIS — Z09 Encounter for follow-up examination after completed treatment for conditions other than malignant neoplasm: Secondary | ICD-10-CM

## 2018-08-13 NOTE — Progress Notes (Signed)
S/p sigmoid colectomy w contained leak requiring loop colostomy and drain placement Doing well Taking PO. Minimal Drain output   PE NAd Abd: soft, nt, incision c/d/i, staples intact. No infection. Ileostomy pink and patent. Red rubber removed. Ileostomy bag changed using a first layer of Dermabond followed by mastisol a ring and the bag respectively. There was some redness on the skin around ileostomy.  A/P Doing well RTC 1 week for staple removal

## 2018-08-13 NOTE — Patient Instructions (Addendum)
Please see your follow up appointment listed below.   Please try to wait 5-7 days before changing if possible.   Be sure to stay hydrated. You may use Pedialyte.    Please keep a dressing over the drain site until it heals completely.  Be sure to keep the incision clean. You may use some gauze and water to clean with. Be sure to apply dressing to the area to prevent any spillage of stool into this area.

## 2018-08-15 ENCOUNTER — Telehealth: Payer: Self-pay | Admitting: Surgery

## 2018-08-15 ENCOUNTER — Telehealth: Payer: Self-pay | Admitting: *Deleted

## 2018-08-15 NOTE — Telephone Encounter (Signed)
Case mananger from the hospital is calling in regards to the patient and asked if someone would call the patient in regards of her dressings said it keeps coming off and may need a nurse to come out and have it changed. Please call patient and advise.

## 2018-08-15 NOTE — Telephone Encounter (Signed)
Spoke with patient. The colostomy bag that Dr.Pabon  applied at her office visit yesterday fell off.  She is requesting home health care. I let patient know we would begin the process and give her a call back.   Reached out to [facility] however they do not except her insurance.   Placed call to [facility]. Written order, last office notes, insurance card faxed to [phone] with [facility]     Patient notified that order has been sent and she should receive a call within 24-48 hours per [place].  If not patient to call [facility], number was provided.    [name] has sent the order below:    Letter by Lesly Rubenstein, LPN on 10/09/2332     [facility] [address] [place], [place]  [address] Phone:  [phone]   Fax:  [phone]  August 15, 2018            [facility]  Phone: [phone] Fax: [phone]  Patient: Whitney Cooper DOB: [birth year] MRN: [id number]  Colostomy Care  Diagnosis: Diverticulitis of Colon, K57.32    To Whom This May Concern, patient needs assistance with applying all devises for colostomy care.   Please arrange home health care twice a week.  Please check with patient prior to ordering any supplies.   If you have any questions or concerns, please don't hesitate to call.   Sincerely,    Dr Caroleen Hamman

## 2018-08-18 ENCOUNTER — Telehealth: Payer: Self-pay | Admitting: *Deleted

## 2018-08-18 NOTE — Telephone Encounter (Signed)
Melissa from [facility] called regarding the referral that you had sent them. She stated that they are going to have to declined due to staffing.  Melissa also said that they do accept all insurances for wound vacs.   Melissa [phone]

## 2018-08-19 NOTE — Telephone Encounter (Signed)
Spoke with Whitney Cooper today to let her know that [facility] declined her due to staffing.   She will discuss  home health care at office visit on 08/20/2018.

## 2018-08-20 ENCOUNTER — Other Ambulatory Visit: Payer: Self-pay

## 2018-08-20 ENCOUNTER — Encounter: Payer: Self-pay | Admitting: Surgery

## 2018-08-20 ENCOUNTER — Ambulatory Visit (INDEPENDENT_AMBULATORY_CARE_PROVIDER_SITE_OTHER): Payer: 59 | Admitting: Surgery

## 2018-08-20 VITALS — BP 146/96 | HR 60 | Temp 97.9°F | Resp 16 | Ht 64.0 in | Wt 126.8 lb

## 2018-08-20 DIAGNOSIS — Z09 Encounter for follow-up examination after completed treatment for conditions other than malignant neoplasm: Secondary | ICD-10-CM

## 2018-08-20 NOTE — Patient Instructions (Addendum)
Please see your appointment listed below.   Please take Imodium three times daily. Continue to stay hydrated.

## 2018-08-20 NOTE — Progress Notes (Signed)
S/p sigmoid colectomy w contained leak requiring loop colostomy and drain placement Doing well Taking PO, good ostomy output. She does report some liquid stool She has had the colostomy for 6 days, the one that I placed lasted for only 7hrs   PE NAD Abd: soft, nt, incision c/d/i, staples removed. No infection. Ileostomy pink and patent. . Ileostomy bag changed using a first layer of Dermabond followed by mastisol a ring and the bag respectively. There was some redness on the skin around ileostomy. No peritonitis   A/P Doing well Add imodium RTC 1 week

## 2018-08-26 ENCOUNTER — Telehealth: Payer: Self-pay | Admitting: Surgery

## 2018-08-26 NOTE — Telephone Encounter (Signed)
Patient is calling said she is having some pain in her upper on the right side of there rib cage said her pain is about a  4 or 5. Please call patient and advise.

## 2018-08-26 NOTE — Telephone Encounter (Signed)
Right rib cage pain. She stated she was up and about a lot yesterday and is wondering if she has pulled a muscle.   She denies fever 97.1 at the time of our She is urinating ok. She denies shortness of breathing or any breathing problems at this time. She is still taking imodium.Was instructed to take tylenol or ibuprofen and try heating pad to the area. Patient has appointment 08/27/2018 to see Dr.Pabon. If pain worsens she will call and let us know.

## 2018-08-27 ENCOUNTER — Other Ambulatory Visit: Payer: Self-pay

## 2018-08-27 ENCOUNTER — Ambulatory Visit (INDEPENDENT_AMBULATORY_CARE_PROVIDER_SITE_OTHER): Payer: 59 | Admitting: Surgery

## 2018-08-27 ENCOUNTER — Encounter: Payer: Self-pay | Admitting: Surgery

## 2018-08-27 VITALS — BP 110/66 | HR 68 | Temp 97.5°F | Ht 63.0 in | Wt 127.0 lb

## 2018-08-27 DIAGNOSIS — Z09 Encounter for follow-up examination after completed treatment for conditions other than malignant neoplasm: Secondary | ICD-10-CM

## 2018-08-27 NOTE — Patient Instructions (Signed)
Patient will need to return to the office in 3 weeks with Dr.Pabon  Call the office with any questions or concerns.

## 2018-08-27 NOTE — Progress Notes (Signed)
S/p diverting loop and sigmoid colectomy Doing well Taking PO Pain improving Bag stayed in for 7 days  PE NAD Abd soft, nt, incision c/d/i, no infection. Ileostomy bag changed. Some erythema around os. Powder and ring placed. Good seal  A/p Doing very well Liberalize diet RTC 3 weeks

## 2018-09-02 ENCOUNTER — Ambulatory Visit: Payer: 59 | Admitting: Family Medicine

## 2018-09-02 ENCOUNTER — Encounter: Payer: Self-pay | Admitting: Family Medicine

## 2018-09-02 VITALS — BP 122/76 | HR 97 | Temp 98.1°F | Ht 64.0 in | Wt 125.7 lb

## 2018-09-02 DIAGNOSIS — Z9049 Acquired absence of other specified parts of digestive tract: Secondary | ICD-10-CM

## 2018-09-02 DIAGNOSIS — R634 Abnormal weight loss: Secondary | ICD-10-CM | POA: Diagnosis not present

## 2018-09-02 DIAGNOSIS — R946 Abnormal results of thyroid function studies: Secondary | ICD-10-CM

## 2018-09-02 DIAGNOSIS — R5383 Other fatigue: Secondary | ICD-10-CM

## 2018-09-02 DIAGNOSIS — F418 Other specified anxiety disorders: Secondary | ICD-10-CM

## 2018-09-02 DIAGNOSIS — R197 Diarrhea, unspecified: Secondary | ICD-10-CM | POA: Diagnosis not present

## 2018-09-02 DIAGNOSIS — E2839 Other primary ovarian failure: Secondary | ICD-10-CM

## 2018-09-02 DIAGNOSIS — D649 Anemia, unspecified: Secondary | ICD-10-CM

## 2018-09-02 MED ORDER — DIAZEPAM 2 MG PO TABS: 0.2500 mg | ORAL_TABLET | Freq: Three times a day (TID) | ORAL | 0 refills | Status: DC | PRN

## 2018-09-02 NOTE — Assessment & Plan Note (Signed)
Check thyroid test today

## 2018-09-02 NOTE — Progress Notes (Signed)
BP 122/76   Pulse 97   Temp 98.1 F (36.7 C)   Ht 5\' 4"  (1.626 m)   Wt 125 lb 11.2 oz (57 kg)   SpO2 98%   BMI 21.58 kg/m    Subjective:    Patient ID: Whitney Cooper, female    DOB: 04/19/1961, 58 y.o.   MRN: [id number]  HPI: Whitney Cooper is a 58 y.o. female  Chief Complaint  Patient presents with  . Hospitalization Follow-up    HPI Patient is here for hospital follow-up She was admitted on July 29, 2018 with diverticulitis She underwent an exploratory lap on August 03, 2018 with diverting loop and sigmoid colectomy She saw Dr. Dahlia Byes on Jan 22nd, Jan 29th, and Feb 5th Most recent note reviewed; she is doing very well, liberalize diet, return to clinic in 3 weeks She says her ostomy stump is oblong, skin around it was pretty raw at one point, too much skin showing around stoma and that has helped to decrease the amount of skin around it Had diarrhea really bad, horrible water filling up on Sunday night; lost two pounds; has gained a pound back; foul smell; it is thick again and it is better; odor is very mild; they checked for C diff in the hospital; will get pasty, but kicks in at supper; not eating much for supper; [place] and cheese or deli chicken; main meal in the middle of the day; no fevers; sweating some at night, but checking temp is okay Feels weak and washed out; has a dietician who is going to help her; he says to liberalize diet, but she is worried about getting a blockage; taking imodium three times a day with surgeon's blessing  Lost of anxiety Just extremely anxious and nervous; just wanted to sit down and take a nap and could not let go enough to even relax; did sleep four hours last night; anxiety is focused on her health really; little overwhelming; worried about the stoma, is it impacted or stopped up; took a valium last night before going to bed; needed something to take; the valium was prescribed just for worst anxiety; it's trying to get hold of her; she  has her bottle of valium with 6.5 pills of the 2 mg strength   GAD 7 : Generalized Anxiety Score 06/26/2018 04/24/2018 11/04/2017  Nervous, Anxious, on Edge 1 2 3   Control/stop worrying 2 3 3   Worry too much - different things 2 3 3   Trouble relaxing 1 3 3   Restless 1 3 3   Easily annoyed or irritable 1 2 2   Afraid - awful might happen 2 2 1   Total GAD 7 Score 10 18 18   Anxiety Difficulty Somewhat difficult Somewhat difficult Somewhat difficult      Depression screen [facility] 2/9 09/02/2018 06/26/2018 02/13/2018 12/13/2017 11/04/2017  Decreased Interest 1 1 0 0 2  Down, Depressed, Hopeless 1 0 0 0 1  PHQ - 2 Score 2 1 0 0 3  Altered sleeping 1 1 0 - 0  Tired, decreased energy 3 2 0 - 3  Change in appetite 0 1 0 - 0  Feeling bad or failure about yourself  0 1 0 - 0  Trouble concentrating 0 0 0 - 0  Moving slowly or fidgety/restless 0 0 0 - 0  Suicidal thoughts 0 0 0 - 0  PHQ-9 Score 6 6 0 - 6  Difficult doing work/chores Not difficult at all Somewhat difficult Not difficult at all -  Not difficult at all   Fall Risk  09/02/2018 08/27/2018 08/20/2018 08/13/2018 07/18/2018  Falls in the past year? 0 0 0 0 0  Number falls in past yr: - - - - 0  Injury with Fall? - - - - 0    Relevant past medical, surgical, family and social history reviewed Past Medical History:  Diagnosis Date  . Beat, premature ventricular 06/21/2015  . Bundle branch block    palpitations a few weeks ago  . Diverticulitis of colon 07/07/2009  . Dry eyes   . Essential (primary) hypertension 06/21/2015  . Fibromyalgia   . GERD (gastroesophageal reflux disease)   . Headache    migraine  . History of chicken pox   . History of kidney stones   . Hyperlipidemia   . Hypertension   . IFG (impaired fasting glucose) 01/24/2015  . OAB (overactive bladder)   . Osteoarthritis   . Osteopenia determined by x-ray 02/10/2016  . Recurrent UTI   . Urinary incontinence   . Vertigo    Past Surgical History:  Procedure Laterality  Date  . COLONOSCOPY WITH PROPOFOL N/A 11/11/2015   Procedure: COLONOSCOPY WITH PROPOFOL;  Surgeon: Lollie Sails, MD;  Location: [facility] ENDOSCOPY;  Service: Endoscopy;  Laterality: N/A;  . COLONOSCOPY WITH PROPOFOL N/A 06/24/2018   Procedure: COLONOSCOPY WITH PROPOFOL;  Surgeon: Lollie Sails, MD;  Location[facility] ENDOSCOPY;  Service: Endoscopy;  Laterality: N/A;  . ESOPHAGOGASTRODUODENOSCOPY  11/13/2013  . ILEOSTOMY N/A 08/03/2018   Procedure: ILEOSTOMY;  Surgeon: Jules Husbands, MD;  Location: [facility] ORS;  Service: General;  Laterality: N/A;  . LAPAROSCOPIC RIGHT COLECTOMY N/A 07/29/2018   Procedure: LAPAROSCOPIC SIGMOID COLECTOMY;  Surgeon: Jules Husbands, MD;  Location: [facility] ORS;  Service: General;  Laterality: N/A;  . LAPAROTOMY N/A 08/03/2018   Procedure: EXPLORATORY LAPAROTOMY;  Surgeon: Jules Husbands, MD;  Location: [facility] ORS;  Service: General;  Laterality: N/A;  . OOPHORECTOMY    . TOTAL ABDOMINAL HYSTERECTOMY  Jan 2012   Complete, due to heavy bleeding and grape-fruit size fibroid    Family History  Problem Relation Age of Onset  . Hypertension Mother   . Cancer Mother        skin  . Heart disease Father        CHF  . Hypertension Father   . Stroke Father        mini strokes  . Cancer Father        skin  . Heart attack Father   . Thyroid disease Father   . Ovarian cancer Maternal Grandmother 66  . Cancer Maternal Grandmother        ovarian/brain  . Heart attack Maternal Grandfather   . Heart attack Paternal Grandfather   . Diabetes Maternal Uncle   . Thyroid disease Sister   . Breast cancer Sister   . Kidney disease Paternal Grandmother    Social History   Tobacco Use  . Smoking status: Never Smoker  . Smokeless tobacco: Never Used  Substance Use Topics  . Alcohol use: No  . Drug use: Never     Office Visit from 09/02/2018 in [facility]  AUDIT-C Score  0      Interim medical history since last visit reviewed. Allergies and  medications reviewed  Review of Systems Per HPI unless specifically indicated above     Objective:    BP 122/76   Pulse 97   Temp 98.1 F (36.7 C)   Ht 5'  4" (1.626 m)   Wt 125 lb 11.2 oz (57 kg)   SpO2 98%   BMI 21.58 kg/m   Wt Readings from Last 3 Encounters:  09/02/18 125 lb 11.2 oz (57 kg)  08/27/18 127 lb (57.6 kg)  08/20/18 126 lb 12.8 oz (57.5 kg)  MD note: 122 pounds at home with pajamas; down 10 pounds over the last month  Physical Exam Constitutional:      General: She is not in acute distress.    Appearance: She is well-developed and normal weight.  Eyes:     General: No scleral icterus. Cardiovascular:     Rate and Rhythm: Normal rate and regular rhythm.  Pulmonary:     Effort: Pulmonary effort is normal.     Breath sounds: Normal breath sounds.  Abdominal:     General: Abdomen is flat. Bowel sounds are normal. There is no distension.     Palpations: Abdomen is soft.       Comments: Ostomy site reddish-pink, moist, pasty light to medium brown stool at opening; midline surgical incision c/d/i with steri-strips in place  Neurological:     Mental Status: She is alert.  Psychiatric:        Mood and Affect: Mood is anxious. Mood is not depressed or elated.        Speech: Speech is not rapid and pressured or tangential.        Behavior: Behavior normal. Behavior is not hyperactive.        Cognition and Memory: Cognition normal.        Judgment: Judgment normal.     Comments: Good eye contact with examiner; does appear anxious; not tearful        Assessment & Plan:   Problem List Items Addressed This Visit      Other   S/P colectomy    Ostomy site intact; patient more comfortable with pouch changes now but still very anxious about potential for blockage, problems      Abnormal thyroid function test    Check thyroid test today       Other Visit Diagnoses    Anxiety about health    -  Primary   supportive listening; will provide a few more of  the benzo; PMP Aware reviewed; disucssed potential for addiction; never mix with pain pills, EtOH; she agrees   Relevant Medications   diazepam (VALIUM) 2 MG tablet   Hypocalcemia       recheck Ca2+, as well as vit D, phos   Relevant Orders   VITAMIN D 25 Hydroxy (Vit-D Deficiency, Fractures)   Phosphorus   Comprehensive metabolic panel   Abnormal weight loss       check thyroid function; may be related to anxiety over eating, changes with ostomy and concern about what's coming through GI tract   Relevant Orders   Vitamin B12   TSH   Comprehensive metabolic panel   Diarrhea, unspecified type       improved; patient has more formed stool now; advised that C diff can develop even up to two months after treatment with antibiotics   Relevant Orders   CBC   Magnesium   Comprehensive metabolic panel   Other fatigue       Relevant Orders   Vitamin B12   CBC   TSH   Comprehensive metabolic panel   Anemia, unspecified type       recheck labs today   Estrogen deficiency       DEXA  ordered   Relevant Orders   DG Bone Density       Follow up plan: Return in about 1 month (around 10/01/2018) for follow-up visit with Dr. Sanda Klein.  An after-visit summary was printed and given to the patient at [place].  Please see the patient instructions which may contain other information and recommendations beyond what is mentioned above in the assessment and plan.  Meds ordered this encounter  Medications  . diazepam (VALIUM) 2 MG tablet    Sig: Take 0.5 tablets (1 mg total) by mouth every 8 (eight) hours as needed for anxiety.    Dispense:  15 tablet    Refill:  0    Orders Placed This Encounter  Procedures  . DG Bone Density  . Vitamin B12  . CBC  . Magnesium  . TSH  . VITAMIN D 25 Hydroxy (Vit-D Deficiency, Fractures)  . Phosphorus  . Comprehensive metabolic panel

## 2018-09-02 NOTE — Patient Instructions (Signed)
Do call Dr. Dahlia Byes with any questions or concerns about your stoma or intestinal issues We'll get labs today If you have not heard anything from my staff in a week about any orders/referrals/studies from today, please contact us here to follow-up [phone]  12 Ways to Curb Anxiety  ?Anxiety is normal human sensation. It is what helped our ancestors survive the pitfalls of the wilderness. Anxiety is defined as experiencing worry or nervousness about an imminent event or something with an uncertain outcome. It is a feeling experienced by most people at some point in their lives. Anxiety can be triggered by a very personal issue, such as the illness of a loved one, or an event of global proportions, such as a refugee crisis. Some of the symptoms of anxiety are:  Feeling restless.  Having a feeling of impending danger.  Increased heart rate.  Rapid breathing. Sweating.  Shaking.  Weakness or feeling tired.  Difficulty concentrating on anything except the current worry.  Insomnia.  Stomach or bowel problems. What can we do about anxiety we may be feeling? There are many techniques to help manage stress and relax. Here are 12 ways you can reduce your anxiety almost immediately: 1. Turn off the constant feed of information. Take a social media sabbatical. Studies have shown that social media directly contributes to social anxiety.  2. Monitor your television viewing habits. Are you watching shows that are also contributing to your anxiety, such as 24-hour news stations? Try watching something else, or better yet, nothing at all. Instead, listen to music, read an inspirational book or practice a hobby. 3. Eat nutritious meals. Also, don't skip meals and keep healthful snacks on hand. Hunger and poor diet contributes to feeling anxious. 4. Sleep. Sleeping on a regular schedule for at least seven to eight hours a night will do wonders for your outlook when you are awake. 5. Exercise. Regular exercise  will help rid your body of that anxious energy and help you get more restful sleep. 6. Try deep (diaphragmatic) breathing. Inhale slowly through your nose for five seconds and exhale through your mouth. 7. Practice acceptance and gratitude. When anxiety hits, accept that there are things out of your control that shouldn't be of immediate concern.  8. Seek out humor. When anxiety strikes, watch a funny video, read jokes or call a friend who makes you laugh. Laughter is healing for our bodies and releases endorphins that are calming. 9. Stay positive. Take the effort to replace negative thoughts with positive ones. Try to see a stressful situation in a positive light. Try to come up with solutions rather than dwelling on the problem. 10. Figure out what triggers your anxiety. Keep a journal and make note of anxious moments and the events surrounding them. This will help you identify triggers you can avoid or even eliminate. 11. Talk to someone. Let a trusted friend, family member or even trained professional know that you are feeling overwhelmed and anxious. Verbalize what you are feeling and why.  12. Volunteer. If your anxiety is triggered by a crisis on a large scale, become an advocate and work to resolve the problem that is causing you unease. Anxiety is often unwelcome and can become overwhelming. If not kept in check, it can become a disorder that could require medical treatment. However, if you take the time to care for yourself and avoid the triggers that make you anxious, you will be able to find moments of relaxation and clarity that make  your life much more enjoyable.

## 2018-09-02 NOTE — Assessment & Plan Note (Addendum)
Ostomy site intact; patient more comfortable with pouch changes now but still very anxious about potential for blockage, problems

## 2018-09-03 NOTE — Progress Notes (Signed)
Whitney Cooper, please let the patient know that her red blood cell count is normal, but her H/H is pending (not sure why, check with lab) Vitamin B12 is fine Magnesium normal, phosphorus is normal; calcium is normal Thyroid just a hair outside normal range (literally, just 4 hundredths of a point off), so we'll just follow that Vitamin D is normal, but lower end of normal; advise a little extra time near windows, out of doors to get some sunshine

## 2018-09-04 LAB — COMPREHENSIVE METABOLIC PANEL
ALT: 29 IU/L (ref 0–32)
AST: 24 IU/L (ref 0–40)
Albumin/Globulin Ratio: 1.8 (ref 1.2–2.2)
Albumin: 4.4 g/dL (ref 3.8–4.9)
Alkaline Phosphatase: 70 IU/L (ref 39–117)
BUN/Creatinine Ratio: 10 (ref 9–23)
BUN: 8 mg/dL (ref 6–24)
Bilirubin Total: 0.3 mg/dL (ref 0.0–1.2)
CO2: 21 mmol/L (ref 20–29)
Calcium: 10.1 mg/dL (ref 8.7–10.2)
Chloride: 99 mmol/L (ref 96–106)
Creatinine, Ser: 0.82 mg/dL (ref 0.57–1.00)
GFR calc Af Amer: 91 mL/min/{1.73_m2} (ref >59)
GFR calc non Af Amer: 79 mL/min/{1.73_m2} (ref >59)
Globulin, Total: 2.5 g/dL (ref 1.5–4.5)
Glucose: 89 mg/dL (ref 65–99)
Potassium: 4.7 mmol/L (ref 3.5–5.2)
Sodium: 138 mmol/L (ref 134–144)
Total Protein: 6.9 g/dL (ref 6.0–8.5)

## 2018-09-04 LAB — CBC
Hematocrit: 40.4 % (ref 34.0–46.6)
Hemoglobin: 13.4 g/dL (ref 11.1–15.9)
MCH: 28.9 pg (ref 26.6–33.0)
MCHC: 33.2 g/dL (ref 31.5–35.7)
MCV: 87 fL (ref 79–97)
Platelets: 326 10*3/uL (ref 150–450)
RBC: 4.64 x10E6/uL (ref 3.77–5.28)
RDW: 12.9 % (ref 11.7–15.4)
WBC: 9.2 10*3/uL (ref 3.4–10.8)

## 2018-09-04 LAB — VITAMIN B12: Vitamin B-12: 633 pg/mL (ref 232–1245)

## 2018-09-04 LAB — VITAMIN D 25 HYDROXY (VIT D DEFICIENCY, FRACTURES): Vit D, 25-Hydroxy: 32 ng/mL (ref 30.0–100.0)

## 2018-09-04 LAB — PHOSPHORUS: Phosphorus: 4.1 mg/dL (ref 3.0–4.3)

## 2018-09-04 LAB — TSH: TSH: 4.54 u[IU]/mL — ABNORMAL HIGH (ref 0.450–4.500)

## 2018-09-04 LAB — MAGNESIUM: Magnesium: 1.7 mg/dL (ref 1.6–2.3)

## 2018-09-04 NOTE — Progress Notes (Signed)
Whitney Cooper, please let the patient know that her CBC is completely normal. She is not anemic, and everything appears to have normalized; good news; thank you for checking with the lab

## 2018-09-05 ENCOUNTER — Telehealth: Payer: Self-pay

## 2018-09-05 NOTE — Telephone Encounter (Signed)
Late entry-Patient called this RNCM work cell phone on 08-15-18 asking about setting up home health services; she states she is having a hard time handling her ostomy since her discharge on 08-07-18.  Patient had declined home health services twice while inpatient including on day of discharge.  Patient was told on 08-15-18 she would need to speak with PCP or surgery center to set up home health services at this time.  Patient verbalized understanding.  This RNCM called the surgery center and spoke with the nurse letting the nurse know about patient concerns.  The nurse stated she will call patient right away.  This RNCM called patient back and asked her to call me back if she does not hear from the nurse at the surgery center by 12:00.  The patient did not call this RNCM back.

## 2018-09-05 NOTE — Telephone Encounter (Signed)
Lead RNCM asked to do service recovery for this patient as patient was not happy with her discharge transition.  RNCM reached out to patient by phone. Her voice was strong sounding and she admits that she and her mother are getting ready to go out for lunch.  She states she has just been extremely overwhelmed. She states that she was showed how to change ostomy on day 4 in the hospital. She said she was offered home health services and "I guess if you say no to home health you're just on your own".  She states she made an appointment with her primary care but states she feels better and her "labs were good".  She states she just don't want this to happen to anyone else.  I asked if she could explain that to me. She said that "once you refused home health services- its a done deal".  She feels she was given the run around after the fact when she realized she had taken on more than she could handle. She shared that her ostomy site is not the shape of the diagrams or the photo they shared with her during education.  She said that after she agreed to home health "at least one visit" it was written but no one ever came. She said this was "after discharge".  I told her I appreciated that she was in a better place now and that I was sorry she was going through all these changes.  I encouraged her to stay in touch with this RNCM for any future needs.  She agreed.

## 2018-09-10 DIAGNOSIS — Z932 Ileostomy status: Secondary | ICD-10-CM | POA: Diagnosis not present

## 2018-09-15 ENCOUNTER — Other Ambulatory Visit: Payer: Self-pay

## 2018-09-15 ENCOUNTER — Encounter: Payer: Self-pay | Admitting: *Deleted

## 2018-09-15 ENCOUNTER — Ambulatory Visit (INDEPENDENT_AMBULATORY_CARE_PROVIDER_SITE_OTHER): Payer: 59 | Admitting: Surgery

## 2018-09-15 ENCOUNTER — Encounter: Payer: Self-pay | Admitting: Surgery

## 2018-09-15 VITALS — BP 140/83 | HR 110 | Temp 97.7°F | Ht 64.0 in | Wt 126.2 lb

## 2018-09-15 DIAGNOSIS — K5732 Diverticulitis of large intestine without perforation or abscess without bleeding: Secondary | ICD-10-CM

## 2018-09-15 MED ORDER — POLYETHYLENE GLYCOL 3350 17 GM/SCOOP PO POWD: ORAL | 0 refills | Status: DC

## 2018-09-15 MED ORDER — BISACODYL 5 MG PO TBEC: DELAYED_RELEASE_TABLET | ORAL | 0 refills | Status: DC

## 2018-09-15 NOTE — Progress Notes (Signed)
Patient has been scheduled for a barium enema for 10-08-18 at 9 am (arrive 8:45 am). Prep: patient given handout regarding bowel prep starting the day prior, 10-07-18.   Bowel prep medications (miralax and dulcolax) have been sent in to [facility] per patient's request.   The patient will follow up in the office with Dr. Dahlia Byes as scheduled for 10-20-18 at 9 am.

## 2018-09-15 NOTE — Patient Instructions (Addendum)
Patient is to return to the office in 1 month. Continue to work on [employer]. Patient will need a barium enema before her follow up.  Call the office with any questions or concerns.

## 2018-09-15 NOTE — Progress Notes (Signed)
S/p loop ileostomy for anastomotic leak  Doing very well Taking PO, no fevers Ostomy working well More energy  PE NAD Abd: soft, nt, ileostomy working  A/P Doing very well We will obtain barium enema in 4 weeks and I will see her right after that to talk about ileostomy takedown

## 2018-09-25 ENCOUNTER — Telehealth: Payer: Self-pay | Admitting: Nurse Practitioner

## 2018-09-25 ENCOUNTER — Telehealth: Payer: Self-pay | Admitting: *Deleted

## 2018-09-25 DIAGNOSIS — I10 Essential (primary) hypertension: Secondary | ICD-10-CM

## 2018-09-25 NOTE — Telephone Encounter (Signed)
Message left for patient to call the office.   Per Dr. Dahlia Byes, he just wants to look at the lower part and no prep is needed. We just need to inform patient of this.

## 2018-09-25 NOTE — Telephone Encounter (Signed)
Patient called and has some questions in regards the procedure that she is going to have on 10/08/18

## 2018-09-25 NOTE — Telephone Encounter (Signed)
  Wt Readings from Last 3 Encounters:  09/15/18 126 lb 3.2 oz (57.2 kg)  09/02/18 125 lb 11.2 oz (57 kg)  08/27/18 127 lb (57.6 kg)   Temp Readings from Last 3 Encounters:  09/15/18 97.7 F (36.5 C) (Temporal)  09/02/18 98.1 F (36.7 C)  08/27/18 (!) 97.5 F (36.4 C) (Skin)   BP Readings from Last 3 Encounters:  09/15/18 140/83  09/02/18 122/76  08/27/18 110/66   Pulse Readings from Last 3 Encounters:  09/15/18 (!) 110  09/02/18 97  08/27/18 68   It looks like patient would have run out of her beta-blocker in January  I see the trend upward in BP and pulse, especially at surgeon's office last I called her to discuss, see if she stopped it, or if still taking, do we need to increase Left msg asking her to call me on mobile #  I called home #, left msg also asking her to call me  I'll refill in the meantime in case she has truly been out, but will want to talk to her to see if on 25 mg, then will want to increase to 50 mg and see her back in 1-2 weeks  CRM created (I hope I did it correctly)

## 2018-09-25 NOTE — Telephone Encounter (Signed)
Patient states she has had a rash on her abdomen that started a few weeks ago and now going around her back. She thinks it could be an irritation from the tape. She states she is using imodium 3 times a day and wants to see if the rash could be from that. Patient states she has been on the imodium for a while. She was told to use benadryl or hydrocortisone cream for symptoms to see if this will help. If symptoms persist, she can follow up with her primary care doctor.   The patient is also asking about the prep for the barium enema that she will be having. Patient was instructed to use the Miralax/Dulcolax prep. Patient thinks that Dr. Dahlia Byes wants to look at the lower portion/large intestine and if so she states that prep would be for the small intestine and would just come out of her bag and not clean out the lower part.   Note to Dr. Dahlia Byes to clarify the above.

## 2018-09-26 ENCOUNTER — Other Ambulatory Visit: Payer: Self-pay

## 2018-09-26 ENCOUNTER — Telehealth: Payer: Self-pay

## 2018-09-26 NOTE — Telephone Encounter (Signed)
Pa 

## 2018-09-26 NOTE — Telephone Encounter (Signed)
Patient called back, states she was in hospital from 1/7-1/16 and that she was given meds then.  So that is  probably why she has not ran out.  She has been taking daily.  Pt has an appt on 3/11 with you and can asses bp and pulse then to see if meds need to be uped

## 2018-09-26 NOTE — Telephone Encounter (Signed)
Patient notified of no prep needed.

## 2018-09-29 NOTE — Telephone Encounter (Signed)
erro  neous encounter

## 2018-10-01 ENCOUNTER — Encounter: Payer: Self-pay | Admitting: Surgery

## 2018-10-01 ENCOUNTER — Encounter: Payer: Self-pay | Admitting: Family Medicine

## 2018-10-01 ENCOUNTER — Ambulatory Visit: Payer: 59 | Admitting: Family Medicine

## 2018-10-01 ENCOUNTER — Ambulatory Visit (INDEPENDENT_AMBULATORY_CARE_PROVIDER_SITE_OTHER): Payer: 59 | Admitting: Surgery

## 2018-10-01 ENCOUNTER — Other Ambulatory Visit: Payer: Self-pay

## 2018-10-01 VITALS — BP 128/80 | HR 99 | Temp 97.9°F | Resp 14 | Ht 64.0 in | Wt 122.3 lb

## 2018-10-01 VITALS — BP 126/81 | HR 99 | Temp 97.5°F | Ht 64.0 in | Wt 121.8 lb

## 2018-10-01 DIAGNOSIS — I10 Essential (primary) hypertension: Secondary | ICD-10-CM | POA: Diagnosis not present

## 2018-10-01 DIAGNOSIS — R197 Diarrhea, unspecified: Secondary | ICD-10-CM

## 2018-10-01 DIAGNOSIS — R102 Pelvic and perineal pain: Secondary | ICD-10-CM | POA: Diagnosis not present

## 2018-10-01 DIAGNOSIS — Z09 Encounter for follow-up examination after completed treatment for conditions other than malignant neoplasm: Secondary | ICD-10-CM

## 2018-10-01 MED ORDER — METOPROLOL SUCCINATE ER 25 MG PO TB24: 25.0000 mg | ORAL_TABLET | Freq: Every day | ORAL | 5 refills | Status: AC

## 2018-10-01 NOTE — Patient Instructions (Addendum)
Try a good moisturizer on the dry areas, especially after bathing Pat dry rather than towel dry  We'll get stool studies and urine tests today Please do go across the hall to the surgeon's office and relay your symptoms

## 2018-10-01 NOTE — Patient Instructions (Signed)
Patient is to return to the office as scheduled.    Call the office with any questions or concerns.

## 2018-10-01 NOTE — Progress Notes (Signed)
BP 128/80    Pulse 99    Temp 97.9 F (36.6 C) (Oral)    Resp 14    Ht 5\' 4"  (1.626 m)    Wt 122 lb 4.8 oz (55.5 kg)    SpO2 98%    BMI 20.99 kg/m    Subjective:    Patient ID: Whitney Cooper, female    DOB: 1960-12-31, 58 y.o.   MRN: [id number]  HPI: Whitney Cooper is a 58 y.o. female  Chief Complaint  Patient presents with   Follow-up    HPI  Blood pressure and heart rate were up at the surgeon Heart rate 110 and BP was 140/83 on Feb 24th She thinks her heart rate is always high We reviewed readings from previous visits; October 2018 pulse 100; she has seen it in the 90s   Having to take Imodium, has been on it for a month Bag will fill up with water; this morning was pretty water; it was thick for a few days, she was so happy, but she is eating the same things all the time Rough dry skin and bumps on the abdomen and left shoulder; bathing well; was using some cleanser to spray on and didn't have to wash off; mostly torso  Poor sleep last night; supposed to have a barium enema on March 18th to make sure things are healing; then sees Dr. Dahlia Byes on March 30th to talk about the reconnect; what if... lots of things going through her mind; very much on her mind  Gets occasional pressure in the pelvic area; getting up at night will bring it on; no strong odor; no burning; does not really feel like a UTI; surgeon said part was trying to attach to the bladder; no air from the urethra; but then she says she was sitting on the bed and had an incontinence pad on and then passed gas and not sure if from rectum or urethra, like a poof of air; that was a couple of weeks ago; thinks it was from the back and trailed forward; she sees bubbles in her urine but she pees a good distance, falls down pretty far, like a raised toilet seat; she thinks they checked for a fistula during her surgery, she says they saw some air and were looking for that  Depression screen [facility] 2/9 10/01/2018 09/02/2018 06/26/2018  02/13/2018 12/13/2017  Decreased Interest 0 1 1 0 0  Down, Depressed, Hopeless 0 1 0 0 0  PHQ - 2 Score 0 2 1 0 0  Altered sleeping 0 1 1 0 -  Tired, decreased energy 0 3 2 0 -  Change in appetite 0 0 1 0 -  Feeling bad or failure about yourself  0 0 1 0 -  Trouble concentrating 0 0 0 0 -  Moving slowly or fidgety/restless 0 0 0 0 -  Suicidal thoughts 0 0 0 0 -  PHQ-9 Score 0 6 6 0 -  Difficult doing work/chores Not difficult at all Not difficult at all Somewhat difficult Not difficult at all -   MD note: doing better, not depressed  Fall Risk  10/01/2018 10/01/2018 09/02/2018 08/27/2018 08/20/2018  Falls in the past year? 0 0 0 0 0  Number falls in past yr: 0 0 - - -  Injury with Fall? 0 0 - - -    Relevant past medical, surgical, family and social history reviewed Past Medical History:  Diagnosis Date   Beat, premature ventricular 06/21/2015  Bundle branch block    palpitations a few weeks ago   Diverticulitis of colon 07/07/2009   Dry eyes    Essential (primary) hypertension 06/21/2015   Fibromyalgia    GERD (gastroesophageal reflux disease)    Headache    migraine   History of chicken pox    History of kidney stones    Hyperlipidemia    Hypertension    IFG (impaired fasting glucose) 01/24/2015   OAB (overactive bladder)    Osteoarthritis    Osteopenia determined by x-ray 02/10/2016   Recurrent UTI    Urinary incontinence    Vertigo    Past Surgical History:  Procedure Laterality Date   COLONOSCOPY WITH PROPOFOL N/A 11/11/2015   Procedure: COLONOSCOPY WITH PROPOFOL;  Surgeon: Lollie Sails, MD;  Location: [facility] ENDOSCOPY;  Service: Endoscopy;  Laterality: N/A;   COLONOSCOPY WITH PROPOFOL N/A 06/24/2018   Procedure: COLONOSCOPY WITH PROPOFOL;  Surgeon: Lollie Sails, MD;  Location: [facility] ENDOSCOPY;  Service: Endoscopy;  Laterality: N/A;   ESOPHAGOGASTRODUODENOSCOPY  11/13/2013   ILEOSTOMY N/A 08/03/2018   Procedure: ILEOSTOMY;  Surgeon: Jules Husbands, MD;  Location: [facility] ORS;  Service: General;  Laterality: N/A;   LAPAROSCOPIC RIGHT COLECTOMY N/A 07/29/2018   Procedure: LAPAROSCOPIC SIGMOID COLECTOMY;  Surgeon: Jules Husbands, MD;  Location: [facility] ORS;  Service: General;  Laterality: N/A;   LAPAROTOMY N/A 08/03/2018   Procedure: EXPLORATORY LAPAROTOMY;  Surgeon: Jules Husbands, MD;  Location: [facility] ORS;  Service: General;  Laterality: N/A;   OOPHORECTOMY     TOTAL ABDOMINAL HYSTERECTOMY  Jan 2012   Complete, due to heavy bleeding and grape-fruit size fibroid    Family History  Problem Relation Age of Onset   Hypertension Mother    Cancer Mother        skin   Heart disease Father        CHF   Hypertension Father    Stroke Father        mini strokes   Cancer Father        skin   Heart attack Father    Thyroid disease Father    Ovarian cancer Maternal Grandmother 66   Cancer Maternal Grandmother        ovarian/brain   Heart attack Maternal Grandfather    Heart attack Paternal Grandfather    Diabetes Maternal Uncle    Thyroid disease Sister    Breast cancer Sister    Kidney disease Paternal Grandmother    Social History   Tobacco Use   Smoking status: Never Smoker   Smokeless tobacco: Never Used  Substance Use Topics   Alcohol use: No   Drug use: Never     Office Visit from 10/01/2018 in [facility]  AUDIT-C Score  0      Interim medical history since last visit reviewed. Allergies and medications reviewed  Review of Systems Per HPI unless specifically indicated above     Objective:    BP 128/80    Pulse 99    Temp 97.9 F (36.6 C) (Oral)    Resp 14    Ht 5\' 4"  (1.626 m)    Wt 122 lb 4.8 oz (55.5 kg)    SpO2 98%    BMI 20.99 kg/m   Wt Readings from Last 3 Encounters:  10/01/18 121 lb 12.8 oz (55.2 kg)  10/01/18 122 lb 4.8 oz (55.5 kg)  09/15/18 126 lb 3.2 oz (57.2 kg)    Physical Exam Constitutional:  Appearance: She is well-developed.  Eyes:      General: No scleral icterus. Cardiovascular:     Rate and Rhythm: Normal rate and regular rhythm.  Pulmonary:     Effort: Pulmonary effort is normal.     Breath sounds: Normal breath sounds.  Abdominal:     Tenderness: There is abdominal tenderness in the periumbilical area and suprapubic area.     Comments: Ostomy stump dark pink, producing soft brown stool; very mild tenderness over suprapubic area, near incision site (which appears c/d/i)  Psychiatric:        Behavior: Behavior normal.        Assessment & Plan:   Problem List Items Addressed This Visit      Cardiovascular and Mediastinum   Essential (primary) hypertension (Chronic)    Controlled here today; continue med      Relevant Medications   metoprolol succinate (TOPROL-XL) 25 MG 24 hr tablet    Other Visit Diagnoses    Diarrhea, unspecified type    -  Primary   will check gastrointestinal panel, as patient reports intermittent bouts of very watery stool   Relevant Orders   Gastrointestinal Pathogen Panel PCR   Pelvic pressure in female       encouraged patient to go now across the hallway to her surgeon's office and let them know about her symptoms; will check urine to r/o UTI   Relevant Orders   Urine Culture (Completed)   Urinalysis, microscopic only (Completed)       Follow up plan: No follow-ups on file.  An after-visit summary was printed and given to the patient at [facility].  Please see the patient instructions which may contain other information and recommendations beyond what is mentioned above in the assessment and plan.  Meds ordered this encounter  Medications   metoprolol succinate (TOPROL-XL) 25 MG 24 hr tablet    Sig: Take 1 tablet (25 mg total) by mouth daily.    Dispense:  30 tablet    Refill:  5    Orders Placed This Encounter  Procedures   Urine Culture   Gastrointestinal Pathogen Panel PCR   Urinalysis, microscopic only   Urinalysis   TEST AUTHORIZATION

## 2018-10-02 ENCOUNTER — Other Ambulatory Visit: Payer: Self-pay

## 2018-10-02 ENCOUNTER — Encounter: Payer: Self-pay | Admitting: Surgery

## 2018-10-02 ENCOUNTER — Telehealth: Payer: Self-pay

## 2018-10-02 DIAGNOSIS — R102 Pelvic and perineal pain: Secondary | ICD-10-CM

## 2018-10-02 LAB — URINALYSIS
BILIRUBIN URINE: NEGATIVE
Glucose, UA: NEGATIVE
Hgb urine dipstick: NEGATIVE
Ketones, ur: NEGATIVE
Leukocytes,Ua: NEGATIVE
Nitrite: NEGATIVE
Protein, ur: NEGATIVE
Specific Gravity, Urine: 1.01 (ref 1.001–1.03)
pH: 5.5 (ref 5.0–8.0)

## 2018-10-02 LAB — TEST AUTHORIZATION

## 2018-10-02 LAB — URINE CULTURE
MICRO NUMBER:: 306797
SPECIMEN QUALITY:: ADEQUATE

## 2018-10-02 LAB — URINALYSIS, MICROSCOPIC ONLY
Bacteria, UA: NONE SEEN /HPF
Hyaline Cast: NONE SEEN /LPF
RBC / HPF: NONE SEEN /HPF (ref 0–2)
Squamous Epithelial / HPF: NONE SEEN /HPF (ref <=5)
WBC, UA: NONE SEEN /HPF (ref 0–5)

## 2018-10-02 NOTE — Progress Notes (Signed)
Outpatient Surgical Follow Up  10/02/2018  Whitney Cooper is an 58 y.o. female.   Chief Complaint  Patient presents with  . Follow-up     patient said when she uses the bathroom feels pressure, slight pain    HPI: 58 year old female s/p sigmois colectomy and loops colostomy for anastomotic leak. Doing well overall.  Questioning about a possible fistula and pneumaturia it is extremely unclear if this is the case.  I doubt that she has a fistula from the new anastomosis to the bladder.  She does have an upcoming barium enema and at that time we might be able to determine definitively.  Currently she is not toxic and she is otherwise doing well.  She has done some Imodium to control the output of the ileostomy and currently she seems to be happy with it.  No  Clinical evidence of dehydration or deterioration  Past Medical History:  Diagnosis Date  . Beat, premature ventricular 06/21/2015  . Bundle branch block    palpitations a few weeks ago  . Diverticulitis of colon 07/07/2009  . Dry eyes   . Essential (primary) hypertension 06/21/2015  . Fibromyalgia   . GERD (gastroesophageal reflux disease)   . Headache    migraine  . History of chicken pox   . History of kidney stones   . Hyperlipidemia   . Hypertension   . IFG (impaired fasting glucose) 01/24/2015  . OAB (overactive bladder)   . Osteoarthritis   . Osteopenia determined by x-ray 02/10/2016  . Recurrent UTI   . Urinary incontinence   . Vertigo     Past Surgical History:  Procedure Laterality Date  . COLONOSCOPY WITH PROPOFOL N/A 11/11/2015   Procedure: COLONOSCOPY WITH PROPOFOL;  Surgeon: Lollie Sails, MD;  Location: [facility] ENDOSCOPY;  Service: Endoscopy;  Laterality: N/A;  . COLONOSCOPY WITH PROPOFOL N/A 06/24/2018   Procedure: COLONOSCOPY WITH PROPOFOL;  Surgeon: Lollie Sails, MD;  Location: [facility] ENDOSCOPY;  Service: Endoscopy;  Laterality: N/A;  . ESOPHAGOGASTRODUODENOSCOPY  11/13/2013  . ILEOSTOMY N/A 08/03/2018    Procedure: ILEOSTOMY;  Surgeon: Jules Husbands, MD;  Location: [facility] ORS;  Service: General;  Laterality: N/A;  . LAPAROSCOPIC RIGHT COLECTOMY N/A 07/29/2018   Procedure: LAPAROSCOPIC SIGMOID COLECTOMY;  Surgeon: Jules Husbands, MD;  Location: [facility] ORS;  Service: General;  Laterality: N/A;  . LAPAROTOMY N/A 08/03/2018   Procedure: EXPLORATORY LAPAROTOMY;  Surgeon: Jules Husbands, MD;  Location: [facility] ORS;  Service: General;  Laterality: N/A;  . OOPHORECTOMY    . TOTAL ABDOMINAL HYSTERECTOMY  Jan 2012   Complete, due to heavy bleeding and grape-fruit size fibroid     Family History  Problem Relation Age of Onset  . Hypertension Mother   . Cancer Mother        skin  . Heart disease Father        CHF  . Hypertension Father   . Stroke Father        mini strokes  . Cancer Father        skin  . Heart attack Father   . Thyroid disease Father   . Ovarian cancer Maternal Grandmother 66  . Cancer Maternal Grandmother        ovarian/brain  . Heart attack Maternal Grandfather   . Heart attack Paternal Grandfather   . Diabetes Maternal Uncle   . Thyroid disease Sister   . Breast cancer Sister   . Kidney disease Paternal Grandmother     Social History:  reports that she has never smoked. She has never used smokeless tobacco. She reports that she does not drink alcohol or use drugs.  Allergies:  Allergies  Allergen Reactions  . Baclofen Other (See Comments)    Dizziness  . Codeine Other (See Comments)    jittery  . Hydrocodone Other (See Comments)    Headache,dizziness  . Hydroxyzine     Made her very nervous  . Sulfa Antibiotics Rash    Medications reviewed.    ROS Full ROS performed and is otherwise negative other than what is stated in HPI   BP 126/81   Pulse 99   Temp (!) 97.5 F (36.4 C) (Temporal)   Ht 5\' 4"  (1.626 m)   Wt 121 lb 12.8 oz (55.2 kg)   SpO2 99%   BMI 20.91 kg/m   Physical Exam Vitals signs and nursing note reviewed. Exam conducted with a  chaperone present.  Constitutional:      Appearance: Normal appearance. She is normal weight.  Pulmonary:     Effort: Pulmonary effort is normal. No respiratory distress.     Breath sounds: No stridor.  Abdominal:     General: Abdomen is flat. There is no distension.     Palpations: There is no mass.     Tenderness: There is no abdominal tenderness. There is no guarding or rebound.     Hernia: No hernia is present.     Comments: Ileostomy pink and patent, working well. Thick content. No peritonitis  Skin:    Capillary Refill: Capillary refill takes less than 2 seconds.  Neurological:     General: No focal deficit present.     Mental Status: She is alert and oriented to person, place, and time.  Psychiatric:        Mood and Affect: Mood normal.        Behavior: Behavior normal.        Thought Content: Thought content normal.        Judgment: Judgment normal.        Results for orders placed or performed in visit on 10/01/18 (from the past 48 hour(s))  Urinalysis, microscopic only     Status: None   Collection Time: 10/01/18 11:39 AM  Result Value Ref Range   WBC, UA NONE SEEN 0 - 5 /HPF   RBC / HPF NONE SEEN 0 - 2 /HPF   Squamous Epithelial / LPF NONE SEEN < OR = 5 /HPF   Bacteria, UA NONE SEEN NONE SEEN /HPF   Hyaline Cast NONE SEEN NONE SEEN /LPF   No results found.  Assessment/Plan:  Doing well. No surgical issues at this time.  We will see her back after she completes her barium enema, at that time we can schedule the loop ileostomy takedown Greater than 50% of the *15 minutes  visit was spent in counseling/coordination of care   Caroleen Hamman, MD [place] Surgeon

## 2018-10-02 NOTE — Telephone Encounter (Signed)
-----   Message from Arnetha Courser, MD sent at 10/02/2018  7:09 AM EDT ----- Joelene Millin, please let the patient know that her urine microscopic was normal. Please ADD ON a urine dip to the urine collected yesterday and sent to [facility]. It doesn't look like the POCT urine dip was done, so please have them check that instead and cancel the POCT order.

## 2018-10-02 NOTE — Progress Notes (Signed)
Whitney Cooper, please let the patient know that her urine microscopic was normal. Please ADD ON a urine dip to the urine collected yesterday and sent to [facility]. It doesn't look like the POCT urine dip was done, so please have them check that instead and cancel the POCT order.

## 2018-10-03 NOTE — Progress Notes (Signed)
Whitney Cooper, please let the patient know that her urine overall did not who anything worrisome; no protein, no blood, no overwhelming infection; if she continues to have symptoms that aren't explained by her recent surgery, please let us know

## 2018-10-05 NOTE — Assessment & Plan Note (Signed)
Controlled here today; continue med

## 2018-10-08 ENCOUNTER — Telehealth: Payer: Self-pay | Admitting: *Deleted

## 2018-10-08 ENCOUNTER — Ambulatory Visit
Admission: RE | Admit: 2018-10-08 | Discharge: 2018-10-08 | Disposition: A | Discharge status: Home / Self Care | Payer: 59 | Source: Ambulatory Visit | Attending: Surgery | Admitting: Surgery

## 2018-10-08 ENCOUNTER — Other Ambulatory Visit: Payer: Self-pay

## 2018-10-08 DIAGNOSIS — K5790 Diverticulosis of intestine, part unspecified, without perforation or abscess without bleeding: Secondary | ICD-10-CM | POA: Diagnosis not present

## 2018-10-08 DIAGNOSIS — K5732 Diverticulitis of large intestine without perforation or abscess without bleeding: Secondary | ICD-10-CM | POA: Insufficient documentation

## 2018-10-08 NOTE — Telephone Encounter (Signed)
-----   Message from Jules Husbands, MD sent at 10/08/2018 12:33 PM EDT ----- Please let her know that her contrast study was normal. No fistula and no leak ----- Message ----- From: Interface, Rad Results In Sent: 10/08/2018  11:05 AM EDT To: Jules Husbands, MD

## 2018-10-08 NOTE — Telephone Encounter (Signed)
Notified patient as instructed, patient pleased. Discussed follow-up appointments, patient agrees  

## 2018-10-09 DIAGNOSIS — Z932 Ileostomy status: Secondary | ICD-10-CM | POA: Diagnosis not present

## 2018-10-20 ENCOUNTER — Ambulatory Visit (INDEPENDENT_AMBULATORY_CARE_PROVIDER_SITE_OTHER): Payer: 59 | Admitting: Surgery

## 2018-10-20 ENCOUNTER — Other Ambulatory Visit: Payer: Self-pay

## 2018-10-20 ENCOUNTER — Encounter: Payer: Self-pay | Admitting: Surgery

## 2018-10-20 DIAGNOSIS — Z932 Ileostomy status: Secondary | ICD-10-CM

## 2018-10-20 NOTE — Progress Notes (Signed)
Telemedicine Surgical Follow Up  10/20/2018  Metztli Sachdev Lebo is an 58 y.o. female.   No chief complaint on file.   HPI:  Location of the patient:Home Time spent on call: 17 min Total Time spent in the encounter including counseling and coordination of care: 25 Location of the Provider: Office The patient had given consent for a telemedicine visit and they understands the limitations associated with this including but not limited to privacy, cyber security and technology issues. Persons participating in the visit: Patient  Eddie Dibbles is a 58 year old female well-known to me status post sigmoid colectomy with a diverting loop ileostomy.  She is doing well.  She is tolerating food and having good ostomy output.  She changes her ileostomy bag twice a day.  No fevers no chills no runny nose.  Denies any cough.  She is maintaining her weight.  She has been active.  She recently had barium enema that I have personally reviewed there is no evidence of any leak or strictures at this time.    Past Medical History:  Diagnosis Date  . Beat, premature ventricular 06/21/2015  . Bundle branch block    palpitations a few weeks ago  . Diverticulitis of colon 07/07/2009  . Dry eyes   . Essential (primary) hypertension 06/21/2015  . Fibromyalgia   . GERD (gastroesophageal reflux disease)   . Headache    migraine  . History of chicken pox   . History of kidney stones   . Hyperlipidemia   . Hypertension   . IFG (impaired fasting glucose) 01/24/2015  . OAB (overactive bladder)   . Osteoarthritis   . Osteopenia determined by x-ray 02/10/2016  . Recurrent UTI   . Urinary incontinence   . Vertigo     Past Surgical History:  Procedure Laterality Date  . COLONOSCOPY WITH PROPOFOL N/A 11/11/2015   Procedure: COLONOSCOPY WITH PROPOFOL;  Surgeon: Lollie Sails, MD;  Location: [facility] ENDOSCOPY;  Service: Endoscopy;  Laterality: N/A;  . COLONOSCOPY WITH PROPOFOL N/A 06/24/2018   Procedure: COLONOSCOPY WITH  PROPOFOL;  Surgeon: Lollie Sails, MD;  Location: [facility] ENDOSCOPY;  Service: Endoscopy;  Laterality: N/A;  . ESOPHAGOGASTRODUODENOSCOPY  11/13/2013  . ILEOSTOMY N/A 08/03/2018   Procedure: ILEOSTOMY;  Surgeon: Jules Husbands, MD;  Location: [facility] ORS;  Service: General;  Laterality: N/A;  . LAPAROSCOPIC RIGHT COLECTOMY N/A 07/29/2018   Procedure: LAPAROSCOPIC SIGMOID COLECTOMY;  Surgeon: Jules Husbands, MD;  Location: [facility] ORS;  Service: General;  Laterality: N/A;  . LAPAROTOMY N/A 08/03/2018   Procedure: EXPLORATORY LAPAROTOMY;  Surgeon: Jules Husbands, MD;  Location: [facility] ORS;  Service: General;  Laterality: N/A;  . OOPHORECTOMY    . TOTAL ABDOMINAL HYSTERECTOMY  Jan 2012   Complete, due to heavy bleeding and grape-fruit size fibroid     Family History  Problem Relation Age of Onset  . Hypertension Mother   . Cancer Mother        skin  . Heart disease Father        CHF  . Hypertension Father   . Stroke Father        mini strokes  . Cancer Father        skin  . Heart attack Father   . Thyroid disease Father   . Ovarian cancer Maternal Grandmother 66  . Cancer Maternal Grandmother        ovarian/brain  . Heart attack Maternal Grandfather   . Heart attack Paternal Grandfather   . Diabetes Maternal Uncle   .  Thyroid disease Sister   . Breast cancer Sister   . Kidney disease Paternal Grandmother     Social History:  reports that she has never smoked. She has never used smokeless tobacco. She reports that she does not drink alcohol or use drugs.  Allergies:  Allergies  Allergen Reactions  . Baclofen Other (See Comments)    Dizziness  . Codeine Other (See Comments)    jittery  . Hydrocodone Other (See Comments)    Headache,dizziness  . Hydroxyzine     Made her very nervous  . Sulfa Antibiotics Rash    Medications reviewed.    ROS Full ROS performed and is otherwise negative other than what is stated in HPI   Assessment/Plan: 58 year old female with loop  ileostomy in need for ileostomy reversal.  Given the COVID-19 epidemic I recommend waiting at least 6 weeks before we attempt ileostomy reversal.  I have discussed with the patient in detail that she needs to continue with good nutritional support and ileostomy care.  I will have another telemedicine visit in about 4 weeks and at that time we will have a better idea about the situation regarding the COVID-19 and potential elective cases.  At this time doing well and there is no need for emergent surgical intervention. The pt was provided an opportunity to ask questions and all were answered. The pt was advised to call back or seek in-person evaluation if the symptoms worsen or if the condition fails to improve as anticipated. Greater than 50% of the 25 minutes  visit was spent in counseling/coordination of care   Caroleen Hamman, MD [place] Surgeon

## 2018-10-22 ENCOUNTER — Ambulatory Visit: Payer: Self-pay | Admitting: *Deleted

## 2018-10-22 NOTE — Telephone Encounter (Signed)
Pt reports has been taking OTC Claritin and been been feeling "Jittery." Questioning if that could be coming from the medication and is there an alternative she could take. States did not take today and felt "A little better." Also states she had planned on having ileostomy  reversed soon but was told it would be another 4 weeks or so. No other symptoms, takes claritin for seasonal allergies. Also wanted Dr. Sanda Klein to be aware she may need refill of VAlium in future. Has 10 tabs left; states only takes 1/2 tab when needed. States just wanted Dr. Sanda Klein to know she may need it as ileostomy surgery delayed. Please advise:  Reason for Disposition . Caller has NON-URGENT medication question about med that PCP prescribed and triager unable to answer question    OTC med  Answer Assessment - Initial Assessment Questions 1. SYMPTOMS: "Do you have any symptoms?"     "Jittery" 2. SEVERITY: If symptoms are present, ask "Are they mild, moderate or severe?"     mild  Protocols used: MEDICATION QUESTION CALL-A-AH

## 2018-10-23 NOTE — Telephone Encounter (Signed)
Please see my note from 12:31 pm today

## 2018-10-23 NOTE — Telephone Encounter (Signed)
Please have her STOP the claritin Ask if she has been taking plain claritin or claritin-D (that has the decongestant and can make people feel wound up and jittery Don't use any decongestants Try to use PLAIN allergy medicine without the decongestant Avoid: phenylephrine, phenylpropanolamine, and pseudoephredine  She can try plain allegra or plain zyrtec; if jitteriness persists, please call back (can be other things like thyroid problem, for example)  I note that she'll need valium in the future; okay to call when needed  [facility] reversal goes well

## 2018-10-23 NOTE — Telephone Encounter (Signed)
Pt.notified

## 2018-11-04 ENCOUNTER — Telehealth: Payer: Self-pay | Admitting: Family Medicine

## 2018-11-04 ENCOUNTER — Other Ambulatory Visit: Payer: Self-pay

## 2018-11-04 ENCOUNTER — Ambulatory Visit (INDEPENDENT_AMBULATORY_CARE_PROVIDER_SITE_OTHER): Payer: 59 | Admitting: Family Medicine

## 2018-11-04 ENCOUNTER — Encounter: Payer: Self-pay | Admitting: Family Medicine

## 2018-11-04 VITALS — BP 127/87 | HR 96 | Wt 116.0 lb

## 2018-11-04 DIAGNOSIS — R450 Nervousness: Secondary | ICD-10-CM

## 2018-11-04 DIAGNOSIS — R195 Other fecal abnormalities: Secondary | ICD-10-CM

## 2018-11-04 DIAGNOSIS — R7989 Other specified abnormal findings of blood chemistry: Secondary | ICD-10-CM

## 2018-11-04 DIAGNOSIS — H04123 Dry eye syndrome of bilateral lacrimal glands: Secondary | ICD-10-CM

## 2018-11-04 DIAGNOSIS — R21 Rash and other nonspecific skin eruption: Secondary | ICD-10-CM

## 2018-11-04 DIAGNOSIS — R634 Abnormal weight loss: Secondary | ICD-10-CM

## 2018-11-04 NOTE — Progress Notes (Signed)
BP 127/87    Pulse 96    Wt 116 lb (52.6 kg)    BMI 19.91 kg/m    Subjective:    Patient ID: Whitney Cooper, female    DOB: 11-28-1960, 58 y.o.   MRN: [id number]  HPI: Whitney Cooper is a 58 y.o. female  Chief Complaint  Patient presents with   Allergies    she has tried claritin and Human resources officer both making her jittery.  Sore throat and dainage   Anxiety    she feel like she is having panic attack    HPI   Virtual Visit via Telephone/Video Note   I connected with the patient via: Doximity dialer, VIDEO  I verified that I am speaking with the correct person using two identifiers.  Call started: 2:49 pm Call terminated: 3:24 pm Total length of call: 35 minutes   I discussed the limitations, risks, and privacy concerns of performing an evaluation and management service by telephone and the availability of in-person appointments. I explained that he/she may be responsible for charges related to this service. The patient expressed understanding and agreed to proceed.  Provider location: home, upstairs office with door closed, earphones/headset on Patient location: home, back bedroom Additional participants: husband is in the den on his computer, son is going to work, he is upstairs  She has not been anywhere for 3 weeks, just in the car once; no exposures to COVID-19 to her knowledge  She has had a really really dry throat and mouth for several weeks When she got out of the hospital, she had antibiotics Keeping water by the bed Really dry mucous membranes She got some biotin a few weeks ago Sore throat is not going away; salt water gargles help throat but make her tongue worse Lots of drainage Started on claritin about a month ago Was feeling really jittery  Nurse from [facility] called her to check in (a few times a month) She switched from claritin to allegra about a week or week and a half ago; NOT the decongestants; little stuffy in the nose;  No fevers Dry eyes too She has  not had these same symptoms in other spring seasons Eyes feel tired too She has felt just exhausted She has been losing weight; maybe a pound a week No family hx of autoimmune disorder, then remembers that her sister and father both have thyroid disease She was pretty healthy until she got the diverticulitis She had a hysterectomy when she was probably in her 50s Flu shot this past season I asked about rash; she has had a rash on stomach and back; using cortisone cream Wondered if it was reaction to adhesive, wearing the colostomy bag; the rash is gone Wondered if from the imodium Rash was on torso but nothing on the face Feeling jittery Stools are overly watery; just depends on what she eats over 2-3 days, alternating [place] and chicken, beans, mac and cheese, not a big variety; not much fiber Back hurts all the time; she says that there are times when she stands up and it doesn't bother her very much; sometimes it feels like it's going to break in two; she walks in the backyard; secluded; Dr. Dahlia Byes was encouraging her to walk; she had spasms in her neck; she is sleeping in another room; computer is at kitchen table, not a good set up, has a neck brace to try to keep her neck; feels like she is humped over like an old woman; just  exhausted; nervous about this virus stuff; nervous about not getting back in the hospital to get this colostomy redone Was going to have shots in her back, was going to spine and scoliosis place in [place] and she got another bout of diverticulitis and had to have the shots postponed, maybe September or October; she has DDD used to get massages once a month, but cannot do that (not since December); she has been to the chiropractor; saw him last 2 months ago, he knows about her back, he tried putting something under the lower back; hard to lay flat on the back; she has had imaging of her back; she has seen the doctor, Dr. Maia Petties in [place] Reviewed her C spine May 2019  from Dr. Maia Petties She has always been one to worry about things, but not to the point of making herself sick; she has been anxious; was anxious about work a few years ago She does not even watch the news any more; one person says everything is going great and getting better, then she'll see something else, does not even watch it any more She has 5 or 6 valium still from February; using just one a week  Depression screen [facility] 2/9 11/04/2018 10/01/2018 09/02/2018 06/26/2018 02/13/2018  Decreased Interest 0 0 1 1 0  Down, Depressed, Hopeless 0 0 1 0 0  PHQ - 2 Score 0 0 2 1 0  Altered sleeping 3 0 1 1 0  Tired, decreased energy 3 0 3 2 0  Change in appetite 0 0 0 1 0  Feeling bad or failure about yourself  1 0 0 1 0  Trouble concentrating 0 0 0 0 0  Moving slowly or fidgety/restless 0 0 0 0 0  Suicidal thoughts 0 0 0 0 0  PHQ-9 Score 7 0 6 6 0  Difficult doing work/chores Somewhat difficult Not difficult at all Not difficult at all Somewhat difficult Not difficult at all   Fall Risk  11/04/2018 10/01/2018 10/01/2018 09/02/2018 08/27/2018  Falls in the past year? 0 0 0 0 0  Number falls in past yr: 0 0 0 - -  Injury with Fall? 0 0 0 - -    Relevant past medical, surgical, family and social history reviewed Past Medical History:  Diagnosis Date   Beat, premature ventricular 06/21/2015   Bundle branch block    palpitations a few weeks ago   Diverticulitis of colon 07/07/2009   Dry eyes    Essential (primary) hypertension 06/21/2015   Fibromyalgia    GERD (gastroesophageal reflux disease)    Headache    migraine   History of chicken pox    History of kidney stones    Hyperlipidemia    Hypertension    IFG (impaired fasting glucose) 01/24/2015   OAB (overactive bladder)    Osteoarthritis    Osteopenia determined by x-ray 02/10/2016   Recurrent UTI    Urinary incontinence    Vertigo    Past Surgical History:  Procedure Laterality Date   COLONOSCOPY WITH PROPOFOL N/A  11/11/2015   Procedure: COLONOSCOPY WITH PROPOFOL;  Surgeon: Lollie Sails, MD;  Location[facility] ENDOSCOPY;  Service: Endoscopy;  Laterality: N/A;   COLONOSCOPY WITH PROPOFOL N/A 06/24/2018   Procedure: COLONOSCOPY WITH PROPOFOL;  Surgeon: Lollie Sails, MD;  Location: [facility] ENDOSCOPY;  Service: Endoscopy;  Laterality: N/A;   ESOPHAGOGASTRODUODENOSCOPY  11/13/2013   ILEOSTOMY N/A 08/03/2018   Procedure: ILEOSTOMY;  Surgeon: Jules Husbands, MD;  Location: [facility] ORS;  Service: General;  Laterality: N/A;   LAPAROSCOPIC RIGHT COLECTOMY N/A 07/29/2018   Procedure: LAPAROSCOPIC SIGMOID COLECTOMY;  Surgeon: Jules Husbands, MD;  Location: [facility] ORS;  Service: General;  Laterality: N/A;   LAPAROTOMY N/A 08/03/2018   Procedure: EXPLORATORY LAPAROTOMY;  Surgeon: Jules Husbands, MD;  Location: [facility] ORS;  Service: General;  Laterality: N/A;   OOPHORECTOMY     TOTAL ABDOMINAL HYSTERECTOMY  Jan 2012   Complete, due to heavy bleeding and grape-fruit size fibroid    Family History  Problem Relation Age of Onset   Hypertension Mother    Cancer Mother        skin   Heart disease Father        CHF   Hypertension Father    Stroke Father        mini strokes   Cancer Father        skin   Heart attack Father    Thyroid disease Father    Ovarian cancer Maternal Grandmother 66   Cancer Maternal Grandmother        ovarian/brain   Heart attack Maternal Grandfather    Heart attack Paternal Grandfather    Diabetes Maternal Uncle    Thyroid disease Sister    Breast cancer Sister    Kidney disease Paternal Grandmother    Social History   Tobacco Use   Smoking status: Never Smoker   Smokeless tobacco: Never Used  Substance Use Topics   Alcohol use: No   Drug use: Never     Office Visit from 11/04/2018 in [facility]  AUDIT-C Score  0      Interim medical history since last visit reviewed. Allergies and medications reviewed  Review of Systems Per  HPI unless specifically indicated above     Objective:    BP 127/87    Pulse 96    Wt 116 lb (52.6 kg)    BMI 19.91 kg/m   Wt Readings from Last 3 Encounters:  11/04/18 116 lb (52.6 kg)  10/01/18 121 lb 12.8 oz (55.2 kg)  10/01/18 122 lb 4.8 oz (55.5 kg)    Physical Exam Constitutional:      Appearance: Normal appearance.  Eyes:     Extraocular Movements: Extraocular movements intact.     Right eye: Normal extraocular motion.     Left eye: Normal extraocular motion.  Pulmonary:     Effort: No tachypnea or respiratory distress.  Skin:    Coloration: Skin is not jaundiced or pale.  Neurological:     Mental Status: She is alert.     Cranial Nerves: No dysarthria.     Motor: No tremor.     Comments: Ambulatory, moving about during visit  Psychiatric:        Mood and Affect: Mood is not anxious or depressed.        Speech: Speech is not rapid and pressured, delayed or slurred.     Results for orders placed or performed in visit on 10/01/18  Urine Culture  Result Value Ref Range   MICRO NUMBER: [id number]    SPECIMEN QUALITY: Adequate    Sample Source URINE, CLEAN CATCH    STATUS: FINAL    Result:      Single organism less than 10,000 CFU/mL isolated. These organisms, commonly found on external and internal genitalia, are considered colonizers. No further testing performed.  Urinalysis, microscopic only  Result Value Ref Range   WBC, UA NONE SEEN 0 - 5 /HPF  RBC / HPF NONE SEEN 0 - 2 /HPF   Squamous Epithelial / LPF NONE SEEN < OR = 5 /HPF   Bacteria, UA NONE SEEN NONE SEEN /HPF   Hyaline Cast NONE SEEN NONE SEEN /LPF  Urinalysis  Result Value Ref Range   Color, Urine YELLOW YELLOW   APPearance CLEAR CLEAR   Specific Gravity, Urine 1.010 1.001 - 1.03   pH 5.5 5.0 - 8.0   Glucose, UA NEGATIVE NEGATIVE   Bilirubin Urine NEGATIVE NEGATIVE   Ketones, ur NEGATIVE NEGATIVE   Hgb urine dipstick NEGATIVE NEGATIVE   Protein, ur NEGATIVE NEGATIVE   Nitrite NEGATIVE  NEGATIVE   Leukocytes,Ua NEGATIVE NEGATIVE  TEST AUTHORIZATION  Result Value Ref Range   TEST NAME: URINALYSIS MACROSCOPIC    TEST CODE: [id number]    CLIENT CONTACT: CYNTHIA SIMPSON    REPORT ALWAYS MESSAGE SIGNATURE        Assessment & Plan:   Problem List Items Addressed This Visit    None    Visit Diagnoses    Jittery feeling    -  Primary   Relevant Orders   5 HIAA, quantitative, Urine, 24 hour   T4, free   TSH   T3, free   Abnormal thyroid blood test       Relevant Orders   T4, free   TSH   T3, free   Loose stools       Relevant Orders   CBC   COMPLETE METABOLIC PANEL WITH GFR   Weight loss       Relevant Orders   ANA,IFA RA Diag Pnl w/rflx Tit/Patn   Rash       Relevant Orders   ANA,IFA RA Diag Pnl w/rflx Tit/Patn   Dry eye syndrome of both eyes       Relevant Orders   ANA,IFA RA Diag Pnl w/rflx Tit/Patn   Sjogrens syndrome-A extractable nuclear antibody   Sjogrens syndrome-B extractable nuclear antibody   C-reactive protein     Discussed lengthy ddx with her Will get labs and then refer to either endo or rheumatologist based on findings, putting everything together Let me know of any changes Let Dr. Maia Petties of worsening back/neck issues Valium PRN, no red flags, one a week; okay to refill in interim if needed  Follow up plan: No follow-ups on file.   No orders of the defined types were placed in this encounter.   Orders Placed This Encounter  Procedures   ANA,IFA RA Diag Pnl w/rflx Tit/Patn   5 HIAA, quantitative, Urine, 24 hour   Sjogrens syndrome-A extractable nuclear antibody   Sjogrens syndrome-B extractable nuclear antibody   C-reactive protein   CBC   COMPLETE METABOLIC PANEL WITH GFR   T4, free   TSH   T3, free   She will find out if she can have it done at [facility]; she'll let us know if orders need to be switched to [place]; she will check with [facility] to see which lab is desired

## 2018-11-04 NOTE — Telephone Encounter (Signed)
Patient left a message to have the providers office call her back to discuss her allergy medications again. She says she has more questions in regards to medications. Call back [phone] (home)

## 2018-11-04 NOTE — Telephone Encounter (Signed)
Pt states she has a lot of drainage and a alittle sore throat.  Fraser Din also talking about anxiety.  I told her we would just put her on schedule for telephone visit to discuss all her issues with her.

## 2018-11-05 ENCOUNTER — Other Ambulatory Visit: Payer: Self-pay

## 2018-11-05 DIAGNOSIS — R450 Nervousness: Secondary | ICD-10-CM

## 2018-11-05 DIAGNOSIS — R195 Other fecal abnormalities: Secondary | ICD-10-CM

## 2018-11-05 DIAGNOSIS — R7989 Other specified abnormal findings of blood chemistry: Secondary | ICD-10-CM

## 2018-11-05 DIAGNOSIS — H04123 Dry eye syndrome of bilateral lacrimal glands: Secondary | ICD-10-CM

## 2018-11-05 DIAGNOSIS — R21 Rash and other nonspecific skin eruption: Secondary | ICD-10-CM

## 2018-11-05 DIAGNOSIS — R634 Abnormal weight loss: Secondary | ICD-10-CM

## 2018-11-05 NOTE — Addendum Note (Signed)
Addended by: Docia Furl on: 11/05/2018 12:13 PM   Modules accepted: Orders

## 2018-11-05 NOTE — Addendum Note (Signed)
Addended by: Cathrine Muster C on: 11/05/2018 12:12 PM   Modules accepted: Orders

## 2018-11-07 DIAGNOSIS — Z932 Ileostomy status: Secondary | ICD-10-CM | POA: Diagnosis not present

## 2018-11-12 ENCOUNTER — Other Ambulatory Visit: Payer: Self-pay | Admitting: Family Medicine

## 2018-11-12 MED ORDER — DIAZEPAM 2 MG PO TABS: 1.0000 mg | ORAL_TABLET | Freq: Three times a day (TID) | ORAL | 0 refills | Status: DC | PRN

## 2018-11-12 NOTE — Telephone Encounter (Signed)
Reviewed PMP aware site No red flags Rx sent

## 2018-11-17 ENCOUNTER — Telehealth (INDEPENDENT_AMBULATORY_CARE_PROVIDER_SITE_OTHER): Payer: 59 | Admitting: Surgery

## 2018-11-17 ENCOUNTER — Other Ambulatory Visit: Payer: Self-pay

## 2018-11-17 DIAGNOSIS — Z932 Ileostomy status: Secondary | ICD-10-CM

## 2018-11-17 NOTE — Progress Notes (Signed)
Telemedicine Surgical Follow Up  11/17/2018  Whitney Cooper is an 58 y.o. female.   No chief complaint on file.   HPI:  Location of the patient:Home Time spent on call: 31min Total Time spent in the encounter including counseling and coordination of care: 14 Location of the Provider:Office The patient had given consent for a telemedicine visit and they understands the limitations associated with this including but not limited to privacy, cyber security and technology issues. Persons participating in the visit: pt Phone call with the patient. She reports that she is doing overall well.  She complains of some dry mouth and dry eyes.  Dr. Marzetta Board has started Sjorgen work up. She is not gaining nor loosing weight. Taking PO, ostomy working well Good seal on appliance.   Past Medical History:  Diagnosis Date  . Beat, premature ventricular 06/21/2015  . Bundle branch block    palpitations a few weeks ago  . Diverticulitis of colon 07/07/2009  . Dry eyes   . Essential (primary) hypertension 06/21/2015  . Fibromyalgia   . GERD (gastroesophageal reflux disease)   . Headache    migraine  . History of chicken pox   . History of kidney stones   . Hyperlipidemia   . Hypertension   . IFG (impaired fasting glucose) 01/24/2015  . OAB (overactive bladder)   . Osteoarthritis   . Osteopenia determined by x-ray 02/10/2016  . Recurrent UTI   . Urinary incontinence   . Vertigo     Past Surgical History:  Procedure Laterality Date  . COLONOSCOPY WITH PROPOFOL N/A 11/11/2015   Procedure: COLONOSCOPY WITH PROPOFOL;  Surgeon: Lollie Sails, MD;  Location: [facility] ENDOSCOPY;  Service: Endoscopy;  Laterality: N/A;  . COLONOSCOPY WITH PROPOFOL N/A 06/24/2018   Procedure: COLONOSCOPY WITH PROPOFOL;  Surgeon: Lollie Sails, MD;  Location: [facility] ENDOSCOPY;  Service: Endoscopy;  Laterality: N/A;  . ESOPHAGOGASTRODUODENOSCOPY  11/13/2013  . ILEOSTOMY N/A 08/03/2018   Procedure: ILEOSTOMY;  Surgeon:  Jules Husbands, MD;  Location: [facility] ORS;  Service: General;  Laterality: N/A;  . LAPAROSCOPIC RIGHT COLECTOMY N/A 07/29/2018   Procedure: LAPAROSCOPIC SIGMOID COLECTOMY;  Surgeon: Jules Husbands, MD;  Location: [facility] ORS;  Service: General;  Laterality: N/A;  . LAPAROTOMY N/A 08/03/2018   Procedure: EXPLORATORY LAPAROTOMY;  Surgeon: Jules Husbands, MD;  Location: [facility] ORS;  Service: General;  Laterality: N/A;  . OOPHORECTOMY    . TOTAL ABDOMINAL HYSTERECTOMY  Jan 2012   Complete, due to heavy bleeding and grape-fruit size fibroid     Family History  Problem Relation Age of Onset  . Hypertension Mother   . Cancer Mother        skin  . Heart disease Father        CHF  . Hypertension Father   . Stroke Father        mini strokes  . Cancer Father        skin  . Heart attack Father   . Thyroid disease Father   . Ovarian cancer Maternal Grandmother 66  . Cancer Maternal Grandmother        ovarian/brain  . Heart attack Maternal Grandfather   . Heart attack Paternal Grandfather   . Diabetes Maternal Uncle   . Thyroid disease Sister   . Breast cancer Sister   . Kidney disease Paternal Grandmother     Social History:  reports that she has never smoked. She has never used smokeless tobacco. She reports that she does not  drink alcohol or use drugs.  Allergies:  Allergies  Allergen Reactions  . Baclofen Other (See Comments)    Dizziness  . Codeine Other (See Comments)    jittery  . Hydrocodone Other (See Comments)    Headache,dizziness  . Hydroxyzine     Made her very nervous  . Sulfa Antibiotics Rash    Medications reviewed.    ROS Full ROS performed and is otherwise negative other than what is stated in HPI   Assessment/Plan: I will see her in 3 weeks face to face. At that time we will have better idea about timing of ileostomy takedown. No surgical issues at  This time. The pt was provided an opportunity to ask questions and all were answered. The pt was advised to call  back or seek in-person evaluation if the symptoms worsen or if the condition fails to improve as anticipated. Greater than 50% of the 14 minutes  visit was spent in counseling/coordination of care   Caroleen Hamman, MD [place] Surgeon

## 2018-11-18 DIAGNOSIS — R21 Rash and other nonspecific skin eruption: Secondary | ICD-10-CM | POA: Diagnosis not present

## 2018-11-18 DIAGNOSIS — R195 Other fecal abnormalities: Secondary | ICD-10-CM | POA: Diagnosis not present

## 2018-11-18 DIAGNOSIS — R634 Abnormal weight loss: Secondary | ICD-10-CM | POA: Diagnosis not present

## 2018-11-18 DIAGNOSIS — R450 Nervousness: Secondary | ICD-10-CM | POA: Diagnosis not present

## 2018-11-18 LAB — CBC AND DIFFERENTIAL
HCT: 43 (ref 36–46)
Hemoglobin: 14.5 (ref 12.0–16.0)
Platelets: 303 (ref 150–399)
WBC: 6.9

## 2018-11-18 LAB — BASIC METABOLIC PANEL
BUN: 10 (ref 4–21)
Creatinine: 0.7 (ref 0.5–1.1)
Glucose: 101
Potassium: 4.8 (ref 3.4–5.3)
Sodium: 140 (ref 137–147)

## 2018-11-18 LAB — HEPATIC FUNCTION PANEL
ALT: 40 — AB (ref 7–35)
AST: 27 (ref 13–35)
Alkaline Phosphatase: 73 (ref 25–125)
Bilirubin, Total: 0.6

## 2018-11-18 LAB — TSH: TSH: 3.97 (ref 0.41–5.90)

## 2018-11-26 ENCOUNTER — Telehealth: Payer: Self-pay | Admitting: Nurse Practitioner

## 2018-11-26 NOTE — Telephone Encounter (Signed)
Patient was informed of urine results but asked about the results from the blood work.

## 2018-11-26 NOTE — Telephone Encounter (Signed)
I have not seen these yet, please call [facility] to send results

## 2018-11-26 NOTE — Telephone Encounter (Signed)
It's on it's way via fax.

## 2018-11-26 NOTE — Telephone Encounter (Signed)
Patient 5HIAA 24 hours urine results came back normal. Keep follow up with Dr. Sanda Klein.

## 2018-12-05 ENCOUNTER — Telehealth: Payer: Self-pay | Admitting: Family Medicine

## 2018-12-05 NOTE — Telephone Encounter (Signed)
Labs sent via fax - CMP is normal except for very mild elevation in ALT - just something to watch and monitor.  CBC normal, thyroid panel is normal, Sjogren's testing is normal, CRP normal.

## 2018-12-08 ENCOUNTER — Encounter: Payer: Self-pay | Admitting: Surgery

## 2018-12-08 ENCOUNTER — Other Ambulatory Visit: Payer: Self-pay

## 2018-12-08 ENCOUNTER — Encounter: Payer: Self-pay | Admitting: *Deleted

## 2018-12-08 ENCOUNTER — Ambulatory Visit (INDEPENDENT_AMBULATORY_CARE_PROVIDER_SITE_OTHER): Payer: 59 | Admitting: Surgery

## 2018-12-08 VITALS — BP 130/87 | HR 77 | Temp 97.7°F | Resp 16 | Ht 63.5 in | Wt 115.8 lb

## 2018-12-08 DIAGNOSIS — Z932 Ileostomy status: Secondary | ICD-10-CM | POA: Diagnosis not present

## 2018-12-08 NOTE — Progress Notes (Signed)
Outpatient Surgical Follow Up  12/08/2018  Yesli Vanderhoff Thornell is an 58 y.o. female.   Chief Complaint  Patient presents with  . Follow-up    HPI: 58 year old female status post sigmoid colectomy requiring loop ileostomy for a leak.  Given the COVID-19 epidemic she has been waiting for ileostomy reversal.  She is doing very well.  She is eating well she is walking, the ileostomy working without any issues.  She did have a barium enema showing patency of the anastomosis without evidence of leak   Past Medical History:  Diagnosis Date  . Beat, premature ventricular 06/21/2015  . Bundle branch block    palpitations a few weeks ago  . Diverticulitis of colon 07/07/2009  . Dry eyes   . Essential (primary) hypertension 06/21/2015  . Fibromyalgia   . GERD (gastroesophageal reflux disease)   . Headache    migraine  . History of chicken pox   . History of kidney stones   . Hyperlipidemia   . Hypertension   . IFG (impaired fasting glucose) 01/24/2015  . OAB (overactive bladder)   . Osteoarthritis   . Osteopenia determined by x-ray 02/10/2016  . Recurrent UTI   . Urinary incontinence   . Vertigo     Past Surgical History:  Procedure Laterality Date  . COLONOSCOPY WITH PROPOFOL N/A 11/11/2015   Procedure: COLONOSCOPY WITH PROPOFOL;  Surgeon: Lollie Sails, MD;  Location: [facility] ENDOSCOPY;  Service: Endoscopy;  Laterality: N/A;  . COLONOSCOPY WITH PROPOFOL N/A 06/24/2018   Procedure: COLONOSCOPY WITH PROPOFOL;  Surgeon: Lollie Sails, MD;  Location: [facility] ENDOSCOPY;  Service: Endoscopy;  Laterality: N/A;  . ESOPHAGOGASTRODUODENOSCOPY  11/13/2013  . ILEOSTOMY N/A 08/03/2018   Procedure: ILEOSTOMY;  Surgeon: Jules Husbands, MD;  Location: [facility] ORS;  Service: General;  Laterality: N/A;  . LAPAROSCOPIC RIGHT COLECTOMY N/A 07/29/2018   Procedure: LAPAROSCOPIC SIGMOID COLECTOMY;  Surgeon: Jules Husbands, MD;  Location: [facility] ORS;  Service: General;  Laterality: N/A;  . LAPAROTOMY N/A 08/03/2018    Procedure: EXPLORATORY LAPAROTOMY;  Surgeon: Jules Husbands, MD;  Location: [facility] ORS;  Service: General;  Laterality: N/A;  . OOPHORECTOMY    . TOTAL ABDOMINAL HYSTERECTOMY  Jan 2012   Complete, due to heavy bleeding and grape-fruit size fibroid     Family History  Problem Relation Age of Onset  . Hypertension Mother   . Cancer Mother        skin  . Heart disease Father        CHF  . Hypertension Father   . Stroke Father        mini strokes  . Cancer Father        skin  . Heart attack Father   . Thyroid disease Father   . Ovarian cancer Maternal Grandmother 66  . Cancer Maternal Grandmother        ovarian/brain  . Heart attack Maternal Grandfather   . Heart attack Paternal Grandfather   . Diabetes Maternal Uncle   . Thyroid disease Sister   . Breast cancer Sister   . Kidney disease Paternal Grandmother     Social History:  reports that she has never smoked. She has never used smokeless tobacco. She reports that she does not drink alcohol or use drugs.  Allergies:  Allergies  Allergen Reactions  . Baclofen Other (See Comments)    Dizziness  . Codeine Other (See Comments)    jittery  . Hydrocodone Other (See Comments)    Headache,dizziness  .  Hydroxyzine     Made her very nervous  . Sulfa Antibiotics Rash    Medications reviewed.    ROS Full ROS performed and is otherwise negative other than what is stated in HPI   BP 130/87   Pulse 77   Temp 97.7 F (36.5 C) (Temporal)   Resp 16   Ht 5' 3.5" (1.613 m)   Wt 115 lb 12.8 oz (52.5 kg)   SpO2 97%   BMI 20.19 kg/m   Physical Exam Vitals signs and nursing note reviewed.  Constitutional:      Appearance: Normal appearance. She is obese.  Eyes:     General: No scleral icterus.       Right eye: No discharge.        Left eye: No discharge.  Neck:     Musculoskeletal: Normal range of motion and neck supple.  Cardiovascular:     Rate and Rhythm: Normal rate and regular rhythm.     Pulses: Normal  pulses.     Heart sounds: Normal heart sounds. No murmur.  Pulmonary:     Effort: Pulmonary effort is normal.     Breath sounds: Normal breath sounds. No stridor. No rhonchi.  Abdominal:     General: There is no distension.     Palpations: There is no mass.     Tenderness: There is no abdominal tenderness. There is no guarding.     Hernia: No hernia is present.     Comments: Laparotomy scar healed, RLQ ileostomy pink and patent.  Musculoskeletal: Normal range of motion.        General: No swelling.  Skin:    General: Skin is warm and dry.     Capillary Refill: Capillary refill takes less than 2 seconds.  Neurological:     General: No focal deficit present.     Mental Status: She is alert and oriented to person, place, and time.  Psychiatric:        Mood and Affect: Mood normal.        Behavior: Behavior normal.        Thought Content: Thought content normal.        Judgment: Judgment normal.        Assessment/Plan:  36 -year-old female status post sigmoid colectomy and loop ileostomy in need for ileostomy reversal.  Discussed with the patient in detail the proposed surgery.  Risk, benefits and possible indications include but not limited to: Bleeding,, bowel injury, ileus and pain.  She understands and wishes to proceed.   Greater than 50% of the 25 minutes  visit was spent in counseling/coordination of care   Caroleen Hamman, MD [place] Surgeon

## 2018-12-08 NOTE — Telephone Encounter (Signed)
Pt.notified

## 2018-12-08 NOTE — Progress Notes (Signed)
Patient's surgery to be scheduled for 12-30-18 at [facility] with Dr. Dahlia Byes.  The patient is aware she will need to Pre-Admit. Patient will check in at the [facility] entrance and be escorted to the Pre-admission Testing Department due to COVID-19 restrictions. Patient will be contacted once Pre-admit appointment and COVID-19 testing has been arranged. Patient aware to isolate after COVID-19 testing, no visitors, frequent hand washing, and to keep hands off face.  Patient aware to be NPO after midnight.   She is aware to check in at the [facility] entrance where she will be screened for the coronavirus and then sent to Same Day Surgery.   Patient aware that she may have no visitors.  The patient verbalizes understanding of the above.   The patient is aware to call the office should she have further questions.

## 2018-12-08 NOTE — H&P (View-Only) (Signed)
Outpatient Surgical Follow Up  12/08/2018  Whitney Cooper is an 58 y.o. female.   Chief Complaint  Patient presents with  . Follow-up    HPI: 58 year old female status post sigmoid colectomy requiring loop ileostomy for a leak.  Given the COVID-19 epidemic she has been waiting for ileostomy reversal.  She is doing very well.  She is eating well she is walking, the ileostomy working without any issues.  She did have a barium enema showing patency of the anastomosis without evidence of leak   Past Medical History:  Diagnosis Date  . Beat, premature ventricular 06/21/2015  . Bundle branch block    palpitations a few weeks ago  . Diverticulitis of colon 07/07/2009  . Dry eyes   . Essential (primary) hypertension 06/21/2015  . Fibromyalgia   . GERD (gastroesophageal reflux disease)   . Headache    migraine  . History of chicken pox   . History of kidney stones   . Hyperlipidemia   . Hypertension   . IFG (impaired fasting glucose) 01/24/2015  . OAB (overactive bladder)   . Osteoarthritis   . Osteopenia determined by x-ray 02/10/2016  . Recurrent UTI   . Urinary incontinence   . Vertigo     Past Surgical History:  Procedure Laterality Date  . COLONOSCOPY WITH PROPOFOL N/A 11/11/2015   Procedure: COLONOSCOPY WITH PROPOFOL;  Surgeon: Lollie Sails, MD;  Location: [facility] ENDOSCOPY;  Service: Endoscopy;  Laterality: N/A;  . COLONOSCOPY WITH PROPOFOL N/A 06/24/2018   Procedure: COLONOSCOPY WITH PROPOFOL;  Surgeon: Lollie Sails, MD;  Location: [facility] ENDOSCOPY;  Service: Endoscopy;  Laterality: N/A;  . ESOPHAGOGASTRODUODENOSCOPY  11/13/2013  . ILEOSTOMY N/A 08/03/2018   Procedure: ILEOSTOMY;  Surgeon: Jules Husbands, MD;  Location: [facility] ORS;  Service: General;  Laterality: N/A;  . LAPAROSCOPIC RIGHT COLECTOMY N/A 07/29/2018   Procedure: LAPAROSCOPIC SIGMOID COLECTOMY;  Surgeon: Jules Husbands, MD;  Location: [facility] ORS;  Service: General;  Laterality: N/A;  . LAPAROTOMY N/A 08/03/2018    Procedure: EXPLORATORY LAPAROTOMY;  Surgeon: Jules Husbands, MD;  Location: [facility] ORS;  Service: General;  Laterality: N/A;  . OOPHORECTOMY    . TOTAL ABDOMINAL HYSTERECTOMY  Jan 2012   Complete, due to heavy bleeding and grape-fruit size fibroid     Family History  Problem Relation Age of Onset  . Hypertension Mother   . Cancer Mother        skin  . Heart disease Father        CHF  . Hypertension Father   . Stroke Father        mini strokes  . Cancer Father        skin  . Heart attack Father   . Thyroid disease Father   . Ovarian cancer Maternal Grandmother 66  . Cancer Maternal Grandmother        ovarian/brain  . Heart attack Maternal Grandfather   . Heart attack Paternal Grandfather   . Diabetes Maternal Uncle   . Thyroid disease Sister   . Breast cancer Sister   . Kidney disease Paternal Grandmother     Social History:  reports that she has never smoked. She has never used smokeless tobacco. She reports that she does not drink alcohol or use drugs.  Allergies:  Allergies  Allergen Reactions  . Baclofen Other (See Comments)    Dizziness  . Codeine Other (See Comments)    jittery  . Hydrocodone Other (See Comments)    Headache,dizziness  .  Hydroxyzine     Made her very nervous  . Sulfa Antibiotics Rash    Medications reviewed.    ROS Full ROS performed and is otherwise negative other than what is stated in HPI   BP 130/87   Pulse 77   Temp 97.7 F (36.5 C) (Temporal)   Resp 16   Ht 5' 3.5" (1.613 m)   Wt 115 lb 12.8 oz (52.5 kg)   SpO2 97%   BMI 20.19 kg/m   Physical Exam Vitals signs and nursing note reviewed.  Constitutional:      Appearance: Normal appearance. She is obese.  Eyes:     General: No scleral icterus.       Right eye: No discharge.        Left eye: No discharge.  Neck:     Musculoskeletal: Normal range of motion and neck supple.  Cardiovascular:     Rate and Rhythm: Normal rate and regular rhythm.     Pulses: Normal  pulses.     Heart sounds: Normal heart sounds. No murmur.  Pulmonary:     Effort: Pulmonary effort is normal.     Breath sounds: Normal breath sounds. No stridor. No rhonchi.  Abdominal:     General: There is no distension.     Palpations: There is no mass.     Tenderness: There is no abdominal tenderness. There is no guarding.     Hernia: No hernia is present.     Comments: Laparotomy scar healed, RLQ ileostomy pink and patent.  Musculoskeletal: Normal range of motion.        General: No swelling.  Skin:    General: Skin is warm and dry.     Capillary Refill: Capillary refill takes less than 2 seconds.  Neurological:     General: No focal deficit present.     Mental Status: She is alert and oriented to person, place, and time.  Psychiatric:        Mood and Affect: Mood normal.        Behavior: Behavior normal.        Thought Content: Thought content normal.        Judgment: Judgment normal.        Assessment/Plan:  67 -year-old female status post sigmoid colectomy and loop ileostomy in need for ileostomy reversal.  Discussed with the patient in detail the proposed surgery.  Risk, benefits and possible indications include but not limited to: Bleeding,, bowel injury, ileus and pain.  She understands and wishes to proceed.   Greater than 50% of the 25 minutes  visit was spent in counseling/coordination of care   Caroleen Hamman, MD [facility]

## 2018-12-09 ENCOUNTER — Telehealth: Payer: Self-pay | Admitting: *Deleted

## 2018-12-09 NOTE — Telephone Encounter (Signed)
Patient contacted today and surgery date for 12-30-18 with Dr. Dahlia Byes confirmed.   Patient aware to Pre-admit on 12-23-18 at 9 am. She is aware to go in through the [facility] and will be escorted to the [facility] building.   The patient will have COVID-19 testing on 12-26-18 between 10:30 am and 12:30 pm at the [facility]. Previously notified of instructions.   She is aware to call the office if she has further questions.

## 2018-12-23 ENCOUNTER — Encounter
Admission: RE | Admit: 2018-12-23 | Discharge: 2018-12-23 | Disposition: A | Discharge status: Home / Self Care | Payer: 59 | Source: Ambulatory Visit | Attending: Surgery | Admitting: Surgery

## 2018-12-23 ENCOUNTER — Other Ambulatory Visit: Payer: Self-pay

## 2018-12-23 DIAGNOSIS — Z1159 Encounter for screening for other viral diseases: Secondary | ICD-10-CM | POA: Diagnosis not present

## 2018-12-23 DIAGNOSIS — Z01812 Encounter for preprocedural laboratory examination: Secondary | ICD-10-CM | POA: Diagnosis present

## 2018-12-23 HISTORY — DX: Other intervertebral disc displacement, lumbar region: M51.26

## 2018-12-23 HISTORY — DX: Other cervical disc degeneration, unspecified cervical region: M50.30

## 2018-12-23 NOTE — Patient Instructions (Signed)
Your procedure is scheduled on: Tues 12/30/18 Report to Day Surgery. To find out your arrival time please call [phone] between 1PM - 3PM on Mon. 12/29/18.  Remember: Instructions that are not followed completely may result in serious medical risk,  up to and including death, or upon the discretion of your surgeon and anesthesiologist your  surgery may need to be rescheduled.     _X__ 1. Do not eat food after midnight the night before your procedure.                 No gum chewing or hard candies. You may drink clear liquids up to 2 hours                 before you are scheduled to arrive for your surgery- DO not drink clear                 liquids within 2 hours of the start of your surgery.                 Clear Liquids include:  water, apple juice without pulp, clear carbohydrate                 drink such as Clearfast of Gatorade, Black Coffee or Tea (Do not add                 anything to coffee or tea).  __X__2.  On the morning of surgery brush your teeth with toothpaste and water, you                may rinse your mouth with mouthwash if you wish.  Do not swallow any toothpaste of mouthwash.     _X__ 3.  No Alcohol for 24 hours before or after surgery.   _ 4.  Do Not Smoke or use e-cigarettes For 24 Hours Prior to Your Surgery.                 Do not use any chewable tobacco products for at least 6 hours prior to                 surgery.  ____  5.  Bring all medications with you on the day of surgery if instructed.   _x___  6.  Notify your doctor if there is any change in your medical condition      (cold, fever, infections).     Do not wear jewelry, make-up, hairpins, clips or nail polish. Do not wear lotions, powders, or perfumes. You may wear deodorant. Do not shave 48 hours prior to surgery. Men may shave face and neck. Do not bring valuables to the hospital.    [facility] is not responsible for any belongings or valuables.  Contacts, dentures  or bridgework may not be worn into surgery. Leave your suitcase in the car. After surgery it may be brought to your room. For patients admitted to the hospital, discharge time is determined by your treatment team.   Patients discharged the day of surgery will not be allowed to drive home.   Please read over the following fact sheets that you were given:    __x__ Take these medicines the morning of surgery with A SIP OF WATER:    1. diazepam (VALIUM) 2 MG tablet 1/2 tab.  2. metoprolol succinate (TOPROL-XL) 25 MG 24 hr tablet  3. loperamide (IMODIUM) 1 MG/5ML solution if needed  4.  5.  6.  ____ [employer]  Enema (as directed)   __x__ Use CHG Soap as directed  ____ Use inhalers on the day of surgery  ____ Stop metformin 2 days prior to surgery    ____ Take 1/2 of usual insulin dose the night before surgery. No insulin the morning          of surgery.   ____ Stop Coumadin/Plavix/aspirin on   ____ Stop Anti-inflammatories on    ____ Stop supplements until after surgery.    ____ Bring C-Pap to the hospital.

## 2018-12-26 ENCOUNTER — Other Ambulatory Visit: Payer: Self-pay

## 2018-12-26 ENCOUNTER — Other Ambulatory Visit
Admission: RE | Admit: 2018-12-26 | Discharge: 2018-12-26 | Disposition: A | Discharge status: Home / Self Care | Payer: 59 | Source: Ambulatory Visit | Attending: Surgery | Admitting: Surgery

## 2018-12-26 DIAGNOSIS — Z01812 Encounter for preprocedural laboratory examination: Secondary | ICD-10-CM | POA: Diagnosis not present

## 2018-12-27 LAB — NOVEL CORONAVIRUS, NAA (HOSP ORDER, SEND-OUT TO REF LAB; TAT 18-24 HRS): SARS-CoV-2, NAA: NOT DETECTED

## 2018-12-29 ENCOUNTER — Telehealth: Payer: Self-pay | Admitting: *Deleted

## 2018-12-29 MED ORDER — SODIUM CHLORIDE 0.9 % IV SOLN
1.0000 g | INTRAVENOUS | Status: AC
  Administered 2018-12-30: 1 g via INTRAVENOUS
  Filled 2018-12-29: qty 1

## 2018-12-29 NOTE — Telephone Encounter (Signed)
Patient called and is having surgery tomorrow and wants to know if she can take tylenol today. Please call and advise

## 2018-12-29 NOTE — Telephone Encounter (Signed)
Patient notified that she may take Tylenol prior to surgery.

## 2018-12-30 ENCOUNTER — Inpatient Hospital Stay: Payer: 59 | Admitting: Anesthesiology

## 2018-12-30 ENCOUNTER — Encounter
Admission: RE | Disposition: A | Discharge status: Home / Self Care | Payer: Self-pay | Source: Home / Self Care | Attending: Surgery

## 2018-12-30 ENCOUNTER — Other Ambulatory Visit: Payer: Self-pay

## 2018-12-30 ENCOUNTER — Inpatient Hospital Stay
Admission: RE | Admit: 2018-12-30 | Discharge: 2018-12-31 | DRG: 331 | Disposition: A | Discharge status: Home / Self Care | Payer: 59 | Attending: Surgery | Admitting: Surgery

## 2018-12-30 ENCOUNTER — Encounter: Payer: Self-pay | Admitting: *Deleted

## 2018-12-30 DIAGNOSIS — Z432 Encounter for attention to ileostomy: Principal | ICD-10-CM

## 2018-12-30 DIAGNOSIS — Z888 Allergy status to other drugs, medicaments and biological substances status: Secondary | ICD-10-CM

## 2018-12-30 DIAGNOSIS — K219 Gastro-esophageal reflux disease without esophagitis: Secondary | ICD-10-CM | POA: Diagnosis present

## 2018-12-30 DIAGNOSIS — Z9889 Other specified postprocedural states: Secondary | ICD-10-CM | POA: Diagnosis present

## 2018-12-30 DIAGNOSIS — K660 Peritoneal adhesions (postprocedural) (postinfection): Secondary | ICD-10-CM | POA: Diagnosis present

## 2018-12-30 DIAGNOSIS — E785 Hyperlipidemia, unspecified: Secondary | ICD-10-CM | POA: Diagnosis present

## 2018-12-30 DIAGNOSIS — M797 Fibromyalgia: Secondary | ICD-10-CM | POA: Diagnosis present

## 2018-12-30 DIAGNOSIS — Z8719 Personal history of other diseases of the digestive system: Secondary | ICD-10-CM

## 2018-12-30 DIAGNOSIS — Z882 Allergy status to sulfonamides status: Secondary | ICD-10-CM

## 2018-12-30 DIAGNOSIS — Z885 Allergy status to narcotic agent status: Secondary | ICD-10-CM

## 2018-12-30 DIAGNOSIS — M8580 Other specified disorders of bone density and structure, unspecified site: Secondary | ICD-10-CM | POA: Diagnosis present

## 2018-12-30 DIAGNOSIS — I10 Essential (primary) hypertension: Secondary | ICD-10-CM | POA: Diagnosis present

## 2018-12-30 DIAGNOSIS — Z932 Ileostomy status: Secondary | ICD-10-CM

## 2018-12-30 HISTORY — PX: ILEOSTOMY CLOSURE: SHX1784

## 2018-12-30 LAB — CBC
HCT: 42.8 % (ref 36.0–46.0)
Hemoglobin: 14.2 g/dL (ref 12.0–15.0)
MCH: 28.9 pg (ref 26.0–34.0)
MCHC: 33.2 g/dL (ref 30.0–36.0)
MCV: 87 fL (ref 80.0–100.0)
Platelets: 234 10*3/uL (ref 150–400)
RBC: 4.92 MIL/uL (ref 3.87–5.11)
RDW: 13.1 % (ref 11.5–15.5)
WBC: 12.1 10*3/uL — ABNORMAL HIGH (ref 4.0–10.5)
nRBC: 0 % (ref 0.0–0.2)

## 2018-12-30 LAB — CREATININE, SERUM
Creatinine, Ser: 0.48 mg/dL (ref 0.44–1.00)
GFR calc Af Amer: >60 mL/min (ref >60)
GFR calc non Af Amer: >60 mL/min (ref >60)

## 2018-12-30 SURGERY — CLOSURE, ILEOSTOMY
Anesthesia: General

## 2018-12-30 MED ORDER — ACETAMINOPHEN 500 MG PO TABS
1000.0000 mg | ORAL_TABLET | Freq: Four times a day (QID) | ORAL | Status: DC
  Administered 2018-12-30 – 2018-12-31 (×4): 1000 mg via ORAL
  Filled 2018-12-30 (×4): qty 2

## 2018-12-30 MED ORDER — PANTOPRAZOLE SODIUM 40 MG IV SOLR
40.0000 mg | Freq: Every day | INTRAVENOUS | Status: DC
  Administered 2018-12-30: 23:00:00 40 mg via INTRAVENOUS
  Filled 2018-12-30: qty 40

## 2018-12-30 MED ORDER — BUPIVACAINE-EPINEPHRINE (PF) 0.25% -1:200000 IJ SOLN
INTRAMUSCULAR | Status: AC
  Filled 2018-12-30: qty 30

## 2018-12-30 MED ORDER — HYDROMORPHONE HCL 1 MG/ML IJ SOLN
0.2500 mg | INTRAMUSCULAR | Status: DC | PRN
  Administered 2018-12-30 (×2): 0.25 mg via INTRAVENOUS

## 2018-12-30 MED ORDER — PROPOFOL 10 MG/ML IV BOLUS
INTRAVENOUS | Status: AC
  Filled 2018-12-30: qty 20

## 2018-12-30 MED ORDER — ENOXAPARIN SODIUM 40 MG/0.4ML ~~LOC~~ SOLN
40.0000 mg | SUBCUTANEOUS | Status: DC
  Administered 2018-12-31: 40 mg via SUBCUTANEOUS
  Filled 2018-12-30: qty 0.4

## 2018-12-30 MED ORDER — SUCCINYLCHOLINE CHLORIDE 20 MG/ML IJ SOLN
INTRAMUSCULAR | Status: AC
  Filled 2018-12-30: qty 1

## 2018-12-30 MED ORDER — OXYCODONE HCL 5 MG PO TABS: 5.0000 mg | ORAL_TABLET | ORAL | Status: DC | PRN

## 2018-12-30 MED ORDER — PROCHLORPERAZINE EDISYLATE 10 MG/2ML IJ SOLN
5.0000 mg | Freq: Four times a day (QID) | INTRAMUSCULAR | Status: DC | PRN
  Filled 2018-12-30: qty 2

## 2018-12-30 MED ORDER — SODIUM CHLORIDE FLUSH 0.9 % IV SOLN
INTRAVENOUS | Status: AC
  Filled 2018-12-30: qty 50

## 2018-12-30 MED ORDER — SUGAMMADEX SODIUM 200 MG/2ML IV SOLN
INTRAVENOUS | Status: AC
  Filled 2018-12-30: qty 2

## 2018-12-30 MED ORDER — SUMATRIPTAN SUCCINATE 50 MG PO TABS
100.0000 mg | ORAL_TABLET | ORAL | Status: DC | PRN
  Filled 2018-12-30: qty 2

## 2018-12-30 MED ORDER — FENTANYL CITRATE (PF) 100 MCG/2ML IJ SOLN
INTRAMUSCULAR | Status: DC | PRN
  Administered 2018-12-30: 75 ug via INTRAVENOUS
  Administered 2018-12-30: 25 ug via INTRAVENOUS

## 2018-12-30 MED ORDER — DEXAMETHASONE SODIUM PHOSPHATE 10 MG/ML IJ SOLN
INTRAMUSCULAR | Status: DC | PRN
  Administered 2018-12-30: 8 mg via INTRAVENOUS

## 2018-12-30 MED ORDER — PROMETHAZINE HCL 25 MG/ML IJ SOLN: 6.2500 mg | INTRAMUSCULAR | Status: DC | PRN

## 2018-12-30 MED ORDER — SCOPOLAMINE 1 MG/3DAYS TD PT72
MEDICATED_PATCH | TRANSDERMAL | Status: AC
  Administered 2018-12-30: 1.5 mg via TRANSDERMAL
  Filled 2018-12-30: qty 1

## 2018-12-30 MED ORDER — DIAZEPAM 2 MG PO TABS: 1.0000 mg | ORAL_TABLET | Freq: Three times a day (TID) | ORAL | Status: DC | PRN

## 2018-12-30 MED ORDER — FENTANYL CITRATE (PF) 250 MCG/5ML IJ SOLN
INTRAMUSCULAR | Status: AC
  Filled 2018-12-30: qty 5

## 2018-12-30 MED ORDER — GABAPENTIN 300 MG PO CAPS
300.0000 mg | ORAL_CAPSULE | Freq: Three times a day (TID) | ORAL | Status: DC
  Administered 2018-12-30 – 2018-12-31 (×3): 300 mg via ORAL
  Filled 2018-12-30 (×3): qty 1

## 2018-12-30 MED ORDER — FAMOTIDINE 20 MG PO TABS
20.0000 mg | ORAL_TABLET | Freq: Once | ORAL | Status: AC
  Administered 2018-12-30: 08:00:00 20 mg via ORAL

## 2018-12-30 MED ORDER — MIDAZOLAM HCL 2 MG/2ML IJ SOLN
INTRAMUSCULAR | Status: AC
  Filled 2018-12-30: qty 2

## 2018-12-30 MED ORDER — BUPIVACAINE-EPINEPHRINE 0.25% -1:200000 IJ SOLN
INTRAMUSCULAR | Status: DC | PRN
  Administered 2018-12-30: 30 mL

## 2018-12-30 MED ORDER — HYDRALAZINE HCL 20 MG/ML IJ SOLN: 10.0000 mg | INTRAMUSCULAR | Status: DC | PRN

## 2018-12-30 MED ORDER — ACETAMINOPHEN 500 MG PO TABS
1000.0000 mg | ORAL_TABLET | ORAL | Status: AC
  Administered 2018-12-30: 08:00:00 1000 mg via ORAL

## 2018-12-30 MED ORDER — DIPHENHYDRAMINE HCL 25 MG PO CAPS: 25.0000 mg | ORAL_CAPSULE | Freq: Four times a day (QID) | ORAL | Status: DC | PRN

## 2018-12-30 MED ORDER — CHLORHEXIDINE GLUCONATE CLOTH 2 % EX PADS: 6.0000 | MEDICATED_PAD | Freq: Once | CUTANEOUS | Status: DC

## 2018-12-30 MED ORDER — KETOROLAC TROMETHAMINE 30 MG/ML IJ SOLN
15.0000 mg | Freq: Four times a day (QID) | INTRAMUSCULAR | Status: DC
  Administered 2018-12-30 – 2018-12-31 (×3): 15 mg via INTRAVENOUS
  Filled 2018-12-30 (×3): qty 1

## 2018-12-30 MED ORDER — ZOLPIDEM TARTRATE 5 MG PO TABS: 5.0000 mg | ORAL_TABLET | Freq: Every evening | ORAL | Status: DC | PRN

## 2018-12-30 MED ORDER — DIPHENHYDRAMINE HCL 50 MG/ML IJ SOLN: 25.0000 mg | Freq: Four times a day (QID) | INTRAMUSCULAR | Status: DC | PRN

## 2018-12-30 MED ORDER — SODIUM CHLORIDE 0.9 % IV SOLN
INTRAVENOUS | Status: DC
  Administered 2018-12-30 – 2018-12-31 (×2): via INTRAVENOUS

## 2018-12-30 MED ORDER — METOPROLOL SUCCINATE ER 25 MG PO TB24
25.0000 mg | ORAL_TABLET | Freq: Every day | ORAL | Status: DC
  Administered 2018-12-31: 25 mg via ORAL
  Filled 2018-12-30: qty 1

## 2018-12-30 MED ORDER — ACETAMINOPHEN 500 MG PO TABS
ORAL_TABLET | ORAL | Status: AC
  Administered 2018-12-30: 1000 mg via ORAL
  Filled 2018-12-30: qty 2

## 2018-12-30 MED ORDER — LIDOCAINE HCL (CARDIAC) PF 100 MG/5ML IV SOSY
PREFILLED_SYRINGE | INTRAVENOUS | Status: DC | PRN
  Administered 2018-12-30: 60 mg via INTRAVENOUS

## 2018-12-30 MED ORDER — MIDAZOLAM HCL 2 MG/2ML IJ SOLN
INTRAMUSCULAR | Status: DC | PRN
  Administered 2018-12-30: 2 mg via INTRAVENOUS

## 2018-12-30 MED ORDER — PROCHLORPERAZINE MALEATE 10 MG PO TABS
10.0000 mg | ORAL_TABLET | Freq: Four times a day (QID) | ORAL | Status: DC | PRN
  Filled 2018-12-30: qty 1

## 2018-12-30 MED ORDER — LACTATED RINGERS IV SOLN
INTRAVENOUS | Status: DC
  Administered 2018-12-30: 09:00:00 via INTRAVENOUS

## 2018-12-30 MED ORDER — DEXAMETHASONE SODIUM PHOSPHATE 10 MG/ML IJ SOLN
INTRAMUSCULAR | Status: AC
  Filled 2018-12-30: qty 1

## 2018-12-30 MED ORDER — SUCCINYLCHOLINE CHLORIDE 20 MG/ML IJ SOLN
INTRAMUSCULAR | Status: DC | PRN
  Administered 2018-12-30: 80 mg via INTRAVENOUS

## 2018-12-30 MED ORDER — ROCURONIUM BROMIDE 100 MG/10ML IV SOLN
INTRAVENOUS | Status: DC | PRN
  Administered 2018-12-30 (×2): 10 mg via INTRAVENOUS
  Administered 2018-12-30: 20 mg via INTRAVENOUS

## 2018-12-30 MED ORDER — PROPOFOL 10 MG/ML IV BOLUS
INTRAVENOUS | Status: DC | PRN
  Administered 2018-12-30: 80 mg via INTRAVENOUS

## 2018-12-30 MED ORDER — SCOPOLAMINE 1 MG/3DAYS TD PT72
1.0000 | MEDICATED_PATCH | TRANSDERMAL | Status: DC
  Administered 2018-12-30: 10:00:00 1.5 mg via TRANSDERMAL

## 2018-12-30 MED ORDER — ONDANSETRON HCL 4 MG/2ML IJ SOLN: 4.0000 mg | Freq: Four times a day (QID) | INTRAMUSCULAR | Status: DC | PRN

## 2018-12-30 MED ORDER — ROCURONIUM BROMIDE 50 MG/5ML IV SOLN
INTRAVENOUS | Status: AC
  Filled 2018-12-30: qty 1

## 2018-12-30 MED ORDER — ONDANSETRON HCL 4 MG/2ML IJ SOLN
INTRAMUSCULAR | Status: DC | PRN
  Administered 2018-12-30: 4 mg via INTRAVENOUS

## 2018-12-30 MED ORDER — ONDANSETRON 4 MG PO TBDP: 4.0000 mg | ORAL_TABLET | Freq: Four times a day (QID) | ORAL | Status: DC | PRN

## 2018-12-30 MED ORDER — BUPIVACAINE LIPOSOME 1.3 % IJ SUSP
INTRAMUSCULAR | Status: AC
  Filled 2018-12-30: qty 20

## 2018-12-30 MED ORDER — ONDANSETRON HCL 4 MG/2ML IJ SOLN
INTRAMUSCULAR | Status: AC
  Filled 2018-12-30: qty 2

## 2018-12-30 MED ORDER — GABAPENTIN 300 MG PO CAPS
300.0000 mg | ORAL_CAPSULE | ORAL | Status: AC
  Administered 2018-12-30: 08:00:00 300 mg via ORAL

## 2018-12-30 MED ORDER — FAMOTIDINE 20 MG PO TABS
ORAL_TABLET | ORAL | Status: AC
  Administered 2018-12-30: 08:00:00 20 mg via ORAL
  Filled 2018-12-30: qty 1

## 2018-12-30 MED ORDER — BUPIVACAINE LIPOSOME 1.3 % IJ SUSP
INTRAMUSCULAR | Status: DC | PRN
  Administered 2018-12-30: 20 mL

## 2018-12-30 MED ORDER — HYDROMORPHONE HCL 1 MG/ML IJ SOLN
INTRAMUSCULAR | Status: AC
  Administered 2018-12-30: 11:00:00 0.25 mg via INTRAVENOUS
  Filled 2018-12-30: qty 1

## 2018-12-30 MED ORDER — LIDOCAINE HCL (PF) 2 % IJ SOLN
INTRAMUSCULAR | Status: AC
  Filled 2018-12-30: qty 10

## 2018-12-30 MED ORDER — SUGAMMADEX SODIUM 200 MG/2ML IV SOLN
INTRAVENOUS | Status: DC | PRN
  Administered 2018-12-30: 100.6 mg via INTRAVENOUS

## 2018-12-30 MED ORDER — MORPHINE SULFATE (PF) 2 MG/ML IV SOLN: 2.0000 mg | INTRAVENOUS | Status: DC | PRN

## 2018-12-30 MED ORDER — GABAPENTIN 300 MG PO CAPS
ORAL_CAPSULE | ORAL | Status: AC
  Administered 2018-12-30: 08:00:00 300 mg via ORAL
  Filled 2018-12-30: qty 1

## 2018-12-30 MED ORDER — BUPIVACAINE LIPOSOME 1.3 % IJ SUSP: 20.0000 mL | Freq: Once | INTRAMUSCULAR | Status: DC

## 2018-12-30 MED ORDER — KETOROLAC TROMETHAMINE 30 MG/ML IJ SOLN
30.0000 mg | Freq: Four times a day (QID) | INTRAMUSCULAR | Status: DC
  Administered 2018-12-30: 30 mg via INTRAVENOUS
  Filled 2018-12-30: qty 1

## 2018-12-30 SURGICAL SUPPLY — 43 items
APPLIER CLIP 11 MED OPEN (CLIP)
APPLIER CLIP 13 LRG OPEN (CLIP)
BLADE CLIPPER SURG (BLADE) IMPLANT
BNDG CONFORM 2 STRL LF (GAUZE/BANDAGES/DRESSINGS) IMPLANT
BRUSH SCRUB EZ  4% CHG (MISCELLANEOUS)
BRUSH SCRUB EZ 4% CHG (MISCELLANEOUS) IMPLANT
CATH URET ROBINSON 16FR STRL (CATHETERS) IMPLANT
CHLORAPREP W/TINT 26 (MISCELLANEOUS) ×3 IMPLANT
CLIP APPLIE 11 MED OPEN (CLIP) IMPLANT
CLIP APPLIE 13 LRG OPEN (CLIP) IMPLANT
COVER WAND RF STERILE (DRAPES) IMPLANT
DRAPE LAPAROTOMY 100X77 ABD (DRAPES) ×3 IMPLANT
DRSG OPSITE POSTOP 4X6 (GAUZE/BANDAGES/DRESSINGS) IMPLANT
DRSG TELFA 3X8 NADH (GAUZE/BANDAGES/DRESSINGS) IMPLANT
DRSG TELFA 4X3 1S NADH ST (GAUZE/BANDAGES/DRESSINGS) IMPLANT
ELECT BLADE 6.5 EXT (BLADE) IMPLANT
ELECT CAUTERY BLADE 6.4 (BLADE) ×3 IMPLANT
ELECT REM PT RETURN 9FT ADLT (ELECTROSURGICAL) ×3
ELECTRODE REM PT RTRN 9FT ADLT (ELECTROSURGICAL) ×1 IMPLANT
GAUZE SPONGE 4X4 12PLY STRL (GAUZE/BANDAGES/DRESSINGS) IMPLANT
GLOVE BIO SURGEON STRL SZ7 (GLOVE) ×12 IMPLANT
GOWN STRL REUS W/ TWL LRG LVL3 (GOWN DISPOSABLE) ×3 IMPLANT
GOWN STRL REUS W/TWL LRG LVL3 (GOWN DISPOSABLE) ×6
HANDLE SUCTION POOLE (INSTRUMENTS) IMPLANT
LIGASURE IMPACT 36 18CM CVD LR (INSTRUMENTS) IMPLANT
NEEDLE HYPO 22GX1.5 SAFETY (NEEDLE) ×3 IMPLANT
NS IRRIG 1000ML POUR BTL (IV SOLUTION) ×3 IMPLANT
PACK BASIN MAJOR ARMC (MISCELLANEOUS) ×3 IMPLANT
PACK COLON CLEAN CLOSURE (MISCELLANEOUS) IMPLANT
PLEDGET CV PTFE 7X3 (MISCELLANEOUS) IMPLANT
RELOAD LINEAR CUT PROX 55 BLUE (ENDOMECHANICALS) ×6 IMPLANT
RELOAD PROXIMATE 75MM BLUE (ENDOMECHANICALS) IMPLANT
SPONGE LAP 18X18 RF (DISPOSABLE) ×3 IMPLANT
STAPLER PROXIMATE 55 BLUE (STAPLE) ×3 IMPLANT
STAPLER PROXIMATE 75MM BLUE (STAPLE) IMPLANT
STAPLER SKIN PROX 35W (STAPLE) ×3 IMPLANT
SUCTION POOLE HANDLE (INSTRUMENTS)
SUT PDS PLUS 0 (SUTURE)
SUT PDS PLUS AB 0 CT-2 (SUTURE) IMPLANT
SUT SILK 2 0 SH CR/8 (SUTURE) ×3 IMPLANT
SYR 20CC LL (SYRINGE) IMPLANT
SYRINGE IRR TOOMEY STRL 70CC (SYRINGE) IMPLANT
TRAY FOLEY MTR SLVR 16FR STAT (SET/KITS/TRAYS/PACK) ×3 IMPLANT

## 2018-12-30 NOTE — Interval H&P Note (Signed)
History and Physical Interval Note:  12/30/2018 9:35 AM  Tasha Derek Jack  has presented today for surgery, with the diagnosis of ILEOSTOMY 293.2.  The various methods of treatment have been discussed with the patient and family. After consideration of risks, benefits and other options for treatment, the patient has consented to  Procedure(s): ILEOSTOMY TAKEDOWN (N/A) as a surgical intervention.  The patient's history has been reviewed, patient examined, no change in status, stable for surgery.  I have reviewed the patient's chart and labs.  Questions were answered to the patient's satisfaction.     [place]

## 2018-12-30 NOTE — Progress Notes (Addendum)
   12/30/18 1350  Clinical Encounter Type  Visited With Patient  Visit Type Initial  Referral From Nurse  Consult/Referral To Chaplain  Spiritual Encounters  Spiritual Needs Prayer   Chaplain received an OR to complete or update an AD. Upon arrival, the patient was awake and alert. She confirmed interest in completing the AD documentation while here. No questions at this time. Plans to complete tonight and request a chaplain to assist with finalization tomorrow.  She expressed gratitude for having had the reversal surgery for her ileostomy this morning and is looking forward to being able to go home and get to work on the recovery process, which she describes as being a "long road ahead". Patient describes a good network of support. This writer would surmise that that contributes to her hopeful outlook and ability to weather the storms of life. The patient is a person of faith and expressed gratitude for how God has kept her and allowed her to make it through this difficult time. Upon request, prayer offered for the patient's health and recovery. The patient expressed gratitude for the visit and welcomes a follow-up tomorrow.

## 2018-12-30 NOTE — Anesthesia Preprocedure Evaluation (Addendum)
Anesthesia Evaluation  Patient identified by MRN, date of birth, ID band Patient awake    Reviewed: Allergy & Precautions, H&P , NPO status , Patient's Chart, lab work & pertinent test results  History of Anesthesia Complications (+) PONV and history of anesthetic complications  Airway Mallampati: II  TM Distance: >3 FB Neck ROM: limited   Comment: Cervical DDD Dental  (+) Teeth Intact   Pulmonary neg pulmonary ROS, neg shortness of breath, neg COPD,           Cardiovascular hypertension, + dysrhythmias (occ palpitations, BBB)      Neuro/Psych  Headaches, PSYCHIATRIC DISORDERS Anxiety    GI/Hepatic Neg liver ROS, GERD  Medicated,  Endo/Other  negative endocrine ROS  Renal/GU      Musculoskeletal   Abdominal   Peds  Hematology negative hematology ROS (+)   Anesthesia Other Findings Past Medical History: 06/21/2015: Beat, premature ventricular No date: Bundle branch block     Comment:  palpitations a few weeks ago No date: DDD (degenerative disc disease), cervical 07/07/2009: Diverticulitis of colon No date: Dry eyes 06/21/2015: Essential (primary) hypertension No date: Fibromyalgia No date: GERD (gastroesophageal reflux disease) No date: Headache     Comment:  migraine No date: History of chicken pox No date: History of kidney stones No date: Hyperlipidemia No date: Hypertension 01/24/2015: IFG (impaired fasting glucose) No date: Lumbar herniated disc No date: OAB (overactive bladder) No date: Osteoarthritis 02/10/2016: Osteopenia determined by x-ray No date: Recurrent UTI No date: Urinary incontinence No date: Vertigo  Past Surgical History: 11/11/2015: COLONOSCOPY WITH PROPOFOL; N/A     Comment:  Procedure: COLONOSCOPY WITH PROPOFOL;  Surgeon: Lollie Sails, MD;  Location: [facility] ENDOSCOPY;  Service:               Endoscopy;  Laterality: N/A; 06/24/2018: COLONOSCOPY WITH PROPOFOL; N/A    Comment:  Procedure: COLONOSCOPY WITH PROPOFOL;  Surgeon:               Lollie Sails, MD;  Location: [facility] ENDOSCOPY;                Service: Endoscopy;  Laterality: N/A; 11/13/2013: ESOPHAGOGASTRODUODENOSCOPY 08/03/2018: ILEOSTOMY; N/A     Comment:  Procedure: ILEOSTOMY;  Surgeon: Jules Husbands, MD;                Location: [facility] ORS;  Service: General;  Laterality: N/A; 07/29/2018: LAPAROSCOPIC RIGHT COLECTOMY; N/A     Comment:  Procedure: LAPAROSCOPIC SIGMOID COLECTOMY;  Surgeon:               Jules Husbands, MD;  Location: [facility] ORS;  Service:               General;  Laterality: N/A; 08/03/2018: LAPAROTOMY; N/A     Comment:  Procedure: EXPLORATORY LAPAROTOMY;  Surgeon: Jules Husbands, MD;  Location: [facility] ORS;  Service: General;                Laterality: N/A; No date: OOPHORECTOMY Jan 2012: TOTAL ABDOMINAL HYSTERECTOMY     Comment:  Complete, due to heavy bleeding and grape-fruit size               fibroid   BMI    Body Mass Index:  19.35 kg/m      Reproductive/Obstetrics negative OB ROS  Anesthesia Physical Anesthesia Plan  ASA: II  Anesthesia Plan: General ETT   Post-op Pain Management:    Induction:   PONV Risk Score and Plan: Ondansetron, Dexamethasone, Midazolam, Treatment may vary due to age or medical condition and Scopolamine patch - Pre-op  Airway Management Planned:   Additional Equipment:   Intra-op Plan:   Post-operative Plan:   Informed Consent: I have reviewed the patients History and Physical, chart, labs and discussed the procedure including the risks, benefits and alternatives for the proposed anesthesia with the patient or authorized representative who has indicated his/her understanding and acceptance.     Dental Advisory Given  Plan Discussed with: Anesthesiologist and CRNA  Anesthesia Plan Comments:        Anesthesia Quick Evaluation

## 2018-12-30 NOTE — Anesthesia Post-op Follow-up Note (Signed)
Anesthesia QCDR form completed.        

## 2018-12-30 NOTE — Anesthesia Procedure Notes (Signed)
Procedure Name: Intubation Date/Time: 12/30/2018 10:01 AM Performed by: Rona Ravens, CRNA Pre-anesthesia Checklist: Patient identified, Emergency Drugs available, Suction available and Patient being monitored Patient Re-evaluated:Patient Re-evaluated prior to induction Oxygen Delivery Method: Circle system utilized Preoxygenation: Pre-oxygenation with 100% oxygen Induction Type: IV induction and Rapid sequence Laryngoscope Size: Mac and 3 Grade View: Grade III Tube type: Oral Tube size: 7.0 mm Number of attempts: 1 Placement Confirmation: ETT inserted through vocal cords under direct vision,  CO2 detector,  positive ETCO2 and breath sounds checked- equal and bilateral Secured at: 22 cm Tube secured with: Tape Dental Injury: Teeth and Oropharynx as per pre-operative assessment

## 2018-12-30 NOTE — Transfer of Care (Signed)
Immediate Anesthesia Transfer of Care Note  Patient: Whitney Cooper  Procedure(s) Performed: ILEOSTOMY TAKEDOWN (N/A )  Patient Location: PACU  Anesthesia Type:General  Level of Consciousness: awake, alert  and oriented  Airway & Oxygen Therapy: Patient Spontanous Breathing and Patient connected to face mask oxygen  Post-op Assessment: Report given to RN and Post -op Vital signs reviewed and stable  Post vital signs: Reviewed and stable  Last Vitals:  Vitals Value Taken Time  BP 125/98 12/30/2018 11:16 AM  Temp 36.3 C 12/30/2018 11:15 AM  Pulse 94 12/30/2018 11:18 AM  Resp 10 12/30/2018 11:18 AM  SpO2 100 % 12/30/2018 11:18 AM  Vitals shown include unvalidated device data.  Last Pain:  Vitals:   12/30/18 0802  TempSrc: Oral  PainSc: 0-No pain         Complications: No apparent anesthesia complications

## 2018-12-30 NOTE — Op Note (Signed)
PROCEDURES: 1. Ileostomy takedown  Pre-operative Diagnosis: Hx loop ileostomy for contained anastomotic leak after Sigmoid colectomy  Post-operative Diagnosis: same  Surgeon: Marjory Lies    Assistants: Dr. Rosana Hoes, Required for exposure and due to the lack of first assist.  Anesthesia: General endotracheal anesthesia  ASA Class: 2  Surgeon: Caroleen Hamman , MD FACS  Anesthesia: Gen. with endotracheal tube  Findings: Widely patent anastomosis Adhesions from the SB to the abd wall  Estimated Blood Loss: 10cc         Drains: none         Specimens: ileostomy       Complications: none              Condition: stable  Procedure Details  The patient was seen again in the Holding Room. The benefits, complications, treatment options, and expected outcomes were discussed with the patient. The risks of bleeding, infection, recurrence of symptoms, failure to resolve symptoms,  bowel injury, any of which could require further surgery were reviewed with the patient.   The patient was taken to Operating Room, identified as [facility] and the procedure verified.  A Time Out was held and the above information confirmed.  Prior to the induction of general anesthesia, antibiotic prophylaxis was administered. VTE prophylaxis was in place. General endotracheal anesthesia was then administered and tolerated well. After the induction, the abdomen was prepped with Chloraprep and draped in the sterile fashion we placed a tegaderm around ileostomy site. The patient was positioned in the supine position. Elliptical incision was created to incorporate the ileostomy.  Tegaderm was removed.  Using electrocautery we were able to dissect the ileostomy from the fascia in the standard fashion.  Circumferential dissection was carried out.  There was some adhesions from small bowel to the abdominal wall that were able to lysed with Metzenbaum scissors.  Once we had adequate dissection and a good window were able to  identify of both the a ferritin and the ephedrine loops.  We created a side-to-side functional end-to-end anastomosis with 55 GIA standard blue load stapler.  There was no evidence of anastomotic leak and there was good perfusion to the anastomosis without any stricture. The posterior and the anterior fascia were closed with a 0 PDS suture in a running fashion and the skin was closed with staples. Liposomal Marcaine was injected on all incision sites under direct visualization. Needle and laparotomy count were correct and there were no immediate occasions  Caroleen Hamman, MD, FACS

## 2018-12-30 NOTE — Interval H&P Note (Signed)
History and Physical Interval Note:  12/30/2018 9:35 AM  Whitney Cooper  has presented today for surgery, with the diagnosis of ILEOSTOMY 293.2.  The various methods of treatment have been discussed with the patient and family. After consideration of risks, benefits and other options for treatment, the patient has consented to  Procedure(s): ILEOSTOMY TAKEDOWN (N/A) as a surgical intervention.  The patient's history has been reviewed, patient examined, no change in status, stable for surgery.  I have reviewed the patient's chart and labs.  Questions were answered to the patient's satisfaction.     [place]

## 2018-12-31 ENCOUNTER — Encounter: Payer: Self-pay | Admitting: Surgery

## 2018-12-31 DIAGNOSIS — Z888 Allergy status to other drugs, medicaments and biological substances status: Secondary | ICD-10-CM | POA: Diagnosis not present

## 2018-12-31 DIAGNOSIS — Z432 Encounter for attention to ileostomy: Secondary | ICD-10-CM | POA: Diagnosis present

## 2018-12-31 DIAGNOSIS — Z8719 Personal history of other diseases of the digestive system: Secondary | ICD-10-CM | POA: Diagnosis not present

## 2018-12-31 DIAGNOSIS — K219 Gastro-esophageal reflux disease without esophagitis: Secondary | ICD-10-CM | POA: Diagnosis present

## 2018-12-31 DIAGNOSIS — M8580 Other specified disorders of bone density and structure, unspecified site: Secondary | ICD-10-CM | POA: Diagnosis present

## 2018-12-31 DIAGNOSIS — E785 Hyperlipidemia, unspecified: Secondary | ICD-10-CM | POA: Diagnosis present

## 2018-12-31 DIAGNOSIS — M797 Fibromyalgia: Secondary | ICD-10-CM | POA: Diagnosis present

## 2018-12-31 DIAGNOSIS — Z882 Allergy status to sulfonamides status: Secondary | ICD-10-CM | POA: Diagnosis not present

## 2018-12-31 DIAGNOSIS — I10 Essential (primary) hypertension: Secondary | ICD-10-CM | POA: Diagnosis present

## 2018-12-31 DIAGNOSIS — K660 Peritoneal adhesions (postprocedural) (postinfection): Secondary | ICD-10-CM | POA: Diagnosis present

## 2018-12-31 DIAGNOSIS — Z885 Allergy status to narcotic agent status: Secondary | ICD-10-CM | POA: Diagnosis not present

## 2018-12-31 MED ORDER — MENTHOL 3 MG MT LOZG
1.0000 | LOZENGE | OROMUCOSAL | Status: DC | PRN
  Filled 2018-12-31: qty 9

## 2018-12-31 MED ORDER — AMOXICILLIN-POT CLAVULANATE 875-125 MG PO TABS: 1.0000 | ORAL_TABLET | Freq: Two times a day (BID) | ORAL | 0 refills | Status: AC

## 2018-12-31 MED ORDER — OXYCODONE HCL 5 MG PO TABS: 5.0000 mg | ORAL_TABLET | ORAL | 0 refills | Status: DC | PRN

## 2018-12-31 NOTE — Discharge Instructions (Signed)
In addition to included general post-operative instructions for ileostomy reversa,  Diet: Resume home diet.   Activity: No heavy lifting >20 pounds (children, pets, laundry, garbage) or strenuous activity until follow-up in 2 weeks, but light activity and walking are encouraged. Do not drive or drink alcohol if taking narcotic pain medications.  Wound care: Remove dressing in 2 days unless otherwise instructed (06/11). Once dressing removed, 2 days after surgery (06/11), you may shower/get incision wet with soapy water and pat dry (do not rub incisions), but no baths or submerging incision underwater until follow-up.   Medications: Resume all home medications. For mild to moderate pain: acetaminophen (Tylenol) or ibuprofen/naproxen (if no kidney disease). Combining Tylenol with alcohol can substantially increase your risk of causing liver disease. Narcotic pain medications, if prescribed, can be used for severe pain, though may cause nausea, constipation, and drowsiness. Do not combine Tylenol and Percocet (or similar) within a 6 hour period as Percocet (and similar) contain(s) Tylenol. If you do not need the narcotic pain medication, you do not need to fill the prescription.  Call office [phone] / [phone]) at any time if any questions, worsening pain, fevers/chills, bleeding, drainage from incision site, or other concerns.

## 2018-12-31 NOTE — Plan of Care (Signed)
Nutrition Education Note   RD consulted for diet education regarding post op healing  58 y.o. female with a history of ileostomy creation secondary to anastomotic leak following sigmoid colectomy. She presented to [facility] on 12/30/2018 for scheduled ileostomy takedown with Dr Dahlia Byes.  Spoke with pt via phone today. Pt reports good appetite and oral intake while in hospital. Pt also reports a bowel movement today.   RD educated pt regarding the importance of adequate nutrition needed for wound healing. Recommended minimum of 80g/day of protein. Advised pt of ways to increase calories and protein in her diet as recommended utilization of supplements. Recommended daily MVI and vitamin C. Pt reports a 40lb unintentional weight loss. Recommended pt to continue supplements after wound healing until she reaches her UBW.   RD will provide pt with Ensure coupons via mail  Koleen Distance MS, [place], [place] Pager #- [phone] Office#- [phone] After Hours Pager: [phone]

## 2018-12-31 NOTE — Anesthesia Postprocedure Evaluation (Signed)
Anesthesia Post Note  Patient: Whitney Cooper  Procedure(s) Performed: ILEOSTOMY TAKEDOWN (N/A )  Patient location during evaluation: PACU Anesthesia Type: General Level of consciousness: awake and alert Pain management: pain level controlled Vital Signs Assessment: post-procedure vital signs reviewed and stable Respiratory status: spontaneous breathing, nonlabored ventilation and respiratory function stable Cardiovascular status: blood pressure returned to baseline and stable Postop Assessment: no apparent nausea or vomiting Anesthetic complications: no     Last Vitals:  Vitals:   12/31/18 1006 12/31/18 1201  BP: 109/83 111/71  Pulse: 88 66  Resp:    Temp:  36.4 C  SpO2:  100%    Last Pain:  Vitals:   12/31/18 1201  TempSrc: Oral  PainSc:                  Durenda Hurt

## 2018-12-31 NOTE — Discharge Summary (Signed)
[facility] SURGICAL DISCHARGE SUMMARY  Patient ID: Whitney Cooper MRN: [id number] DOB/AGE: [birth year] 60 y.o.  Admit date: 12/30/2018 Discharge date: 12/31/2018  Discharge Diagnoses Ileostomy s/p closure  Consultants None  Procedures 12/30/2018:  Ileostomy Takedown  HPI: Whitney Cooper is a 58 y.o. female with a history of ileostomy creation secondary to anastomotic leak following sigmoid colectomy. She presented to [facility] on 12/30/2018 for scheduled ileostomy takedown with Dr Dahlia Byes.   Hospital Course: Informed consent was obtained and documented, and patient underwent uneventful ileostomy takedown (Dr Dahlia Byes, 12/30/2018).  Post-operatively, patient did not have issue with pain and advancement of patient's diet and ambulation were well-tolerated. The remainder of patient's hospital course was essentially unremarkable, and discharge planning was initiated accordingly with patient safely able to be discharged home with appropriate discharge instructions, pain control, and outpatient follow-up after all of her questions were answered to her expressed satisfaction.  Discharge Condition: Good   Physical Examination:  Constitutional: Well appearing female, NAD Pulmonary: Normal effort, no respiratory distress Gastrointestinal: Soft, mild incisional soreness, non-distended, no rebound/guarding Skin: Previous RLQ ileostomy incision is CDI with staples and honeycomb, mild erythema to inferior aspect, no drainage.      Allergies as of 12/31/2018      Reactions   Baclofen Other (See Comments)   Dizziness   Codeine Other (See Comments)   jittery   Hydrocodone Other (See Comments)   Headache,dizziness   Hydroxyzine    Made her very nervous   Sulfa Antibiotics Rash      Medication List    STOP taking these medications   polyethylene glycol powder 17 GM/SCOOP powder Commonly known as:  GLYCOLAX/MIRALAX     TAKE these medications   acetaminophen 500 MG  tablet Commonly known as:  TYLENOL Take 1,000 mg by mouth 2 (two) times a day.   amoxicillin-clavulanate 875-125 MG tablet Commonly known as:  Augmentin Take 1 tablet by mouth 2 (two) times daily for 7 days.   antiseptic oral rinse Liqd 15 mLs by Mouth Rinse route as needed for dry mouth.   atorvastatin 20 MG tablet Commonly known as:  LIPITOR Take 1 tablet (20 mg total) by mouth every other day. What changed:  additional instructions   Co Q-10 100 MG Caps Take 100 mg by mouth daily.   diazepam 2 MG tablet Commonly known as:  Valium Take 0.5 tablets (1 mg total) by mouth every 8 (eight) hours as needed for anxiety.   esomeprazole 40 MG capsule Commonly known as:  NEXIUM Take 40 mg by mouth daily before breakfast. Empty contents in applesauce and take daily   Fish Oil 1000 MG Caps Take 1,000 mg by mouth daily.   fluconazole 150 MG tablet Commonly known as:  DIFLUCAN   loperamide 1 MG/5ML solution Commonly known as:  IMODIUM Take 1 mg by mouth 3 (three) times daily as needed for diarrhea or loose stools.   meclizine 25 MG tablet Commonly known as:  ANTIVERT Take 25 mg by mouth 3 (three) times daily as needed for dizziness.   metoprolol succinate 25 MG 24 hr tablet Commonly known as:  TOPROL-XL Take 1 tablet (25 mg total) by mouth daily.   multivitamin with minerals Tabs tablet Take 1 tablet by mouth daily with lunch. Centrum Silver Chewable   oxyCODONE 5 MG immediate release tablet Commonly known as:  Oxy IR/ROXICODONE Take 1-2 tablets (5-10 mg total) by mouth every 4 (four) hours as needed for moderate pain.   Simply Saline 0.9 %  Aers Generic drug:  Saline Place 1 spray into both nostrils daily as needed (dry nasal passages).   SUMAtriptan 100 MG tablet Commonly known as:  IMITREX Take 100 mg by mouth every 2 (two) hours as needed for migraine.   Systane Balance 0.6 % Soln Generic drug:  Propylene Glycol Place 1 drop into both eyes 2 (two) times daily as  needed (dry/irritated eyes.).   Vitamin D3 25 MCG (1000 UT) Caps Take 1,000 Units by mouth daily.            Discharge Care Instructions  (From admission, onward)         Start     Ordered   12/31/18 0000  Discharge wound care:    Comments:  Remove honey comb dressing tomorrow (06/11), okay to shower at that time too   12/31/18 1007           Follow-up Information    P[place], [place] F, MD. Schedule an appointment as soon as possible for a visit on 01/12/2019.   Specialty:  General Surgery Why:  S/p ileostomy takedown Contact information: [address] [place] [address] [phone]            Time spent on discharge management including discussion of hospital course, clinical condition, outpatient instructions, prescriptions, and follow up with the patient and members of the medical team: >30 minutes  -- Edison Simon , PA-C [facility]  12/31/2018, 10:08 AM [phone] M-F: 7am - 4pm

## 2018-12-31 NOTE — Progress Notes (Signed)
Patient had 2 maroon colored BMs (2nd less than 1st). Dr. Dahlia Byes notified and stated that it was fine for patient to still be discharged, that this was expected. Patient alert and oriented, vss, and ready to discharge. D/c instructions provided to patient and reported no questions. Patient d/c'd home with husband. Whitney Cooper

## 2019-01-01 ENCOUNTER — Telehealth: Payer: Self-pay | Admitting: *Deleted

## 2019-01-01 LAB — SURGICAL PATHOLOGY

## 2019-01-01 NOTE — Telephone Encounter (Signed)
Advised patient she may take allergy pill. The patient is aware to call back for any questions or concerns.

## 2019-01-01 NOTE — Telephone Encounter (Signed)
Patient called and stated that she had surgery on 12/30/18 and is having some drainage in her throat, she wants to know if she can take a Claritin or any allergy pill. Please call and advise

## 2019-01-06 ENCOUNTER — Telehealth: Payer: Self-pay | Admitting: *Deleted

## 2019-01-06 NOTE — Telephone Encounter (Signed)
I did call the pt and she will take tylenol 1 gm q 6hrs and ibuprofen 800 mg q 8hrs, she is otherwise doing well, eating and not having any fevers

## 2019-01-06 NOTE — Telephone Encounter (Signed)
Patient called and stated that she wants to know if she can get something other than oxycodone because its makes her dizzy and sleepy. Please call and advise

## 2019-01-10 ENCOUNTER — Other Ambulatory Visit: Payer: Self-pay | Admitting: Family Medicine

## 2019-01-10 DIAGNOSIS — E782 Mixed hyperlipidemia: Secondary | ICD-10-CM

## 2019-01-12 ENCOUNTER — Encounter: Payer: Self-pay | Admitting: Surgery

## 2019-01-12 ENCOUNTER — Other Ambulatory Visit: Payer: Self-pay

## 2019-01-12 ENCOUNTER — Ambulatory Visit (INDEPENDENT_AMBULATORY_CARE_PROVIDER_SITE_OTHER): Payer: 59 | Admitting: Surgery

## 2019-01-12 VITALS — BP 121/76 | HR 86 | Temp 97.9°F | Ht 62.0 in | Wt 113.0 lb

## 2019-01-12 DIAGNOSIS — Z09 Encounter for follow-up examination after completed treatment for conditions other than malignant neoplasm: Secondary | ICD-10-CM

## 2019-01-12 NOTE — Progress Notes (Signed)
S/p ileostomy takedown Doing very well No fevers or chill,s taking PO, regular BMs   PE NAD Abd: soft, nt , incision healing well, staples removed, no infection or peritonitis.  A/p Doing very well RTC prn No heavy lifting

## 2019-01-21 ENCOUNTER — Telehealth: Payer: Self-pay

## 2019-01-21 NOTE — Telephone Encounter (Signed)
Copied from [place] [id number]. Topic: General - Other >> Jan 21, 2019  9:31 AM Rainey Pines A wrote: Patient wants to speak with nurse about medication Claritin for her allergies . Patient stated medication was making her feel nervous and she would like OTC recommendations. Please advise.

## 2019-01-21 NOTE — Telephone Encounter (Signed)
She can try zyrtec or allegra OTC

## 2019-01-21 NOTE — Telephone Encounter (Signed)
Left detailed voicemail

## 2019-01-21 NOTE — Telephone Encounter (Signed)
Pt called to say she would try Zyrtec and she would call back if it did not work for her. Please advise.

## 2019-02-18 ENCOUNTER — Other Ambulatory Visit: Payer: Self-pay

## 2019-02-18 ENCOUNTER — Telehealth: Payer: Self-pay

## 2019-02-18 ENCOUNTER — Encounter: Payer: Self-pay | Admitting: Nurse Practitioner

## 2019-02-18 ENCOUNTER — Ambulatory Visit (INDEPENDENT_AMBULATORY_CARE_PROVIDER_SITE_OTHER): Payer: 59 | Admitting: Nurse Practitioner

## 2019-02-18 VITALS — BP 124/72 | HR 80 | Temp 97.1°F | Resp 14 | Ht 62.5 in | Wt 120.4 lb

## 2019-02-18 DIAGNOSIS — Z13820 Encounter for screening for osteoporosis: Secondary | ICD-10-CM

## 2019-02-18 DIAGNOSIS — Z1239 Encounter for other screening for malignant neoplasm of breast: Secondary | ICD-10-CM | POA: Diagnosis not present

## 2019-02-18 DIAGNOSIS — Z0000 Encounter for general adult medical examination without abnormal findings: Secondary | ICD-10-CM | POA: Diagnosis not present

## 2019-02-18 DIAGNOSIS — Z9079 Acquired absence of other genital organ(s): Secondary | ICD-10-CM

## 2019-02-18 DIAGNOSIS — F410 Panic disorder [episodic paroxysmal anxiety] without agoraphobia: Secondary | ICD-10-CM

## 2019-02-18 DIAGNOSIS — Z780 Asymptomatic menopausal state: Secondary | ICD-10-CM

## 2019-02-18 DIAGNOSIS — I700 Atherosclerosis of aorta: Secondary | ICD-10-CM | POA: Diagnosis not present

## 2019-02-18 DIAGNOSIS — R7301 Impaired fasting glucose: Secondary | ICD-10-CM | POA: Diagnosis not present

## 2019-02-18 DIAGNOSIS — Z90722 Acquired absence of ovaries, bilateral: Secondary | ICD-10-CM

## 2019-02-18 MED ORDER — DIAZEPAM 2 MG PO TABS: 2.0000 mg | ORAL_TABLET | Freq: Two times a day (BID) | ORAL | 0 refills | Status: DC | PRN

## 2019-02-18 NOTE — Patient Instructions (Addendum)
-   ask your insurance company about the shingrex vaccine  General recommendations: 150 minutes of physical activity weekly, eat two servings of fish weekly, eat one serving of tree nuts ( cashews, pistachios, pecans, almonds.Marland Kitchen) every other day, eat 6 servings of fruit/vegetables daily and drink plenty of water and avoid sweet beverages. Recommend at least 64 ounces of water daily.   If you have not heard anything from my staff in a week about any orders/referrals/studies from today, please contact us here to follow-up [phone]. Please do call to schedule your mammogram & DEXA scan; the number to schedule one at either [facility] or [facility] is [phone]   Stay Safe in the Sun The majority of sun exposure occurs before age 26 and skin cancer can take 20 years or more to develop. Whether your sun bathing days are behind you or you still spend time pursuing the perfect tan, you should be concerned about skin cancer.  Remember, the sun's ultraviolet (UV) rays can reflect off water, sand, concrete and snow, and can reach below the water's surface. Certain types of UV light penetrate fog and clouds, so it's possible to get sunburn even on overcast days.  Avoid direct sunlight as much as possible during the peak sun hours, generally 10 a.m. to 3 p.m., or seek shade during this part of day. Wear broad-spectrum sunscreen - with an SPF of at least 30 - containing both UVA and UVB protection. Look for ingredients like [employer] (also known as avobenzone) or titanium dioxide on the label. Reapply sunscreen frequently, at least every two hours when outdoors, especially if you perspire or you've been swimming. Your best bet is to choose water-resistant products that are more likely to stay on your skin. Wear lip balm with an SPF 15 or higher. Wear a hat and other protective clothing while in the sun. Tightly woven fibers and darker clothing generally provide more  protection. Also, look for products approved by the American Academy of Dermat[employer]. Wear UV-protective sunglasses.

## 2019-02-18 NOTE — Telephone Encounter (Signed)
Copied from [place] [id number]. Topic: General - Other >> Feb 18, 2019 11:48 AM Ivar Drape wrote: Reason for CRM:   An order for a bone density was sent for the patient, but it was sent by Dr. Sanda Klein and they can't use that one.  A current practicing provider will have to send in another order for the bone density to the [facility].

## 2019-02-18 NOTE — Progress Notes (Addendum)
Name: Whitney Cooper   MRN: [id number]    DOB: [birth year]   Date:02/18/2019       Progress Note  Subjective  Chief Complaint  Chief Complaint  Patient presents with  . Annual Exam    HPI   Patient presents for annual CPE.  Had illeostomy reversed last month after steady weight loss, and has increase weight and appetite and more energy- still overall very fatigued.  Diet:  Has added whey protein to diet to increase protein doing a lot of shakes with fruits and protein, lots of eggs and chicken, salmon.  Eats mostly broccoli and green beans Eats some rice but has been snacking a lot on cake Drinking lots of water with lemon- at least 64 ounces a day and ginger tea and some-camomile tea and pedialyte but hasn't had much  Exercise:  Has increased activity since surgery- has been doing light gardening and walking close to a mile a day.   USPSTF grade A and B recommendations    Office Visit from 02/18/2019 in [facility]  AUDIT-C Score  0     Depression: Phq 9 is  negative Depression screen [facility] 2/9 02/18/2019 11/04/2018 10/01/2018 09/02/2018 06/26/2018  Decreased Interest 0 0 0 1 1  Down, Depressed, Hopeless 0 0 0 1 0  PHQ - 2 Score 0 0 0 2 1  Altered sleeping 0 3 0 1 1  Tired, decreased energy 0 3 0 3 2  Change in appetite 0 0 0 0 1  Feeling bad or failure about yourself  0 1 0 0 1  Trouble concentrating 0 0 0 0 0  Moving slowly or fidgety/restless 0 0 0 0 0  Suicidal thoughts 0 0 0 0 0  PHQ-9 Score 0 7 0 6 6  Difficult doing work/chores Not difficult at all Somewhat difficult Not difficult at all Not difficult at all Somewhat difficult  Some recent data might be hidden   Hypertension: BP Readings from Last 3 Encounters:  02/18/19 124/72  01/12/19 121/76  12/31/18 111/71   Obesity: Wt Readings from Last 3 Encounters:  02/18/19 120 lb 6.4 oz (54.6 kg)  01/12/19 113 lb (51.3 kg)  12/30/18 111 lb (50.3 kg)   BMI Readings from Last 3 Encounters:   02/18/19 21.67 kg/m  01/12/19 20.67 kg/m  12/30/18 19.35 kg/m    Hep C Screening: completed STD testing and prevention (HIV/chl/gon/syphilis): declines Intimate partner violence: declines Sexual History/Pain during Intercourse: denies  Menstrual History/LMP/Abnormal Bleeding: denies   Advanced Care Planning: A voluntary discussion about advance care planning including the explanation and discussion of advance directives.  Discussed health care proxy and Living will, and the patient was able to identify a health care proxy as husband- Leann Mayweather.  Patient does not have a living will at present time. If patient does have living will, I have requested they bring this to the clinic to be scanned in to their chart.  Breast cancer:  HM Mammogram  Date Value Ref Range Status  03/05/2017 0-4 Bi-Rad 0-4 Bi-Rad, Self Reported Normal Final    Cervical cancer screening: has cervix removed.   Osteoporosis Screening:  No results found for: HMDEXASCAN  Lipids:  Lab Results  Component Value Date   CHOL 191 02/13/2018   CHOL 135 08/16/2017   CHOL 179 02/12/2017   Lab Results  Component Value Date   HDL 54 02/13/2018   HDL 45 08/16/2017   HDL 44 02/12/2017   Lab Results  Component  Value Date   LDLCALC 114 (H) 02/13/2018   LDLCALC 69 08/16/2017   LDLCALC 98 02/12/2017   Lab Results  Component Value Date   TRIG 114 02/13/2018   TRIG 103 08/16/2017   TRIG 184 (H) 02/12/2017   Lab Results  Component Value Date   CHOLHDL 3.5 02/13/2018   CHOLHDL 3.0 08/16/2017   CHOLHDL 4.1 02/12/2017   No results found for: LDLDIRECT  Glucose:  Glucose  Date Value Ref Range Status  09/02/2018 89 65 - 99 mg/dL Final  12/30/2013 101 (H) 65 - 99 mg/dL Final   Glucose, Bld  Date Value Ref Range Status  08/04/2018 148 (H) 70 - 99 mg/dL Final  08/03/2018 90 70 - 99 mg/dL Final  08/02/2018 117 (H) 70 - 99 mg/dL Final    Skin cancer: discussed; annual follow-up with derm.  Colorectal  cancer:    Lung cancer:  Low Dose CT Chest recommended if Age 29-80 years, 30 pack-year currently smoking OR have quit w/in 15years. Patient does not qualify.    Patient Active Problem List   Diagnosis Date Noted  . S/P closure of ileostomy 12/30/2018  . S/P colectomy 07/29/2018  . Aortic atherosclerosis ([place]) 04/15/2018  . Preventative health care 02/12/2018  . Anxiety 12/13/2017  . Insomnia 04/10/2016  . Upper back pain, chronic 02/10/2016  . Neck pain, chronic 02/10/2016  . Medication monitoring encounter 11/15/2015  . Essential (primary) hypertension 06/21/2015  . Combined fat and carbohydrate induced hyperlipemia 06/21/2015  . Beat, premature ventricular 06/21/2015  . Urinary frequency 03/15/2015  . Strangury 03/15/2015  . Incontinence 03/15/2015  . Atrophic vaginitis 03/15/2015  . Abnormal thyroid function test 01/24/2015  . IFG (impaired fasting glucose) 01/24/2015  . Urinary retention with incomplete bladder emptying 01/19/2015  . Hyperlipidemia   . Osteoarthritis   . GERD (gastroesophageal reflux disease)   . Fibromyalgia   . Diverticulitis of colon 07/07/2009    Past Surgical History:  Procedure Laterality Date  . COLONOSCOPY WITH PROPOFOL N/A 11/11/2015   Procedure: COLONOSCOPY WITH PROPOFOL;  Surgeon: Lollie Sails, MD;  Location: [facility] ENDOSCOPY;  Service: Endoscopy;  Laterality: N/A;  . COLONOSCOPY WITH PROPOFOL N/A 06/24/2018   Procedure: COLONOSCOPY WITH PROPOFOL;  Surgeon: Lollie Sails, MD;  Location: [facility] ENDOSCOPY;  Service: Endoscopy;  Laterality: N/A;  . ESOPHAGOGASTRODUODENOSCOPY  11/13/2013  . ILEOSTOMY N/A 08/03/2018   Procedure: ILEOSTOMY;  Surgeon: Jules Husbands, MD;  Location: [facility] ORS;  Service: General;  Laterality: N/A;  . ILEOSTOMY CLOSURE N/A 12/30/2018   Procedure: ILEOSTOMY TAKEDOWN;  Surgeon: Jules Husbands, MD;  Location: [facility] ORS;  Service: General;  Laterality: N/A;  . LAPAROSCOPIC RIGHT COLECTOMY N/A 07/29/2018   Procedure:  LAPAROSCOPIC SIGMOID COLECTOMY;  Surgeon: Jules Husbands, MD;  Location: [facility] ORS;  Service: General;  Laterality: N/A;  . LAPAROTOMY N/A 08/03/2018   Procedure: EXPLORATORY LAPAROTOMY;  Surgeon: Jules Husbands, MD;  Location: [facility] ORS;  Service: General;  Laterality: N/A;  . OOPHORECTOMY    . TOTAL ABDOMINAL HYSTERECTOMY  Jan 2012   Complete, due to heavy bleeding and grape-fruit size fibroid     Family History  Problem Relation Age of Onset  . Hypertension Mother   . Cancer Mother        skin  . Heart disease Father        CHF  . Hypertension Father   . Stroke Father        mini strokes  . Cancer Father  skin  . Heart attack Father   . Thyroid disease Father   . Ovarian cancer Maternal Grandmother 66  . Cancer Maternal Grandmother        ovarian/brain  . Heart attack Maternal Grandfather   . Heart attack Paternal Grandfather   . Diabetes Maternal Uncle   . Thyroid disease Sister   . Breast cancer Sister   . Kidney disease Paternal Grandmother     Social History   Socioeconomic History  . Marital status: Married    Spouse name: Chrissie Noa  . Number of children: 2  . Years of education: Not on file  . Highest education level: Not on file  Occupational History  . Not on file  Social Needs  . Financial resource strain: Not hard at all  . Food insecurity    Worry: Never true    Inability: Never true  . Transportation needs    Medical: No    Non-medical: No  Tobacco Use  . Smoking status: Never Smoker  . Smokeless tobacco: Never Used  Substance and Sexual Activity  . Alcohol use: No  . Drug use: Never  . Sexual activity: Yes    Birth control/protection: None, Surgical  Lifestyle  . Physical activity    Days per week: 5 days    Minutes per session: 30 min  . Stress: Not at all  Relationships  . Social connections    Talks on phone: More than three times a week    Gets together: Three times a week    Attends religious service: More than 4 times per year     Active member of club or organization: Yes    Attends meetings of clubs or organizations: More than 4 times per year    Relationship status: Married  . Intimate partner violence    Fear of current or ex partner: No    Emotionally abused: No    Physically abused: No    Forced sexual activity: No  Other Topics Concern  . Not on file  Social History Narrative   She is scheduled to have surgery on her colon on 07/29/2018.     Current Outpatient Medications:  .  acetaminophen (TYLENOL) 500 MG tablet, Take 1,000 mg by mouth 2 (two) times a day. , Disp: , Rfl:  .  antiseptic oral rinse (BIOTENE) LIQD, 15 mLs by Mouth Rinse route as needed for dry mouth., Disp: , Rfl:  .  atorvastatin (LIPITOR) 20 MG tablet, Take 1 tablet (20 mg total) by mouth every other day. In the evening., Disp: 90 tablet, Rfl: 0 .  cetirizine (ZYRTEC) 10 MG tablet, Take 10 mg by mouth daily., Disp: , Rfl:  .  Cholecalciferol (VITAMIN D3) 1000 units CAPS, Take 1,000 Units by mouth daily. , Disp: , Rfl:  .  Coenzyme Q10 (CO Q-10) 100 MG CAPS, Take 100 mg by mouth daily. , Disp: , Rfl:  .  esomeprazole (NEXIUM) 40 MG capsule, Take 40 mg by mouth daily before breakfast. Empty contents in applesauce and take daily, Disp: , Rfl:  .  metoprolol succinate (TOPROL-XL) 25 MG 24 hr tablet, Take 1 tablet (25 mg total) by mouth daily., Disp: 30 tablet, Rfl: 5 .  Multiple Vitamin (MULTIVITAMIN WITH MINERALS) TABS tablet, Take 1 tablet by mouth daily with lunch. Centrum Silver Chewable, Disp: , Rfl:  .  Omega-3 Fatty Acids (FISH OIL) 1000 MG CAPS, Take 1,000 mg by mouth daily. , Disp: , Rfl:  .  Saline (SIMPLY SALINE) 0.9 %  AERS, Place 1 spray into both nostrils daily as needed (dry nasal passages)., Disp: , Rfl:  .  SYSTANE BALANCE 0.6 % SOLN, Place 1 drop into both eyes 2 (two) times daily as needed (dry/irritated eyes.)., Disp: , Rfl:  .  vitamin C (ASCORBIC ACID) 250 MG tablet, Take 250 mg by mouth daily., Disp: , Rfl:  .  diazepam  (VALIUM) 2 MG tablet, Take 0.5 tablets (1 mg total) by mouth every 8 (eight) hours as needed for anxiety. (Patient not taking: Reported on 02/18/2019), Disp: 10 tablet, Rfl: 0 .  fluconazole (DIFLUCAN) 150 MG tablet, , Disp: , Rfl:  .  loperamide (IMODIUM) 1 MG/5ML solution, Take 1 mg by mouth 3 (three) times daily as needed for diarrhea or loose stools. , Disp: , Rfl:  .  meclizine (ANTIVERT) 25 MG tablet, Take 25 mg by mouth 3 (three) times daily as needed for dizziness. , Disp: , Rfl:  .  oxyCODONE (OXY IR/ROXICODONE) 5 MG immediate release tablet, Take 1-2 tablets (5-10 mg total) by mouth every 4 (four) hours as needed for moderate pain. (Patient not taking: Reported on 02/18/2019), Disp: 15 tablet, Rfl: 0 .  SUMAtriptan (IMITREX) 100 MG tablet, Take 100 mg by mouth every 2 (two) hours as needed for migraine. , Disp: , Rfl:   Allergies  Allergen Reactions  . Baclofen Other (See Comments)    Dizziness  . Codeine Other (See Comments)    jittery  . Hydrocodone Other (See Comments)    Headache,dizziness  . Hydroxyzine     Made her very nervous  . Sulfa Antibiotics Rash     Review of Systems  Constitutional: Positive for malaise/fatigue (improving). Negative for chills and fever.  HENT: Positive for congestion. Negative for sinus pain and sore throat.   Eyes: Negative for blurred vision and double vision.  Respiratory: Negative for cough and shortness of breath.   Cardiovascular: Negative for chest pain, palpitations and leg swelling.  Gastrointestinal: Negative for abdominal pain, blood in stool, constipation, diarrhea and nausea.  Genitourinary: Negative for dysuria and urgency.  Musculoskeletal: Positive for joint pain (neck and upper back pain). Negative for falls.  Skin: Negative for rash.  Neurological: Negative for dizziness, tingling, weakness and headaches.  Endo/Heme/Allergies: Negative for polydipsia.  Psychiatric/Behavioral: The patient is not nervous/anxious and does not  have insomnia.       Objective  Vitals:   02/18/19 0820  BP: 124/72  Pulse: 80  Resp: 14  Temp: (!) 97.1 F (36.2 C)  TempSrc: Temporal  SpO2: 99%  Weight: 120 lb 6.4 oz (54.6 kg)  Height: 5' 2.5" (1.588 m)    Body mass index is 21.67 kg/m.  Physical Exam Constitutional: Patient appears well-developed and well-nourished. No distress.  HENT: Head: Normocephalic and atraumatic.  Eyes: Conjunctivae and EOM are normal. Pupils are equal, round, and reactive to light. No scleral icterus.  Neck: Normal range of motion. Neck supple. No JVD present. No thyromegaly present.  Cardiovascular: Normal rate, regular rhythm and normal heart sounds.  No murmur heard. No BLE edema. Pulmonary/Chest: Effort normal and breath sounds normal. No respiratory distress. Abdominal: Soft. Bowel sounds are normal, no distension. There is no tenderness. no masses scars healed well. Breast: no lumps or masses, no nipple discharge or rashes FEMALE GENITALIA: deferred Musculoskeletal: Normal range of motion, no joint effusions. No gross deformities Neurological: he is alert and oriented to person, place, and time. No cranial nerve deficit. Coordination, balance, strength, speech and gait are normal.  Skin: Skin is warm and dry. No rash noted. No erythema.  Psychiatric: Patient has a normal mood and affect. behavior is normal. Judgment and thought content normal.    Fall Risk: Fall Risk  02/18/2019 12/08/2018 11/04/2018 10/01/2018 10/01/2018  Falls in the past year? 0 0 0 0 0  Number falls in past yr: 0 - 0 0 0  Injury with Fall? 0 - 0 0 0     Functional Status Survey: Is the patient deaf or have difficulty hearing?: No Does the patient have difficulty seeing, even when wearing glasses/contacts?: No Does the patient have difficulty concentrating, remembering, or making decisions?: No Does the patient have difficulty walking or climbing stairs?: No Does the patient have difficulty dressing or bathing?:  No Does the patient have difficulty doing errands alone such as visiting a doctor's office or shopping?: No   Assessment & Plan  1. Preventative health care - CBC with Differential - COMPLETE METABOLIC PANEL WITH GFR - Lipid Profile - HgB A1c  2. Screening for breast cancer  - MM Digital Screening; Future  3. IFG (impaired fasting glucose)  - HgB A1c  4. Aortic atherosclerosis (HCC) - Lipid Profile  5. Panic attack PMP[place] reviewed, informed next refill may be hydroxyzine or consider daily medication - diazepam (VALIUM) 2 MG tablet; Take 1 tablet (2 mg total) by mouth every 12 (twelve) hours as needed for anxiety.  Dispense: 10 tablet; Refill: 0   -USPSTF grade A and B recommendations reviewed with patient; age-appropriate recommendations, preventive care, screening tests, etc discussed and encouraged; healthy living encouraged; see AVS for patient education given to patient -Discussed importance of 150 minutes of physical activity weekly, eat two servings of fish weekly, eat one serving of tree nuts ( cashews, pistachios, pecans, almonds.Marland Kitchen) every other day, eat 6 servings of fruit/vegetables daily and drink plenty of water and avoid sweet beverages.   -Reviewed Health Maintenance:

## 2019-02-19 LAB — LIPID PANEL
Chol/HDL Ratio: 3.8 ratio (ref 0.0–4.4)
Cholesterol, Total: 211 mg/dL — ABNORMAL HIGH (ref 100–199)
HDL: 56 mg/dL (ref >39)
LDL Calculated: 131 mg/dL — ABNORMAL HIGH (ref 0–99)
Triglycerides: 118 mg/dL (ref 0–149)
VLDL Cholesterol Cal: 24 mg/dL (ref 5–40)

## 2019-02-19 LAB — CBC WITH DIFFERENTIAL/PLATELET
Basophils Absolute: 0 10*3/uL (ref 0.0–0.2)
Basos: 0 %
EOS (ABSOLUTE): 0.3 10*3/uL (ref 0.0–0.4)
Eos: 3 %
Hematocrit: 38.8 % (ref 34.0–46.6)
Hemoglobin: 12.8 g/dL (ref 11.1–15.9)
Immature Grans (Abs): 0 10*3/uL (ref 0.0–0.1)
Immature Granulocytes: 1 %
Lymphocytes Absolute: 2.2 10*3/uL (ref 0.7–3.1)
Lymphs: 26 %
MCH: 28.8 pg (ref 26.6–33.0)
MCHC: 33 g/dL (ref 31.5–35.7)
MCV: 87 fL (ref 79–97)
Monocytes Absolute: 0.6 10*3/uL (ref 0.1–0.9)
Monocytes: 8 %
Neutrophils Absolute: 5.3 10*3/uL (ref 1.4–7.0)
Neutrophils: 62 %
Platelets: 290 10*3/uL (ref 150–450)
RBC: 4.45 x10E6/uL (ref 3.77–5.28)
RDW: 11.9 % (ref 11.7–15.4)
WBC: 8.5 10*3/uL (ref 3.4–10.8)

## 2019-02-19 LAB — CMP14+EGFR
ALT: 49 IU/L — ABNORMAL HIGH (ref 0–32)
AST: 26 IU/L (ref 0–40)
Albumin/Globulin Ratio: 2.2 (ref 1.2–2.2)
Albumin: 4.6 g/dL (ref 3.8–4.9)
Alkaline Phosphatase: 54 IU/L (ref 39–117)
BUN/Creatinine Ratio: 20 (ref 12–28)
BUN: 12 mg/dL (ref 8–27)
Bilirubin Total: 0.4 mg/dL (ref 0.0–1.2)
CO2: 25 mmol/L (ref 20–29)
Calcium: 9.7 mg/dL (ref 8.7–10.3)
Chloride: 105 mmol/L (ref 96–106)
Creatinine, Ser: 0.61 mg/dL (ref 0.57–1.00)
GFR calc Af Amer: 114 mL/min/{1.73_m2} (ref >59)
GFR calc non Af Amer: 99 mL/min/{1.73_m2} (ref >59)
Globulin, Total: 2.1 g/dL (ref 1.5–4.5)
Glucose: 86 mg/dL (ref 65–99)
Potassium: 4.9 mmol/L (ref 3.5–5.2)
Sodium: 146 mmol/L — ABNORMAL HIGH (ref 134–144)
Total Protein: 6.7 g/dL (ref 6.0–8.5)

## 2019-02-19 LAB — HEMOGLOBIN A1C
Est. average glucose Bld gHb Est-mCnc: 97 mg/dL
Hgb A1c MFr Bld: 5 % (ref 4.8–5.6)

## 2019-03-02 ENCOUNTER — Telehealth: Payer: Self-pay

## 2019-03-02 NOTE — Telephone Encounter (Signed)
Copied from [place] [id number]. Topic: General - Inquiry >> Mar 02, 2019  2:43 PM Reyne Dumas L wrote: Reason for CRM:   Pt calling to find out if her lab work is back yet.

## 2019-03-02 NOTE — Telephone Encounter (Signed)
Blood counts are normal  Sugar and a1c are normal  Kidney function looks good.  Sodium is mildly elevated- decrease the amount of sodium in your diet mildly.  ALT- liver enzyme is mildly elevated but not concerning, we will follow this routinely. ____ Lab results had been sent through [facility]

## 2019-03-02 NOTE — Telephone Encounter (Signed)
Please review labs. 

## 2019-03-03 NOTE — Telephone Encounter (Signed)
Left detailed voicemail

## 2019-03-25 ENCOUNTER — Other Ambulatory Visit: Payer: Self-pay | Admitting: Nurse Practitioner

## 2019-03-25 ENCOUNTER — Ambulatory Visit
Admission: RE | Admit: 2019-03-25 | Discharge: 2019-03-25 | Disposition: A | Discharge status: Home / Self Care | Payer: 59 | Source: Ambulatory Visit | Attending: Nurse Practitioner | Admitting: Nurse Practitioner

## 2019-03-25 DIAGNOSIS — Z9079 Acquired absence of other genital organ(s): Secondary | ICD-10-CM | POA: Insufficient documentation

## 2019-03-25 DIAGNOSIS — Z13820 Encounter for screening for osteoporosis: Secondary | ICD-10-CM | POA: Insufficient documentation

## 2019-03-25 DIAGNOSIS — Z1239 Encounter for other screening for malignant neoplasm of breast: Secondary | ICD-10-CM | POA: Insufficient documentation

## 2019-03-25 DIAGNOSIS — Z780 Asymptomatic menopausal state: Secondary | ICD-10-CM | POA: Insufficient documentation

## 2019-03-25 DIAGNOSIS — Z90722 Acquired absence of ovaries, bilateral: Secondary | ICD-10-CM | POA: Insufficient documentation

## 2019-04-08 ENCOUNTER — Telehealth: Payer: Self-pay

## 2019-04-08 NOTE — Telephone Encounter (Signed)
Copied from [place] [id number]. Topic: General - Inquiry >> Apr 08, 2019  9:23 AM Virl Axe D wrote: Reason for CRM: Pt called to follow up on results of bone density test done on 03/25/19. She did not receive results. Requesting CB. Please advise.

## 2019-04-08 NOTE — Telephone Encounter (Signed)
Patient notified

## 2019-04-17 ENCOUNTER — Encounter: Payer: Self-pay | Admitting: Family Medicine

## 2019-04-17 ENCOUNTER — Ambulatory Visit (INDEPENDENT_AMBULATORY_CARE_PROVIDER_SITE_OTHER): Payer: 59 | Admitting: Family Medicine

## 2019-04-17 ENCOUNTER — Other Ambulatory Visit: Payer: Self-pay

## 2019-04-17 VITALS — BP 128/76 | HR 82 | Temp 97.6°F | Resp 16 | Ht 63.0 in | Wt 126.0 lb

## 2019-04-17 DIAGNOSIS — J310 Chronic rhinitis: Secondary | ICD-10-CM

## 2019-04-17 DIAGNOSIS — R300 Dysuria: Secondary | ICD-10-CM | POA: Diagnosis not present

## 2019-04-17 DIAGNOSIS — N760 Acute vaginitis: Secondary | ICD-10-CM

## 2019-04-17 DIAGNOSIS — J329 Chronic sinusitis, unspecified: Secondary | ICD-10-CM

## 2019-04-17 LAB — POCT URINALYSIS DIPSTICK
Bilirubin, UA: NEGATIVE
Blood, UA: NEGATIVE
Glucose, UA: NEGATIVE
Ketones, UA: NEGATIVE
Leukocytes, UA: NEGATIVE
Nitrite, UA: NEGATIVE
Protein, UA: NEGATIVE
Spec Grav, UA: 1.01 (ref 1.010–1.025)
Urobilinogen, UA: NEGATIVE E.U./dL — AB
pH, UA: 5 (ref 5.0–8.0)

## 2019-04-17 MED ORDER — LEVOCETIRIZINE DIHYDROCHLORIDE 5 MG PO TABS: 5.0000 mg | ORAL_TABLET | Freq: Every evening | ORAL | 2 refills | Status: AC

## 2019-04-17 MED ORDER — FLUCONAZOLE 150 MG PO TABS: 150.0000 mg | ORAL_TABLET | Freq: Once | ORAL | 0 refills | Status: AC

## 2019-04-17 MED ORDER — FLUTICASONE PROPIONATE 50 MCG/ACT NA SUSP: 2.0000 | Freq: Every day | NASAL | 2 refills | Status: AC

## 2019-04-17 NOTE — Patient Instructions (Signed)
Urine testing - I'll call you with results  For vaginal irritation - use topical creams and vaginal suppositories for a few days to a week - like monistat   If you do not improve then take the oral diflucan medicine.  If your testing is negative and you still have symptoms I recommend seeing urology or coming back for pelvic exam and more tests.

## 2019-04-17 NOTE — Progress Notes (Signed)
Patient ID: Whitney Cooper, female    DOB: 05/11/1961, 58 y.o.   MRN: [id number]  PCP: Delsa Grana, PA-C  No chief complaint on file.   Subjective:   Whitney Cooper is a 58 y.o. female, presents to clinic with CC of the following:  HPI  Pt presents with UTI sx, states she had UTI 03/05/2019 and then again 04/10/2019.  Went to [employer] clinic at [place] [facility].  Treated with macrobid both times, she says the urine culture was negative. After tx some of her sx are getting better -  She is no longer peeing blood, and it is not as painful, but she is still having urinary frequency, urgency and still some less frequent "bladder spasms" feels like it is irritated until she pees then it feels better.   She has some left low back pain, no flank pain, No change to appetite, denies N, V, F, C, S.  Hx of overactive bladder years ago did testing, she is not on any medications.  10-15 years ago, possibly was done at [facility]?  Results for orders placed or performed in visit on 04/17/19  POCT urinalysis dipstick  Result Value Ref Range   Color, UA yellow    Clarity, UA clear    Glucose, UA Negative Negative   Bilirubin, UA negative    Ketones, UA negative    Spec Grav, UA 1.010 1.010 - 1.025   Blood, UA negative    pH, UA 5.0 5.0 - 8.0   Protein, UA Negative Negative   Urobilinogen, UA negative (A) 0.2 or 1.0 E.U./dL   Nitrite, UA negative    Leukocytes, UA Negative Negative   Appearance clear    Odor none    Urine dip results today negative.  She does have some vaginal itching and it feel raw with some burning with she urinates or wipes.  Has not tried anything topically, nothing OTC but she ate yogurt the last couple days.  She also complains of nasal drainage, sinus pain and pressure and headaches for 2-3 weeks.  She has profuse clear drainage and postnasal drip says at night she feels like she is choking to death.  She has tenderness in her cheeks.  She is using  saline nasal spray and taking over-the-counter antihistamines with her not helping very much.  She believes lately she has tried Zyrtec and Claritin.  She denies sore throat, fever, neck pain, swollen lymph nodes, ear symptoms  Patient Active Problem List   Diagnosis Date Noted  . S/P closure of ileostomy 12/30/2018  . S/P colectomy 07/29/2018  . Aortic atherosclerosis ([place]) 04/15/2018  . Preventative health care 02/12/2018  . Anxiety 12/13/2017  . Insomnia 04/10/2016  . Upper back pain, chronic 02/10/2016  . Neck pain, chronic 02/10/2016  . Medication monitoring encounter 11/15/2015  . Essential (primary) hypertension 06/21/2015  . Combined fat and carbohydrate induced hyperlipemia 06/21/2015  . Beat, premature ventricular 06/21/2015  . Urinary frequency 03/15/2015  . Strangury 03/15/2015  . Incontinence 03/15/2015  . Atrophic vaginitis 03/15/2015  . Abnormal thyroid function test 01/24/2015  . IFG (impaired fasting glucose) 01/24/2015  . Urinary retention with incomplete bladder emptying 01/19/2015  . Hyperlipidemia   . Osteoarthritis   . GERD (gastroesophageal reflux disease)   . Fibromyalgia   . Diverticulitis of colon 07/07/2009      Current Outpatient Medications:  .  acetaminophen (TYLENOL) 500 MG tablet, Take 1,000 mg by mouth 2 (two) times a day. , Disp: ,  Rfl:  .  antiseptic oral rinse (BIOTENE) LIQD, 15 mLs by Mouth Rinse route as needed for dry mouth., Disp: , Rfl:  .  atorvastatin (LIPITOR) 20 MG tablet, Take 1 tablet (20 mg total) by mouth every other day. In the evening., Disp: 90 tablet, Rfl: 0 .  cetirizine (ZYRTEC) 10 MG tablet, Take 10 mg by mouth daily., Disp: , Rfl:  .  Cholecalciferol (VITAMIN D3) 1000 units CAPS, Take 1,000 Units by mouth daily. , Disp: , Rfl:  .  Coenzyme Q10 (CO Q-10) 100 MG CAPS, Take 100 mg by mouth daily. , Disp: , Rfl:  .  diazepam (VALIUM) 2 MG tablet, Take 1 tablet (2 mg total) by mouth every 12 (twelve) hours as needed for  anxiety., Disp: 10 tablet, Rfl: 0 .  esomeprazole (NEXIUM) 40 MG capsule, Take 40 mg by mouth daily before breakfast. Empty contents in applesauce and take daily, Disp: , Rfl:  .  loperamide (IMODIUM) 1 MG/5ML solution, Take 1 mg by mouth 3 (three) times daily as needed for diarrhea or loose stools. , Disp: , Rfl:  .  meclizine (ANTIVERT) 25 MG tablet, Take 25 mg by mouth 3 (three) times daily as needed for dizziness. , Disp: , Rfl:  .  metoprolol succinate (TOPROL-XL) 25 MG 24 hr tablet, Take 1 tablet (25 mg total) by mouth daily., Disp: 30 tablet, Rfl: 5 .  Multiple Vitamin (MULTIVITAMIN WITH MINERALS) TABS tablet, Take 1 tablet by mouth daily with lunch. Centrum Silver Chewable, Disp: , Rfl:  .  Omega-3 Fatty Acids (FISH OIL) 1000 MG CAPS, Take 1,000 mg by mouth daily. , Disp: , Rfl:  .  Saline (SIMPLY SALINE) 0.9 % AERS, Place 1 spray into both nostrils daily as needed (dry nasal passages)., Disp: , Rfl:  .  SUMAtriptan (IMITREX) 100 MG tablet, Take 100 mg by mouth every 2 (two) hours as needed for migraine. , Disp: , Rfl:  .  SYSTANE BALANCE 0.6 % SOLN, Place 1 drop into both eyes 2 (two) times daily as needed (dry/irritated eyes.)., Disp: , Rfl:  .  vitamin C (ASCORBIC ACID) 250 MG tablet, Take 250 mg by mouth daily., Disp: , Rfl:    Allergies  Allergen Reactions  . Baclofen Other (See Comments)    Dizziness  . Codeine Other (See Comments)    jittery  . Hydrocodone Other (See Comments)    Headache,dizziness  . Hydroxyzine     Made her very nervous  . Sulfa Antibiotics Rash     Family History  Problem Relation Age of Onset  . Hypertension Mother   . Cancer Mother        skin  . Heart disease Father        CHF  . Hypertension Father   . Stroke Father        mini strokes  . Cancer Father        skin  . Heart attack Father   . Thyroid disease Father   . Ovarian cancer Maternal Grandmother 66  . Cancer Maternal Grandmother        ovarian/brain  . Heart attack Maternal  Grandfather   . Heart attack Paternal Grandfather   . Diabetes Maternal Uncle   . Thyroid disease Sister   . Breast cancer Sister 59  . Kidney disease Paternal Grandmother      Social History   Socioeconomic History  . Marital status: Married    Spouse name: Chrissie Noa  . Number of children: 2  .  Years of education: Not on file  . Highest education level: Not on file  Occupational History  . Not on file  Social Needs  . Financial resource strain: Not hard at all  . Food insecurity    Worry: Never true    Inability: Never true  . Transportation needs    Medical: No    Non-medical: No  Tobacco Use  . Smoking status: Never Smoker  . Smokeless tobacco: Never Used  Substance and Sexual Activity  . Alcohol use: No  . Drug use: Never  . Sexual activity: Yes    Birth control/protection: None, Surgical  Lifestyle  . Physical activity    Days per week: 5 days    Minutes per session: 30 min  . Stress: Not at all  Relationships  . Social connections    Talks on phone: More than three times a week    Gets together: Three times a week    Attends religious service: More than 4 times per year    Active member of club or organization: Yes    Attends meetings of clubs or organizations: More than 4 times per year    Relationship status: Married  . Intimate partner violence    Fear of current or ex partner: No    Emotionally abused: No    Physically abused: No    Forced sexual activity: No  Other Topics Concern  . Not on file  Social History Narrative   She is scheduled to have surgery on her colon on 07/29/2018.   I personally reviewed active problem list, medication list, allergies, notes from last encounter, multiple [facility] encounters and results from care everywhere in chart, also recent lab results with the patient/caregiver today.  Review of Systems  Constitutional: Negative.   HENT: Negative.   Eyes: Negative.   Respiratory: Negative.   Cardiovascular: Negative.    Gastrointestinal: Negative.   Endocrine: Negative.   Genitourinary: Negative.   Musculoskeletal: Negative.   Skin: Negative.   Allergic/Immunologic: Negative.   Neurological: Negative.   Hematological: Negative.   Psychiatric/Behavioral: Negative.   All other systems reviewed and are negative.      Objective:   There were no vitals filed for this visit.  There is no height or weight on file to calculate BMI.  Physical Exam Vitals signs and nursing note reviewed.  Constitutional:      General: She is not in acute distress.    Appearance: Normal appearance. She is well-developed. She is not ill-appearing, toxic-appearing or diaphoretic.  HENT:     Head: Normocephalic and atraumatic.     Jaw: There is normal jaw occlusion.     Salivary Glands: Right salivary gland is not diffusely enlarged or tender. Left salivary gland is not diffusely enlarged or tender.     Right Ear: Hearing, tympanic membrane, ear canal and external ear normal. No tenderness. No middle ear effusion. No mastoid tenderness.     Left Ear: Hearing, tympanic membrane, ear canal and external ear normal. No tenderness.  No middle ear effusion. No mastoid tenderness.     Nose: Mucosal edema, congestion and rhinorrhea present.     Right Sinus: No maxillary sinus tenderness or frontal sinus tenderness.     Left Sinus: No maxillary sinus tenderness or frontal sinus tenderness.     Comments: Nasal mucosa mildly erythematous, no sinus ttp    Mouth/Throat:     Mouth: Mucous membranes are moist. Mucous membranes are not pale.     Pharynx:  Oropharynx is clear. Uvula midline. No oropharyngeal exudate, posterior oropharyngeal erythema or uvula swelling.     Tonsils: No tonsillar abscesses.  Eyes:     General: No scleral icterus.       Right eye: No discharge.        Left eye: No discharge.     Conjunctiva/sclera: Conjunctivae normal.     Pupils: Pupils are equal, round, and reactive to light.  Neck:     Musculoskeletal:  Normal range of motion and neck supple.     Trachea: No tracheal deviation.  Cardiovascular:     Rate and Rhythm: Normal rate and regular rhythm.     Heart sounds: Normal heart sounds.  Pulmonary:     Effort: Pulmonary effort is normal. No respiratory distress.     Breath sounds: No stridor. No wheezing, rhonchi or rales.  Abdominal:     General: Bowel sounds are normal. There is no distension.     Palpations: Abdomen is soft.  Musculoskeletal: Normal range of motion.  Skin:    General: Skin is warm and dry.     Coloration: Skin is not pale.     Findings: No rash.  Neurological:     Mental Status: She is alert.     Motor: No abnormal muscle tone.     Coordination: Coordination normal.  Psychiatric:        Behavior: Behavior normal.        Results for orders placed or performed in visit on 02/18/19  CBC with Differential  Result Value Ref Range   WBC 8.5 3.4 - 10.8 x10E3/uL   RBC 4.45 3.77 - 5.28 x10E6/uL   Hemoglobin 12.8 11.1 - 15.9 g/dL   Hematocrit 38.8 34.0 - 46.6 %   MCV 87 79 - 97 fL   MCH 28.8 26.6 - 33.0 pg   MCHC 33.0 31.5 - 35.7 g/dL   RDW 11.9 11.7 - 15.4 %   Platelets 290 150 - 450 x10E3/uL   Neutrophils 62 Not Estab. %   Lymphs 26 Not Estab. %   Monocytes 8 Not Estab. %   Eos 3 Not Estab. %   Basos 0 Not Estab. %   Neutrophils Absolute 5.3 1.4 - 7.0 x10E3/uL   Lymphocytes Absolute 2.2 0.7 - 3.1 x10E3/uL   Monocytes Absolute 0.6 0.1 - 0.9 x10E3/uL   EOS (ABSOLUTE) 0.3 0.0 - 0.4 x10E3/uL   Basophils Absolute 0.0 0.0 - 0.2 x10E3/uL   Immature Granulocytes 1 Not Estab. %   Immature Grans (Abs) 0.0 0.0 - 0.1 x10E3/uL  CMP14+EGFR  Result Value Ref Range   Glucose 86 65 - 99 mg/dL   BUN 12 8 - 27 mg/dL   Creatinine, Ser 0.61 0.57 - 1.00 mg/dL   GFR calc non Af Amer 99 >59 mL/min/1.73   GFR calc Af Amer 114 >59 mL/min/1.73   BUN/Creatinine Ratio 20 12 - 28   Sodium 146 (H) 134 - 144 mmol/L   Potassium 4.9 3.5 - 5.2 mmol/L   Chloride 105 96 - 106  mmol/L   CO2 25 20 - 29 mmol/L   Calcium 9.7 8.7 - 10.3 mg/dL   Total Protein 6.7 6.0 - 8.5 g/dL   Albumin 4.6 3.8 - 4.9 g/dL   Globulin, Total 2.1 1.5 - 4.5 g/dL   Albumin/Globulin Ratio 2.2 1.2 - 2.2   Bilirubin Total 0.4 0.0 - 1.2 mg/dL   Alkaline Phosphatase 54 39 - 117 IU/L   AST 26 0 - 40 IU/L  ALT 49 (H) 0 - 32 IU/L  HgB A1c  Result Value Ref Range   Hgb A1c MFr Bld 5.0 4.8 - 5.6 %   Est. average glucose Bld gHb Est-mCnc 97 mg/dL  Lipid Profile  Result Value Ref Range   Cholesterol, Total 211 (H) 100 - 199 mg/dL   Triglycerides 118 0 - 149 mg/dL   HDL 56 >39 mg/dL   VLDL Cholesterol Cal 24 5 - 40 mg/dL   LDL Calculated 131 (H) 0 - 99 mg/dL   Chol/HDL Ratio 3.8 0.0 - 4.4 ratio        Assessment & Plan:      ICD-10-CM   1. Dysuria  R30.0 POCT urinalysis dipstick    Urine Culture   neg UA -culture pending, possibly from yeast or OAB, possibly IC no abx indicated right now, will need f/up appt with [place] or specialist if not improving  2. Acute vaginitis  N76.0 fluconazole (DIFLUCAN) 150 MG tablet   OTC treatments first (ie monistat) if not improving diflucan dose sent in  3. Rhinosinusitis  J32.9    suspect allergic rhinitis, no ttp, no fever, will not tx for ABS, nasal steroid spray and change to antihistamines, sudafed if tolerated, f/u if worsening      Return if symptoms worsen or fail to improve.   Delsa Grana, PA-C 04/17/19 1:39 PM

## 2019-04-18 LAB — URINE CULTURE
MICRO NUMBER:: 923948
Result:: NO GROWTH
SPECIMEN QUALITY:: ADEQUATE

## 2019-04-20 ENCOUNTER — Other Ambulatory Visit: Payer: Self-pay | Admitting: Family Medicine

## 2019-04-20 MED ORDER — OXYBUTYNIN CHLORIDE ER 5 MG PO TB24: ORAL_TABLET | ORAL | 1 refills | Status: DC

## 2019-05-21 ENCOUNTER — Ambulatory Visit: Payer: 59 | Admitting: Family Medicine

## 2019-07-28 IMAGING — CR DG CHEST 2V
1 series · 2 of 2 positions shown · non-contrast
Comparison: 12/30/2013

CLINICAL DATA: Dyspnea all day

EXAM:
CHEST - 2 VIEW

[Series 1: dg chest 2 view · 0.14mm/px · 2 of 2 slices shown]
[im 1/2]
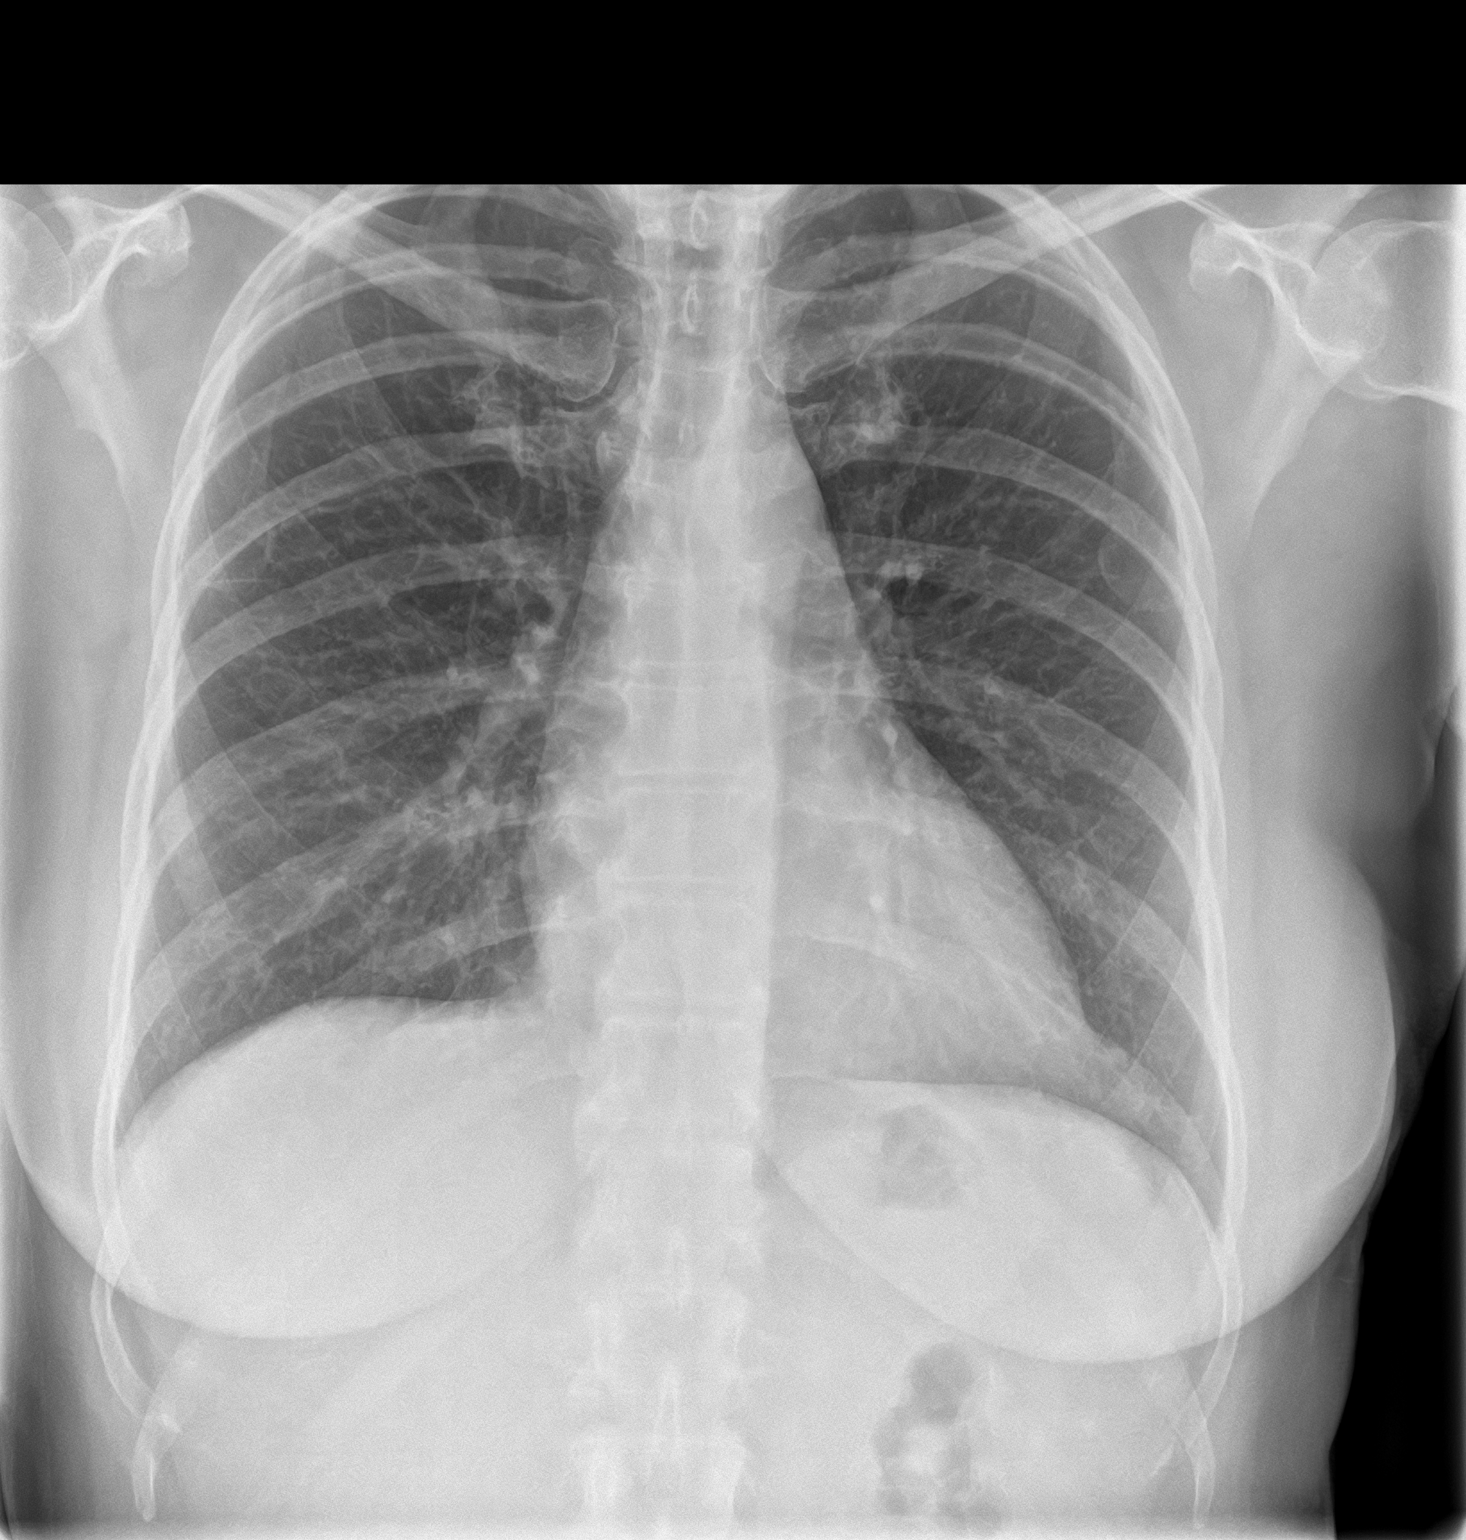
[im 2/2]
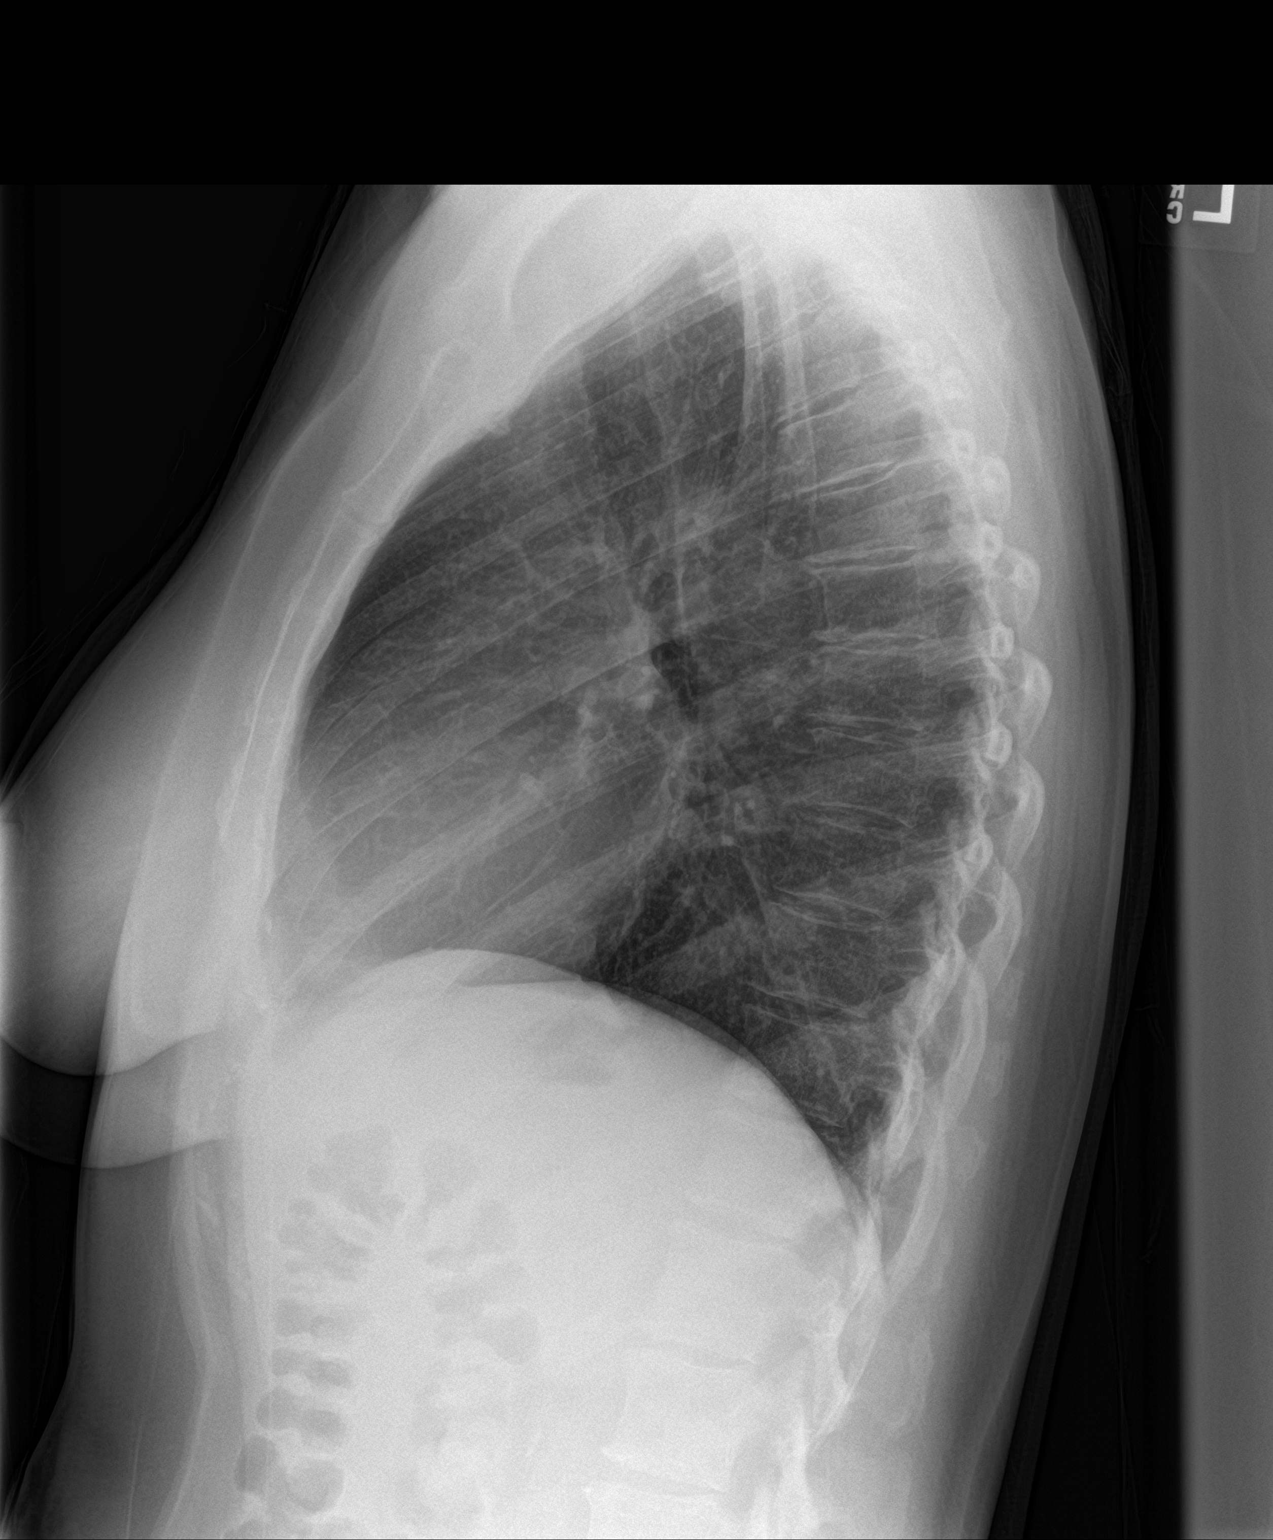

[2 of 2 positions shown; findings below may reference images not displayed]

FINDINGS: The heart size and mediastinal contours are within normal limits.
Both lungs are clear. The visualized skeletal structures are
unremarkable.
IMPRESSION: No active cardiopulmonary disease.

## 2019-09-10 IMAGING — MR MR CERVICAL SPINE W/O CM
4 of 5 series · 27 of 48 positions shown · non-contrast
Comparison: Cervical spine radiograph 02/10/2016

CLINICAL DATA: Chronic worsening neck pain and left shoulder pain.
No known injury.

EXAM:
MRI CERVICAL SPINE WITHOUT CONTRAST
TECHNIQUE: Multiplanar, multisequence MR imaging of the cervical spine was
performed. No intravenous contrast was administered.

[Series 6: T2 · sagittal · 3.0mm · 0.55mm/px · 6 of 15 slices shown (1 of 2)]
[im 1/15]
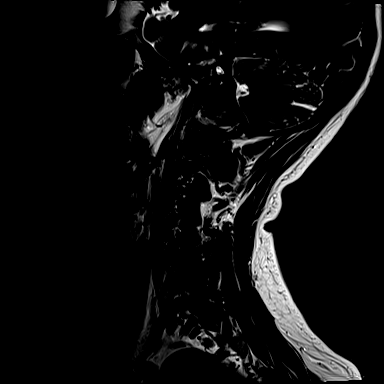
[im 3/15]
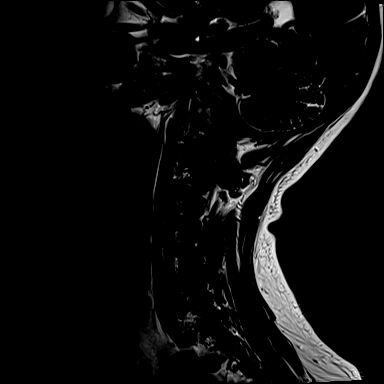
[im 6/15]
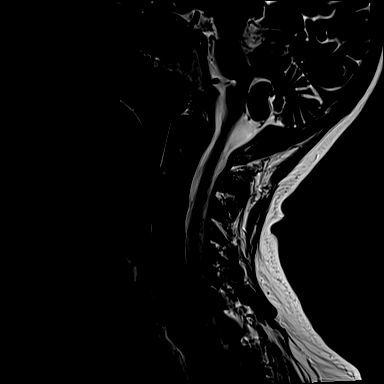
[im 9/15]
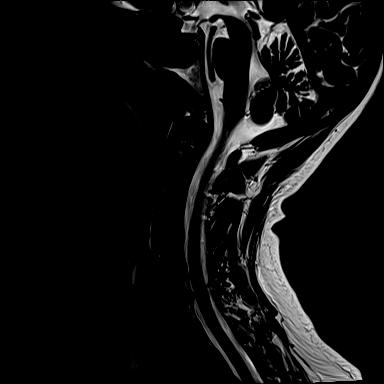
[im 12/15]
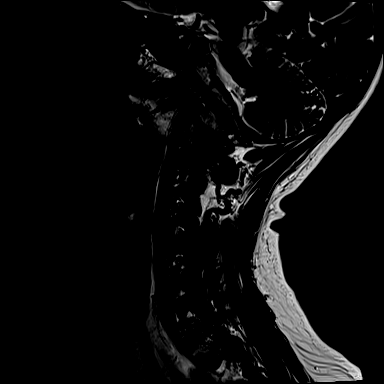
[im 15/15]
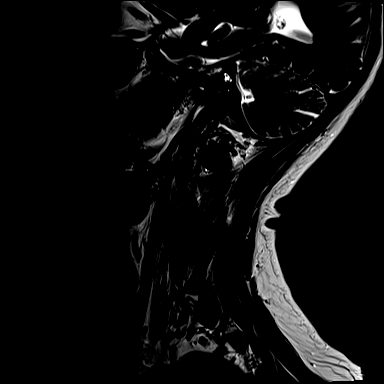

[Series 8: STIR · sagittal · 3.0mm · 0.33mm/px · 6 of 15 slices shown]
[im 1/15]
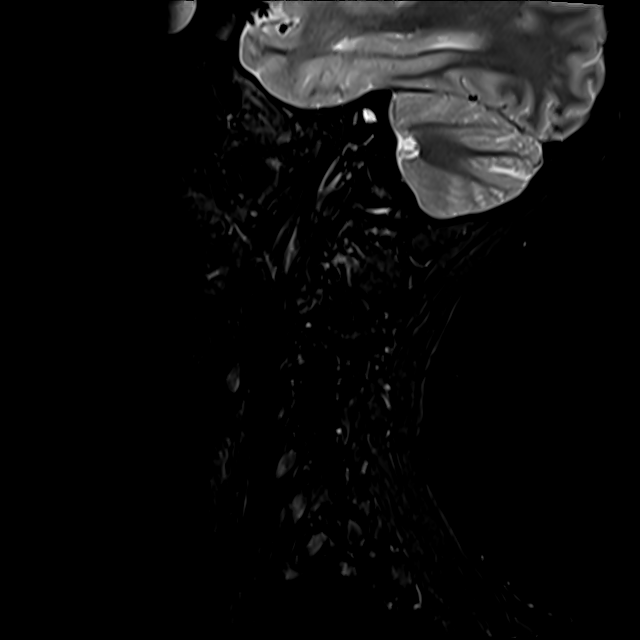
[im 3/15]
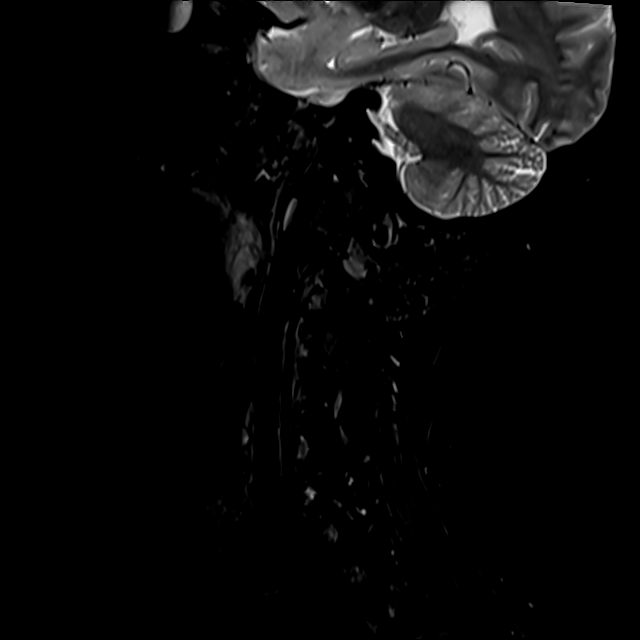
[im 5/15]
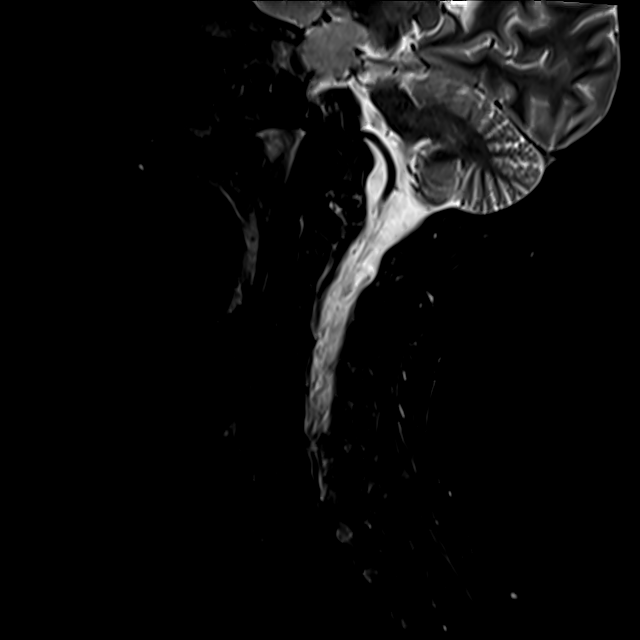
[im 8/15]
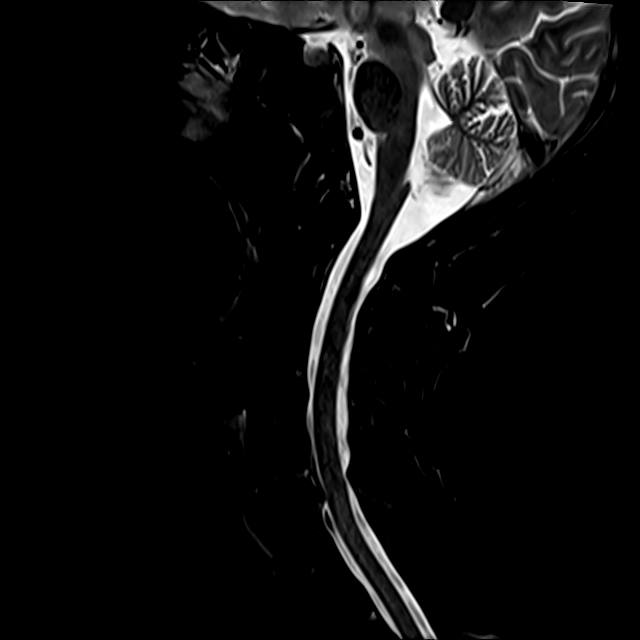
[im 10/15]
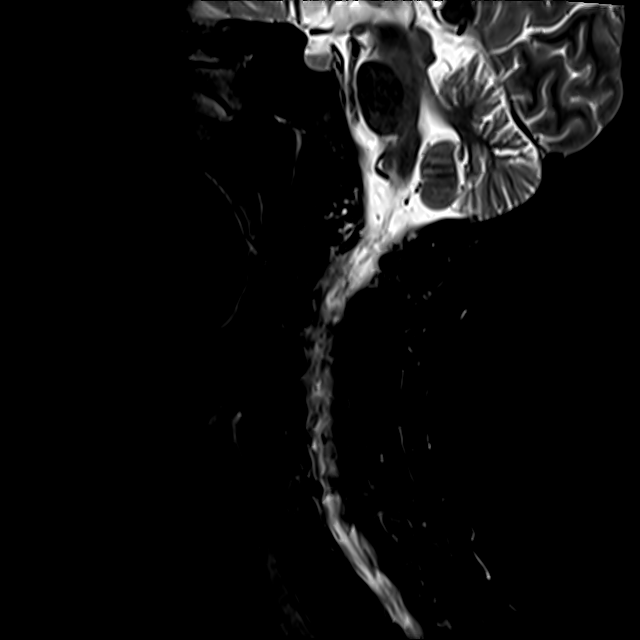
[im 12/15]
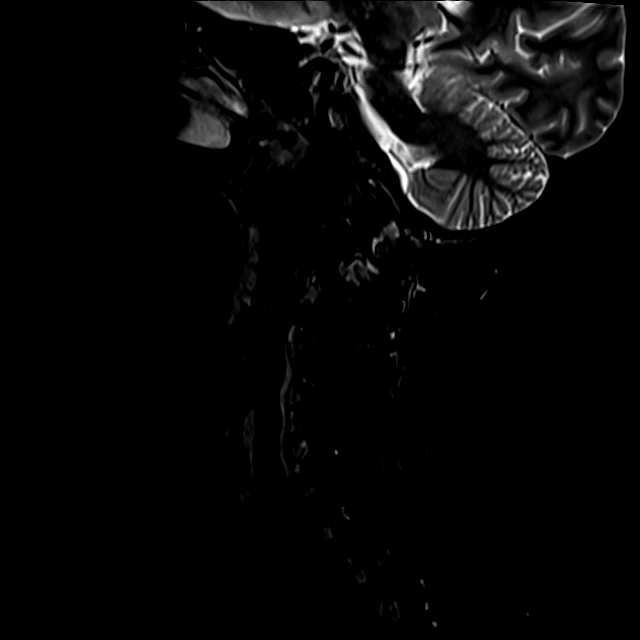

[Series 9: T1 · sagittal · 3.0mm · 0.66mm/px · 7 of 15 slices shown]
[im 1/15]
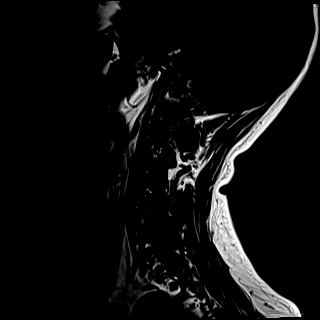
[im 3/15]
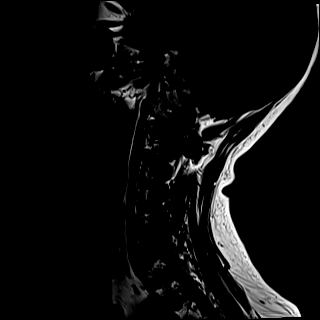
[im 5/15]
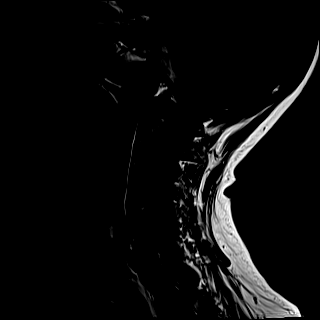
[im 8/15]
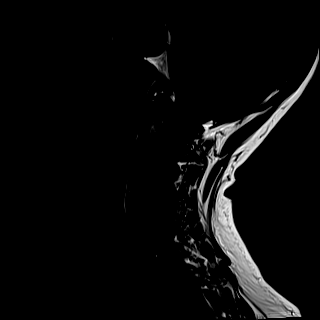
[im 10/15]
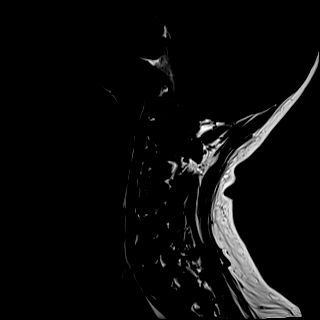
[im 12/15]
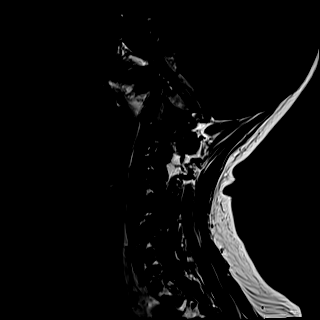
[im 15/15]
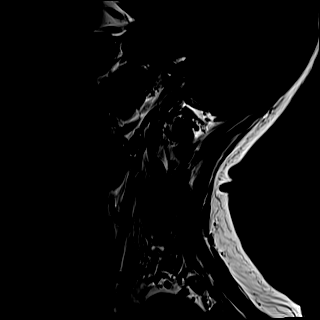

[Series 10: T2 · axial · 3.0mm · 0.50mm/px · z∈[-112,-16]mm · 8 of 31 slices shown (2 of 2)]
[im 1/31]
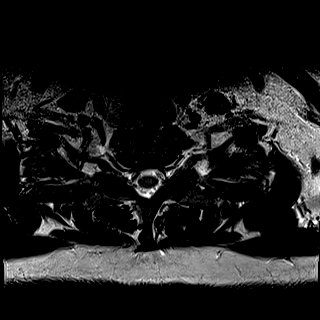
[im 5/31]
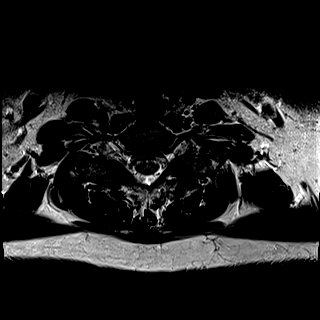
[im 10/31]
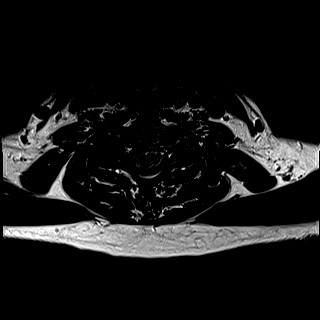
[im 14/31]
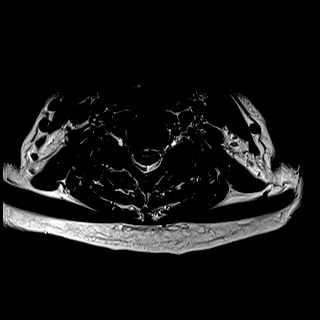
[im 17/31]
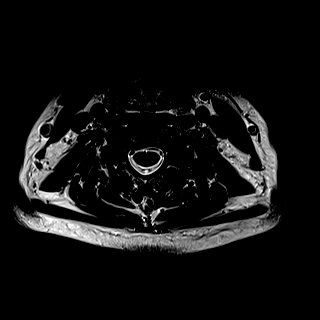
[im 21/31]
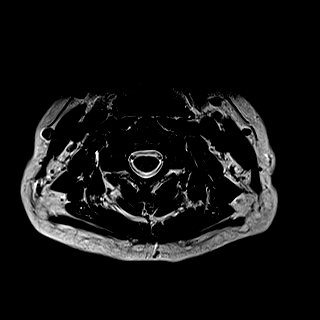
[im 26/31]
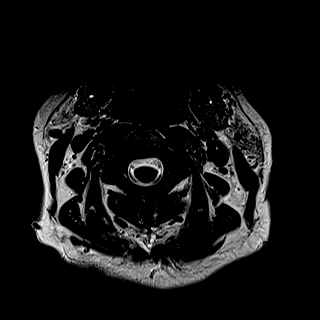
[im 31/31]
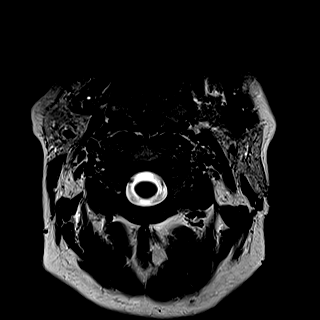

[27 of 48 positions shown; findings below may reference images not displayed]

FINDINGS: Alignment: Normal

Vertebrae: Normal marrow signal.  No bone lesions or fracture.

Cord: Normal signal intensity.  No cord lesions or syrinx.

Posterior Fossa, vertebral arteries, paraspinal tissues: Mild C1-2
degenerative changes with early pannus formation but no mass effect
on the upper cervical cord.

Disc levels:

C2-3: No significant findings.

C3-4: Mild right-sided uncinate spurring changes with mild right
foraminal encroachment. No spinal stenosis.

C4-5: No disc protrusions, spinal or foraminal stenosis.

C5-6: Mild uncinate spurring changes with mild foraminal
encroachment bilaterally, left slightly greater than right. No
significant spinal or foraminal stenosis.

C6-7: Degenerate disc disease with mild disc space narrowing,
osteophytic ridging and uncinate spurring. There is also a focal
central disc protrusion. There is mild flattening of the ventral
thecal sac and narrowing the ventral CSF space. No significant
foraminal stenosis. Mild foraminal encroachment bilaterally.

C7-T1: No significant findings.
IMPRESSION: 1. Normal alignment and no acute bony findings and normal appearance
of the cervical spinal cord.
2. Mild C1-2 degenerative changes with early pannus formation.
3. Moderate degenerate disease at C6-7 with osteophytic ridging,
uncinate spurring and a shallow central disc protrusion. Mild
bilateral foraminal encroachment.
4. Mild right-sided uncinate spurring changes at C3-4 with mild
right foraminal encroachment.
5. Mild uncinate spurring changes bilaterally at C5-6, left slightly
greater than right with mild foraminal encroachment.

## 2019-10-19 ENCOUNTER — Ambulatory Visit: Payer: 59

## 2019-10-19 ENCOUNTER — Other Ambulatory Visit: Payer: Self-pay

## 2019-10-19 ENCOUNTER — Ambulatory Visit: Payer: 59 | Attending: Internal Medicine

## 2019-10-19 DIAGNOSIS — Z23 Encounter for immunization: Secondary | ICD-10-CM

## 2019-10-19 NOTE — Progress Notes (Signed)
   Covid-19 Vaccination Clinic  Name:  [name]    MRN: [id number] DOB: 08/28/1960  10/19/2019  Ms. [name] was observed post Covid-19 immunization for 15 minutes without incident. She was provided with Vaccine Information Sheet and instruction to access the V-Safe system.   Ms. [name] was instructed to call 911 with any severe reactions post vaccine: Marland Kitchen Difficulty breathing  . Swelling of face and throat  . A fast heartbeat  . A bad rash all over body  . Dizziness and weakness   Immunizations Administered    Name Date Dose VIS Date Route   Pfizer COVID-19 Vaccine 10/19/2019  2:35 PM 0.3 mL 07/03/2019 Intramuscular   Manufacturer: [place]   Lot: [id number]   [place]: [id number]

## 2019-11-17 ENCOUNTER — Ambulatory Visit: Payer: 59 | Attending: Internal Medicine

## 2019-11-17 DIAGNOSIS — Z23 Encounter for immunization: Secondary | ICD-10-CM

## 2019-11-17 NOTE — Progress Notes (Signed)
   Covid-19 Vaccination Clinic  Name:  Whitney Cooper    MRN: [id number] DOB: 1960-09-15  11/17/2019  [name] was observed post Covid-19 immunization for 15 minutes without incident. She was provided with Vaccine Information Sheet and instruction to access the V-Safe system.   [name] was instructed to call 911 with any severe reactions post vaccine: Marland Kitchen Difficulty breathing  . Swelling of face and throat  . A fast heartbeat  . A bad rash all over body  . Dizziness and weakness   Immunizations Administered    Name Date Dose VIS Date Route   Pfizer COVID-19 Vaccine 11/17/2019  8:15 AM 0.3 mL 09/16/2018 Intramuscular   Manufacturer: [employer], [employer]   Lot: [id number]   [place]: [id number]

## 2019-11-20 ENCOUNTER — Ambulatory Visit: Payer: 59 | Admitting: Urology

## 2019-11-23 NOTE — Progress Notes (Signed)
11/24/19 11:17 AM   Whitney Cooper 12-25-1960 [id number]  Referring provider: Rusty Aus, MD [facility] [place],  [place] [address] Chief Complaint  Patient presents with  . Recurrent UTI    HPI: Whitney Cooper is a 59 y.o. F who presents today for the evaluation and management of rUTIs.   UA at [facility] from 03/05/2019 revealed 10-50 WBC, 10-50 RBC, and moderate bacteria w/ associated culture negative.   Visited PCP on 09/15/19 c/o dysuria and urinary urgency. She finished her 10 day course of Cipro for symptoms of dysuria 2 days prior to visit. UA from visit was negative w/ associated culture negative.   She reports of intermittent rUTIs for years and states of having her real rUTIs treated at [facility]. She reports of witnessing gross hematuria, burning w/ urination, urgency, frequency and spasms when she has UTI infections. Manages symptoms w/ Azo and antibiotics.   She also states of mild incontinence and use of probiotics. PVR 0 mL.   She reports of increased pressure in her suprapubic area and increased frequency to have bowel movements for last couple of months since ileostomy removal. She also reports of lower bank pain w/ no radiating factor. She does not know if she has vaginal or bladder bulging. Her pressure is relieved w/ standing up.   She has a past hx of kidney stones, remote. Her mother had surgery for kidney stones in past. Most recent crossectional imaging in the form on CT scan abd/ pelvis with contrast 2020 without stones or GU pathology.    Cysto 2016 per patient for recurrent UTI negative.     She had a hysterectomy w/ ovaries removed in 2012 for fibroid and family history of ovarian cancer (no personal history).   +Urine culture 10/26/19 at [facility] indicative of pan sensitive Klebsiella and Pneumonia w/ UA on dip revealing large blood and moderate leukocytes   PMH: Past Medical History:  Diagnosis Date  .  Beat, premature ventricular 06/21/2015  . Bundle branch block    palpitations a few weeks ago  . DDD (degenerative disc disease), cervical   . Diverticulitis of colon 07/07/2009  . Dry eyes   . Essential (primary) hypertension 06/21/2015  . Fibromyalgia   . GERD (gastroesophageal reflux disease)   . Headache    migraine  . History of chicken pox   . History of kidney stones   . Hyperlipidemia   . Hypertension   . IFG (impaired fasting glucose) 01/24/2015  . Lumbar herniated disc   . OAB (overactive bladder)   . Osteoarthritis   . Osteopenia determined by x-ray 02/10/2016  . Recurrent UTI   . Urinary incontinence   . Vertigo     Surgical History: Past Surgical History:  Procedure Laterality Date  . COLONOSCOPY WITH PROPOFOL N/A 11/11/2015   Procedure: COLONOSCOPY WITH PROPOFOL;  Surgeon: Lollie Sails, MD;  Location[facility] ENDOSCOPY;  Service: Endoscopy;  Laterality: N/A;  . COLONOSCOPY WITH PROPOFOL N/A 06/24/2018   Procedure: COLONOSCOPY WITH PROPOFOL;  Surgeon: Lollie Sails, MD;  Location: [facility] ENDOSCOPY;  Service: Endoscopy;  Laterality: N/A;  . ESOPHAGOGASTRODUODENOSCOPY  11/13/2013  . ILEOSTOMY N/A 08/03/2018   Procedure: ILEOSTOMY;  Surgeon: Jules Husbands, MD;  Location: [facility] ORS;  Service: General;  Laterality: N/A;  . ILEOSTOMY CLOSURE N/A 12/30/2018   Procedure: ILEOSTOMY TAKEDOWN;  Surgeon: Jules Husbands, MD;  Location: [facility] ORS;  Service: General;  Laterality: N/A;  . LAPAROSCOPIC RIGHT  COLECTOMY N/A 07/29/2018   Procedure: LAPAROSCOPIC SIGMOID COLECTOMY;  Surgeon: Jules Husbands, MD;  Location: [facility] ORS;  Service: General;  Laterality: N/A;  . LAPAROTOMY N/A 08/03/2018   Procedure: EXPLORATORY LAPAROTOMY;  Surgeon: Jules Husbands, MD;  Location: [facility] ORS;  Service: General;  Laterality: N/A;  . OOPHORECTOMY    . TOTAL ABDOMINAL HYSTERECTOMY  Jan 2012   Complete, due to heavy bleeding and grape-fruit size fibroid     Home Medications:  Allergies as of  11/24/2019      Reactions   Baclofen Other (See Comments)   Dizziness   Codeine Other (See Comments)   jittery   Hydrocodone Other (See Comments)   Headache,dizziness   Hydroxyzine    Made her very nervous   Sulfa Antibiotics Rash      Medication List       Accurate as of Nov 24, 2019 11:17 AM. If you have any questions, ask your nurse or doctor.        STOP taking these medications   oxybutynin 5 MG 24 hr tablet Commonly known as: Ditropan XL Stopped by: Hollice Espy, MD     TAKE these medications   acetaminophen 500 MG tablet Commonly known as: TYLENOL Take 1,000 mg by mouth 2 (two) times a day.   antiseptic oral rinse Liqd 15 mLs by Mouth Rinse route as needed for dry mouth.   atorvastatin 20 MG tablet Commonly known as: LIPITOR Take 1 tablet (20 mg total) by mouth every other day. In the evening.   Calcium Carbonate-Vitamin D 600-200 MG-UNIT Tabs Take by mouth.   Co Q-10 100 MG Caps Take 100 mg by mouth daily.   diazepam 2 MG tablet Commonly known as: Valium Take 1 tablet (2 mg total) by mouth every 12 (twelve) hours as needed for anxiety.   esomeprazole 40 MG capsule Commonly known as: NEXIUM Take 40 mg by mouth daily before breakfast. Empty contents in applesauce and take daily   estradiol 0.1 MG/GM vaginal cream Commonly known as: ESTRACE Apply pea size to area Monday, Wed, Friday at bedtime Started by: Hollice Espy, MD   Fish Oil 1000 MG Caps Take 1,000 mg by mouth daily.   fluticasone 50 MCG/ACT nasal spray Commonly known as: FLONASE Place 2 sprays into both nostrils daily.   levocetirizine 5 MG tablet Commonly known as: Xyzal Take 1 tablet (5 mg total) by mouth every evening.   loperamide 1 MG/5ML solution Commonly known as: IMODIUM Take 1 mg by mouth 3 (three) times daily as needed for diarrhea or loose stools.   meclizine 25 MG tablet Commonly known as: ANTIVERT Take 25 mg by mouth 3 (three) times daily as needed for dizziness.     metoprolol succinate 25 MG 24 hr tablet Commonly known as: TOPROL-XL Take 1 tablet (25 mg total) by mouth daily.   multivitamin with minerals Tabs tablet Take 1 tablet by mouth daily with lunch. Centrum Silver Chewable   Simply Saline 0.9 % Aers Generic drug: Saline Place 1 spray into both nostrils daily as needed (dry nasal passages).   SUMAtriptan 100 MG tablet Commonly known as: IMITREX Take 100 mg by mouth every 2 (two) hours as needed for migraine.   Systane Balance 0.6 % Soln Generic drug: Propylene Glycol Place 1 drop into both eyes 2 (two) times daily as needed (dry/irritated eyes.).   vitamin C 250 MG tablet Commonly known as: ASCORBIC ACID Take 250 mg by mouth daily.   Vitamin D3 25 MCG (  1000 UT) Caps Take 1,000 Units by mouth daily.       Allergies:  Allergies  Allergen Reactions  . Baclofen Other (See Comments)    Dizziness  . Codeine Other (See Comments)    jittery  . Hydrocodone Other (See Comments)    Headache,dizziness  . Hydroxyzine     Made her very nervous  . Sulfa Antibiotics Rash    Family History: Family History  Problem Relation Age of Onset  . Hypertension Mother   . Cancer Mother        skin  . Heart disease Father        CHF  . Hypertension Father   . Stroke Father        mini strokes  . Cancer Father        skin  . Heart attack Father   . Thyroid disease Father   . Ovarian cancer Maternal Grandmother 66  . Cancer Maternal Grandmother        ovarian/brain  . Heart attack Maternal Grandfather   . Heart attack Paternal Grandfather   . Diabetes Maternal Uncle   . Thyroid disease Sister   . Breast cancer Sister 13  . Kidney disease Paternal Grandmother     Social History:  reports that she has never smoked. She has never used smokeless tobacco. She reports that she does not drink alcohol or use drugs.   Physical Exam: BP 135/85   Pulse 80   Ht 5\' 3"  (1.6 m)   Wt 135 lb (61.2 kg)   BMI 23.91 kg/m   Constitutional:   Alert and oriented, No acute distress. HEENT: [place] AT, moist mucus membranes.  Trachea midline, no masses. Cardiovascular: No clubbing, cyanosis, or edema. Respiratory: Normal respiratory effort, no increased work of breathing. Pelvic: Cystocele Stage 1 w/ no rectocele. Mild bladder prolapse. Atrophic vagina w/ poor estrogen effect. Good apical support.  No rectocele.  Normal external genitalia.  Normal urethra. Skin: No rashes, bruises or suspicious lesions. Neurologic: Grossly intact, no focal deficits, moving all 4 extremities. Psychiatric: Normal mood and affect.  Laboratory Data:  Urinalysis Trace blood on dip, otherwise negative microscopically  Pertinent Imaging: Results for orders placed or performed in visit on 11/24/19  BLADDER SCAN AMB NON-IMAGING  Result Value Ref Range   Scan Result 0    Assessment & Plan:    1. rUTIs Presumed, requested records  Counseled pt on prevention techniques including probiotics, cranberry tablets and estrogen cream (pea sized amount) 3x a week on urethra/ per vagina at night time  Rx of Estrace sent to pharmacy  Advised to return to our office specifically if she has any UTI type symptoms  2. Atrophic vagina/Vaginal dryness  As above   3. History of Kidney stones KUB today   4. Cystocele, stage 1 Minimal on exam, do not suspect this is contributing to symptoms   [facility] [address], [place], [place] [address] [phone]  I, Lucas Mallow, am acting as a scribe for Dr. Hollice Espy,  I have reviewed the above documentation for accuracy and completeness, and I agree with the above.   Hollice Espy, MD  I spent 60 total minutes on the day of the encounter including pre-visit review of the medical record, face-to-face time with the patient, and post visit ordering of labs/imaging/tests.

## 2019-11-24 ENCOUNTER — Encounter: Payer: Self-pay | Admitting: Urology

## 2019-11-24 ENCOUNTER — Ambulatory Visit
Admission: RE | Admit: 2019-11-24 | Discharge: 2019-11-24 | Disposition: A | Discharge status: Home / Self Care | Payer: 59 | Source: Ambulatory Visit | Attending: Urology | Admitting: Urology

## 2019-11-24 ENCOUNTER — Ambulatory Visit: Payer: 59 | Admitting: Urology

## 2019-11-24 ENCOUNTER — Other Ambulatory Visit: Payer: Self-pay

## 2019-11-24 VITALS — BP 135/85 | HR 80 | Ht 63.0 in | Wt 135.0 lb

## 2019-11-24 DIAGNOSIS — N200 Calculus of kidney: Secondary | ICD-10-CM | POA: Insufficient documentation

## 2019-11-24 DIAGNOSIS — N390 Urinary tract infection, site not specified: Secondary | ICD-10-CM | POA: Diagnosis not present

## 2019-11-24 DIAGNOSIS — N952 Postmenopausal atrophic vaginitis: Secondary | ICD-10-CM | POA: Diagnosis not present

## 2019-11-24 DIAGNOSIS — N8111 Cystocele, midline: Secondary | ICD-10-CM

## 2019-11-24 LAB — MICROSCOPIC EXAMINATION: Bacteria, UA: NONE SEEN

## 2019-11-24 LAB — URINALYSIS, COMPLETE
Bilirubin, UA: NEGATIVE
Glucose, UA: NEGATIVE
Ketones, UA: NEGATIVE
Leukocytes,UA: NEGATIVE
Nitrite, UA: NEGATIVE
Protein,UA: NEGATIVE
Specific Gravity, UA: 1.025 (ref 1.005–1.030)
Urobilinogen, Ur: 0.2 mg/dL (ref 0.2–1.0)
pH, UA: 6 (ref 5.0–7.5)

## 2019-11-24 LAB — BLADDER SCAN AMB NON-IMAGING: Scan Result: 0

## 2019-11-24 MED ORDER — ESTRADIOL 0.1 MG/GM VA CREA: TOPICAL_CREAM | VAGINAL | 12 refills | Status: DC

## 2019-11-25 ENCOUNTER — Telehealth: Payer: Self-pay | Admitting: *Deleted

## 2019-11-25 NOTE — Telephone Encounter (Addendum)
Patient informed, voiced understanding.   ----- Message from Hollice Espy, MD sent at 11/24/2019  4:56 PM EDT ----- No stones!  Hollice Espy, MD

## 2019-11-26 ENCOUNTER — Ambulatory Visit: Payer: 59 | Admitting: Urology

## 2020-01-01 ENCOUNTER — Ambulatory Visit: Payer: 59 | Admitting: Physician Assistant

## 2020-01-08 ENCOUNTER — Other Ambulatory Visit: Payer: Self-pay

## 2020-01-08 ENCOUNTER — Ambulatory Visit (INDEPENDENT_AMBULATORY_CARE_PROVIDER_SITE_OTHER): Payer: 59 | Admitting: Physician Assistant

## 2020-01-08 VITALS — BP 140/81 | HR 73 | Wt 135.0 lb

## 2020-01-08 DIAGNOSIS — N3001 Acute cystitis with hematuria: Secondary | ICD-10-CM | POA: Diagnosis not present

## 2020-01-08 NOTE — Progress Notes (Signed)
01/08/2020 12:05 PM   Whitney Cooper 17-Jun-1961 [id number]  CC: Chief Complaint  Patient presents with  . Recurrent UTI   HPI: Whitney Cooper is a 59 y.o. female with PMH rUTI who presents today for UTI with hematuria follow-up.  She saw Dr. Erlene Quan in clinic on 11/24/2019 and was counseled to begin probiotics, cranberry supplements, and topical vaginal estrogen cream at that time.  She saw her PCP on 12/31/2019 with reports of dysuria and urinary frequency.  UA notable for nitrites, >50 WBCs/hpf, >50 WBCs/hpf, and few bacteria. Urine culture ultimately grew ampicillin and Bactrim-resistant E coli; she was treated with Omnicef 300mg  BID x7 days.  Today she reports resolution of dysuria and [place] with some persistent but improved urgency and frequency. She is taking cranberry supplements, Kefir and yogurt, and topical vaginal estrogen cream as prescribed.  In-office UA and microscopy today pan negative.    PMH: Past Medical History:  Diagnosis Date  . Beat, premature ventricular 06/21/2015  . Bundle branch block    palpitations a few weeks ago  . DDD (degenerative disc disease), cervical   . Diverticulitis of colon 07/07/2009  . Dry eyes   . Essential (primary) hypertension 06/21/2015  . Fibromyalgia   . GERD (gastroesophageal reflux disease)   . Headache    migraine  . History of chicken pox   . History of kidney stones   . Hyperlipidemia   . Hypertension   . IFG (impaired fasting glucose) 01/24/2015  . Lumbar herniated disc   . OAB (overactive bladder)   . Osteoarthritis   . Osteopenia determined by x-ray 02/10/2016  . Recurrent UTI   . Urinary incontinence   . Vertigo     Surgical History: Past Surgical History:  Procedure Laterality Date  . COLONOSCOPY WITH PROPOFOL N/A 11/11/2015   Procedure: COLONOSCOPY WITH PROPOFOL;  Surgeon: Lollie Sails, MD;  Location: [facility] ENDOSCOPY;  Service: Endoscopy;  Laterality: N/A;  . COLONOSCOPY WITH PROPOFOL N/A 06/24/2018    Procedure: COLONOSCOPY WITH PROPOFOL;  Surgeon: Lollie Sails, MD;  Location: [facility] ENDOSCOPY;  Service: Endoscopy;  Laterality: N/A;  . ESOPHAGOGASTRODUODENOSCOPY  11/13/2013  . ILEOSTOMY N/A 08/03/2018   Procedure: ILEOSTOMY;  Surgeon: Jules Husbands, MD;  Location: [facility] ORS;  Service: General;  Laterality: N/A;  . ILEOSTOMY CLOSURE N/A 12/30/2018   Procedure: ILEOSTOMY TAKEDOWN;  Surgeon: Jules Husbands, MD;  Location: [facility] ORS;  Service: General;  Laterality: N/A;  . LAPAROSCOPIC RIGHT COLECTOMY N/A 07/29/2018   Procedure: LAPAROSCOPIC SIGMOID COLECTOMY;  Surgeon: Jules Husbands, MD;  Location: [facility] ORS;  Service: General;  Laterality: N/A;  . LAPAROTOMY N/A 08/03/2018   Procedure: EXPLORATORY LAPAROTOMY;  Surgeon: Jules Husbands, MD;  Location: [facility] ORS;  Service: General;  Laterality: N/A;  . OOPHORECTOMY    . TOTAL ABDOMINAL HYSTERECTOMY  Jan 2012   Complete, due to heavy bleeding and grape-fruit size fibroid     Home Medications:  Allergies as of 01/08/2020      Reactions   Baclofen Other (See Comments)   Dizziness   Codeine Other (See Comments)   jittery   Hydrocodone Other (See Comments)   Headache,dizziness   Hydroxyzine    Made her very nervous   Sulfa Antibiotics Rash      Medication List       Accurate as of January 08, 2020 12:05 PM. If you have any questions, ask your nurse or doctor.        STOP taking these medications  loperamide 1 MG/5ML solution Commonly known as: IMODIUM Stopped by: Debroah Loop, PA-C   meclizine 25 MG tablet Commonly known as: ANTIVERT Stopped by: Debroah Loop, PA-C   SUMAtriptan 100 MG tablet Commonly known as: IMITREX Stopped by: Debroah Loop, PA-C     TAKE these medications   acetaminophen 500 MG tablet Commonly known as: TYLENOL Take 1,000 mg by mouth 2 (two) times a day.   antiseptic oral rinse Liqd 15 mLs by Mouth Rinse route as needed for dry mouth.   atorvastatin 20 MG tablet Commonly  known as: LIPITOR Take 1 tablet (20 mg total) by mouth every other day. In the evening.   Calcium Carbonate-Vitamin D 600-200 MG-UNIT Tabs Take by mouth.   Co Q-10 100 MG Caps Take 100 mg by mouth daily.   diazepam 2 MG tablet Commonly known as: Valium Take 1 tablet (2 mg total) by mouth every 12 (twelve) hours as needed for anxiety.   esomeprazole 40 MG capsule Commonly known as: NEXIUM Take 40 mg by mouth daily before breakfast. Empty contents in applesauce and take daily   estradiol 0.1 MG/GM vaginal cream Commonly known as: ESTRACE Apply pea size to area Monday, Wed, Friday at bedtime   Fish Oil 1000 MG Caps Take 1,000 mg by mouth daily.   fluticasone 50 MCG/ACT nasal spray Commonly known as: FLONASE Place 2 sprays into both nostrils daily.   levocetirizine 5 MG tablet Commonly known as: Xyzal Take 1 tablet (5 mg total) by mouth every evening.   metoprolol succinate 25 MG 24 hr tablet Commonly known as: TOPROL-XL Take 1 tablet (25 mg total) by mouth daily.   multivitamin with minerals Tabs tablet Take 1 tablet by mouth daily with lunch. Centrum Silver Chewable   Simply Saline 0.9 % Aers Generic drug: Saline Place 1 spray into both nostrils daily as needed (dry nasal passages).   Systane Balance 0.6 % Soln Generic drug: Propylene Glycol Place 1 drop into both eyes 2 (two) times daily as needed (dry/irritated eyes.).   vitamin C 250 MG tablet Commonly known as: ASCORBIC ACID Take 250 mg by mouth daily.   Vitamin D3 25 MCG (1000 UT) Caps Take 1,000 Units by mouth daily.       Allergies:  Allergies  Allergen Reactions  . Baclofen Other (See Comments)    Dizziness  . Codeine Other (See Comments)    jittery  . Hydrocodone Other (See Comments)    Headache,dizziness  . Hydroxyzine     Made her very nervous  . Sulfa Antibiotics Rash    Family History: Family History  Problem Relation Age of Onset  . Hypertension Mother   . Cancer Mother         skin  . Heart disease Father        CHF  . Hypertension Father   . Stroke Father        mini strokes  . Cancer Father        skin  . Heart attack Father   . Thyroid disease Father   . Ovarian cancer Maternal Grandmother 66  . Cancer Maternal Grandmother        ovarian/brain  . Heart attack Maternal Grandfather   . Heart attack Paternal Grandfather   . Diabetes Maternal Uncle   . Thyroid disease Sister   . Breast cancer Sister 93  . Kidney disease Paternal Grandmother     Social History:   reports that she has never smoked. She has never used smokeless  tobacco. She reports that she does not drink alcohol and does not use drugs.  Physical Exam: BP 140/81   Pulse 73   Wt 135 lb (61.2 kg)   BMI 23.91 kg/m   Constitutional:  Alert and oriented, no acute distress, nontoxic appearing HEENT: [place], AT Cardiovascular: No clubbing, cyanosis, or edema Respiratory: Normal respiratory effort, no increased work of breathing Skin: No rashes, bruises or suspicious lesions Neurologic: Grossly intact, no focal deficits, moving all 4 extremities Psychiatric: Normal mood and affect  Laboratory Data: Results for orders placed or performed in visit on 01/08/20  Microscopic Examination   Urine  Result Value Ref Range   WBC, UA 0-5 0 - 5 /hpf   RBC 0-2 0 - 2 /hpf   Epithelial Cells (non renal) 0-10 0 - 10 /hpf   Renal Epithel, UA 0-10 (A) None seen /hpf   Bacteria, UA None seen None seen/Few  Urinalysis, Complete  Result Value Ref Range   Specific Gravity, UA 1.010 1.005 - 1.030   pH, UA 5.5 5.0 - 7.5   Color, UA Yellow Yellow   Appearance Ur Hazy (A) Clear   Leukocytes,UA Negative Negative   Protein,UA Negative Negative/Trace   Glucose, UA Negative Negative   Ketones, UA Negative Negative   RBC, UA Negative Negative   Bilirubin, UA Negative Negative   Urobilinogen, Ur 0.2 0.2 - 1.0 mg/dL   Nitrite, UA Negative Negative   Microscopic Examination See below:    Assessment & Plan:     1. Acute cystitis with hematuria Recently completed culture appropriate antibiotics, MH resolved on UA today.  Suspect post-infectious bladder inflammation as etiology of lingering urgency and frequency, time-limited. No further intervention indicated.  Encouraged patient to continue probiotics, cranberry supplements, and topical vaginal estrogen cream. - Urinalysis, Complete  Return if symptoms worsen or fail to improve.  Debroah Loop, PA-C  [facility] [address] [place], [place] [address] [phone]

## 2020-01-11 LAB — URINALYSIS, COMPLETE
Bilirubin, UA: NEGATIVE
Glucose, UA: NEGATIVE
Ketones, UA: NEGATIVE
Leukocytes,UA: NEGATIVE
Nitrite, UA: NEGATIVE
Protein,UA: NEGATIVE
RBC, UA: NEGATIVE
Specific Gravity, UA: 1.01 (ref 1.005–1.030)
Urobilinogen, Ur: 0.2 mg/dL (ref 0.2–1.0)
pH, UA: 5.5 (ref 5.0–7.5)

## 2020-01-11 LAB — MICROSCOPIC EXAMINATION: Bacteria, UA: NONE SEEN

## 2020-03-17 ENCOUNTER — Other Ambulatory Visit: Payer: Self-pay | Admitting: Internal Medicine

## 2020-03-17 DIAGNOSIS — Z1231 Encounter for screening mammogram for malignant neoplasm of breast: Secondary | ICD-10-CM

## 2020-04-06 ENCOUNTER — Ambulatory Visit
Admission: RE | Admit: 2020-04-06 | Discharge: 2020-04-06 | Disposition: A | Discharge status: Home / Self Care | Payer: 59 | Source: Ambulatory Visit | Attending: Internal Medicine | Admitting: Internal Medicine

## 2020-04-06 ENCOUNTER — Other Ambulatory Visit: Payer: Self-pay

## 2020-04-06 DIAGNOSIS — Z1231 Encounter for screening mammogram for malignant neoplasm of breast: Secondary | ICD-10-CM | POA: Insufficient documentation

## 2020-05-20 ENCOUNTER — Ambulatory Visit: Payer: 59 | Admitting: Urology

## 2020-05-20 ENCOUNTER — Other Ambulatory Visit: Payer: Self-pay

## 2020-05-20 ENCOUNTER — Encounter: Payer: Self-pay | Admitting: Urology

## 2020-05-20 ENCOUNTER — Telehealth: Payer: Self-pay

## 2020-05-20 VITALS — BP 130/80 | HR 74 | Ht 63.0 in | Wt 140.0 lb

## 2020-05-20 DIAGNOSIS — R350 Frequency of micturition: Secondary | ICD-10-CM

## 2020-05-20 LAB — URINALYSIS, COMPLETE
Bilirubin, UA: NEGATIVE
Glucose, UA: NEGATIVE
Ketones, UA: NEGATIVE
Leukocytes,UA: NEGATIVE
Nitrite, UA: NEGATIVE
Protein,UA: NEGATIVE
RBC, UA: NEGATIVE
Specific Gravity, UA: 1.015 (ref 1.005–1.030)
Urobilinogen, Ur: 0.2 mg/dL (ref 0.2–1.0)
pH, UA: 6 (ref 5.0–7.5)

## 2020-05-20 LAB — MICROSCOPIC EXAMINATION
Bacteria, UA: NONE SEEN
RBC, Urine: NONE SEEN /hpf (ref 0–2)

## 2020-05-20 NOTE — Telephone Encounter (Signed)
Patient called stating she woke up this morning with frequency, urgency and dysuria. She is very uncomfortable and believes she has a UTI. Patient was scheduled today for further evaluation.

## 2020-05-20 NOTE — Progress Notes (Signed)
05/20/2020 2:10 PM   Whitney Cooper 07-17-1961 [id number]  Referring provider: Rusty Aus, MD [facility] [place],  [place] [address]  Chief Complaint  Patient presents with  . Dysuria    HPI:  Dr. Erlene Quan and PA Samantha's notes reviewed.  Patient added urgently today for frequency and urgency. She has dysuria. No constipation but bowel urgency. She has spasms in her stomach. She increased her water. No gross hematuria. Her UA is clear and not c/w infection.   PMH: Past Medical History:  Diagnosis Date  . Beat, premature ventricular 06/21/2015  . Bundle branch block    palpitations a few weeks ago  . DDD (degenerative disc disease), cervical   . Diverticulitis of colon 07/07/2009  . Dry eyes   . Essential (primary) hypertension 06/21/2015  . Fibromyalgia   . GERD (gastroesophageal reflux disease)   . Headache    migraine  . History of chicken pox   . History of kidney stones   . Hyperlipidemia   . Hypertension   . IFG (impaired fasting glucose) 01/24/2015  . Lumbar herniated disc   . OAB (overactive bladder)   . Osteoarthritis   . Osteopenia determined by x-ray 02/10/2016  . Recurrent UTI   . Urinary incontinence   . Vertigo     Surgical History: Past Surgical History:  Procedure Laterality Date  . COLONOSCOPY WITH PROPOFOL N/A 11/11/2015   Procedure: COLONOSCOPY WITH PROPOFOL;  Surgeon: Lollie Sails, MD;  Location: [facility] ENDOSCOPY;  Service: Endoscopy;  Laterality: N/A;  . COLONOSCOPY WITH PROPOFOL N/A 06/24/2018   Procedure: COLONOSCOPY WITH PROPOFOL;  Surgeon: Lollie Sails, MD;  Location: [facility] ENDOSCOPY;  Service: Endoscopy;  Laterality: N/A;  . ESOPHAGOGASTRODUODENOSCOPY  11/13/2013  . ILEOSTOMY N/A 08/03/2018   Procedure: ILEOSTOMY;  Surgeon: Jules Husbands, MD;  Location: [facility] ORS;  Service: General;  Laterality: N/A;  . ILEOSTOMY CLOSURE N/A 12/30/2018   Procedure: ILEOSTOMY TAKEDOWN;  Surgeon: Jules Husbands, MD;  Location: [facility] ORS;  Service: General;  Laterality: N/A;  . LAPAROSCOPIC RIGHT COLECTOMY N/A 07/29/2018   Procedure: LAPAROSCOPIC SIGMOID COLECTOMY;  Surgeon: Jules Husbands, MD;  Location: [facility] ORS;  Service: General;  Laterality: N/A;  . LAPAROTOMY N/A 08/03/2018   Procedure: EXPLORATORY LAPAROTOMY;  Surgeon: Jules Husbands, MD;  Location: [facility] ORS;  Service: General;  Laterality: N/A;  . OOPHORECTOMY    . TOTAL ABDOMINAL HYSTERECTOMY  Jan 2012   Complete, due to heavy bleeding and grape-fruit size fibroid     Home Medications:  Allergies as of 05/20/2020      Reactions   Baclofen Other (See Comments)   Dizziness   Codeine Other (See Comments)   jittery   Hydrocodone Other (See Comments)   Headache,dizziness   Hydroxyzine    Made her very nervous   Sulfa Antibiotics Rash      Medication List       Accurate as of May 20, 2020  2:10 PM. If you have any questions, ask your nurse or doctor.        acetaminophen 500 MG tablet Commonly known as: TYLENOL Take 1,000 mg by mouth 2 (two) times a day.   antiseptic oral rinse Liqd 15 mLs by Mouth Rinse route as needed for dry mouth.   atorvastatin 20 MG tablet Commonly known as: LIPITOR Take 1 tablet (20 mg total) by mouth every other day. In the evening.   Calcium Carbonate-Vitamin D 600-200 MG-UNIT Tabs Take  by mouth.   Co Q-10 100 MG Caps Take 100 mg by mouth daily.   diazepam 2 MG tablet Commonly known as: Valium Take 1 tablet (2 mg total) by mouth every 12 (twelve) hours as needed for anxiety.   esomeprazole 40 MG capsule Commonly known as: NEXIUM Take 40 mg by mouth daily before breakfast. Empty contents in applesauce and take daily   estradiol 0.1 MG/GM vaginal cream Commonly known as: ESTRACE Apply pea size to area Monday, Wed, Friday at bedtime   Fish Oil 1000 MG Caps Take 1,000 mg by mouth daily.   fluticasone 50 MCG/ACT nasal spray Commonly known as: FLONASE Place 2 sprays into both  nostrils daily.   levocetirizine 5 MG tablet Commonly known as: Xyzal Take 1 tablet (5 mg total) by mouth every evening.   metoprolol succinate 25 MG 24 hr tablet Commonly known as: TOPROL-XL Take 1 tablet (25 mg total) by mouth daily.   multivitamin with minerals Tabs tablet Take 1 tablet by mouth daily with lunch. Centrum Silver Chewable   Simply Saline 0.9 % Aers Generic drug: Saline Place 1 spray into both nostrils daily as needed (dry nasal passages).   Systane Balance 0.6 % Soln Generic drug: Propylene Glycol Place 1 drop into both eyes 2 (two) times daily as needed (dry/irritated eyes.).   vitamin C 250 MG tablet Commonly known as: ASCORBIC ACID Take 250 mg by mouth daily.   Vitamin D3 25 MCG (1000 UT) Caps Take 1,000 Units by mouth daily.       Allergies:  Allergies  Allergen Reactions  . Baclofen Other (See Comments)    Dizziness  . Codeine Other (See Comments)    jittery  . Hydrocodone Other (See Comments)    Headache,dizziness  . Hydroxyzine     Made her very nervous  . Sulfa Antibiotics Rash    Family History: Family History  Problem Relation Age of Onset  . Hypertension Mother   . Cancer Mother        skin  . Heart disease Father        CHF  . Hypertension Father   . Stroke Father        mini strokes  . Cancer Father        skin  . Heart attack Father   . Thyroid disease Father   . Ovarian cancer Maternal Grandmother 66  . Cancer Maternal Grandmother        ovarian/brain  . Heart attack Maternal Grandfather   . Heart attack Paternal Grandfather   . Diabetes Maternal Uncle   . Thyroid disease Sister   . Breast cancer Sister 25  . Kidney disease Paternal Grandmother     Social History:  reports that she has never smoked. She has never used smokeless tobacco. She reports that she does not drink alcohol and does not use drugs.   Physical Exam: There were no vitals taken for this visit.  Constitutional:  Alert and oriented, No acute  distress. HEENT:  AT, moist mucus membranes.  Trachea midline, no masses. Cardiovascular: No clubbing, cyanosis, or edema. Respiratory: Normal respiratory effort, no increased work of breathing. GI: Abdomen is soft, nontender, nondistended, no abdominal masses GU: No CVA tenderness Skin: No rashes, bruises or suspicious lesions. Neurologic: Grossly intact, no focal deficits, moving all 4 extremities. Psychiatric: Normal mood and affect.  Laboratory Data: Lab Results  Component Value Date   WBC 8.5 02/18/2019   HGB 12.8 02/18/2019   HCT 38.8 02/18/2019  MCV 87 02/18/2019   PLT 290 02/18/2019    Lab Results  Component Value Date   CREATININE 0.61 02/18/2019    No results found for: PSA  No results found for: TESTOSTERONE  Lab Results  Component Value Date   HGBA1C 5.0 02/18/2019    Urinalysis    Component Value Date/Time   COLORURINE YELLOW 10/01/2018 1139   APPEARANCEUR Hazy (A) 01/08/2020 1126   LABSPEC 1.010 10/01/2018 1139   LABSPEC 1.004 12/30/2013 2325   PHURINE 5.5 10/01/2018 1139   GLUCOSEU Negative 01/08/2020 1126   GLUCOSEU Negative 12/30/2013 2325   HGBUR NEGATIVE 10/01/2018 1139   BILIRUBINUR Negative 01/08/2020 1126   BILIRUBINUR Negative 12/30/2013 2325   KETONESUR NEGATIVE 10/01/2018 1139   PROTEINUR Negative 01/08/2020 1126   PROTEINUR NEGATIVE 10/01/2018 1139   UROBILINOGEN negative (A) 04/17/2019 1359   NITRITE Negative 01/08/2020 1126   NITRITE NEGATIVE 10/01/2018 1139   LEUKOCYTESUR Negative 01/08/2020 1126   LEUKOCYTESUR NEGATIVE 10/01/2018 1139   LEUKOCYTESUR Negative 12/30/2013 2325    Lab Results  Component Value Date   LABMICR See below: 01/08/2020   WBCUA 0-5 01/08/2020   RBCUA None seen 08/05/2015   LABEPIT 0-10 01/08/2020   BACTERIA None seen 01/08/2020    Pertinent Imaging: N/a Results for orders placed during the hospital encounter of 11/24/19  Abdomen 1 view (KUB)  Narrative CLINICAL DATA:  Left flank pain for a  week.  History of stones.  EXAM: ABDOMEN - 1 VIEW  COMPARISON:  August 03, 2018  FINDINGS: No renal or ureteral stones are noted.  No acute abnormalities.  IMPRESSION: No renal or ureteral stones are noted.   Electronically Signed By: Dorise Bullion III M.D On: 11/24/2019 15:32  No results found for this or any previous visit.  No results found for this or any previous visit.  No results found for this or any previous visit.  No results found for this or any previous visit.  No results found for this or any previous visit.  No results found for this or any previous visit.  No results found for this or any previous visit.   Assessment & Plan:    Frequency - given bowel and bladder urgency, I gave her samples of Uribel. See Dr. Erlene Quan in 6-12 weeks or at least May 2022 for yearly.    No follow-ups on file.  Festus Aloe, MD  [facility] [address] [place], [place] [address] [phone]

## 2020-05-29 ENCOUNTER — Other Ambulatory Visit: Payer: Self-pay

## 2020-05-29 ENCOUNTER — Emergency Department: Payer: 59

## 2020-05-29 ENCOUNTER — Encounter: Payer: Self-pay | Admitting: Emergency Medicine

## 2020-05-29 ENCOUNTER — Emergency Department
Admission: EM | Admit: 2020-05-29 | Discharge: 2020-05-29 | Disposition: A | Discharge status: Home / Self Care | Payer: 59 | Attending: Emergency Medicine | Admitting: Emergency Medicine

## 2020-05-29 DIAGNOSIS — Z9049 Acquired absence of other specified parts of digestive tract: Secondary | ICD-10-CM | POA: Insufficient documentation

## 2020-05-29 DIAGNOSIS — I10 Essential (primary) hypertension: Secondary | ICD-10-CM | POA: Insufficient documentation

## 2020-05-29 DIAGNOSIS — K529 Noninfective gastroenteritis and colitis, unspecified: Secondary | ICD-10-CM

## 2020-05-29 DIAGNOSIS — Z79899 Other long term (current) drug therapy: Secondary | ICD-10-CM | POA: Diagnosis not present

## 2020-05-29 DIAGNOSIS — R1031 Right lower quadrant pain: Secondary | ICD-10-CM | POA: Diagnosis present

## 2020-05-29 LAB — CBC
HCT: 42.8 % (ref 36.0–46.0)
Hemoglobin: 14.2 g/dL (ref 12.0–15.0)
MCH: 29.3 pg (ref 26.0–34.0)
MCHC: 33.2 g/dL (ref 30.0–36.0)
MCV: 88.2 fL (ref 80.0–100.0)
Platelets: 329 10*3/uL (ref 150–400)
RBC: 4.85 MIL/uL (ref 3.87–5.11)
RDW: 13.2 % (ref 11.5–15.5)
WBC: 15.4 10*3/uL — ABNORMAL HIGH (ref 4.0–10.5)
nRBC: 0 % (ref 0.0–0.2)

## 2020-05-29 LAB — COMPREHENSIVE METABOLIC PANEL
ALT: 24 U/L (ref 0–44)
AST: 22 U/L (ref 15–41)
Albumin: 4.4 g/dL (ref 3.5–5.0)
Alkaline Phosphatase: 54 U/L (ref 38–126)
Anion gap: 13 (ref 5–15)
BUN: 9 mg/dL (ref 8–23)
CO2: 26 mmol/L (ref 22–32)
Calcium: 9.7 mg/dL (ref 8.9–10.3)
Chloride: 101 mmol/L (ref 98–111)
Creatinine, Ser: 0.73 mg/dL (ref 0.44–1.00)
GFR, Estimated: >60 mL/min (ref >60)
Glucose, Bld: 115 mg/dL — ABNORMAL HIGH (ref 70–99)
Potassium: 4.4 mmol/L (ref 3.5–5.1)
Sodium: 140 mmol/L (ref 135–145)
Total Bilirubin: 0.7 mg/dL (ref 0.3–1.2)
Total Protein: 7.7 g/dL (ref 6.5–8.1)

## 2020-05-29 LAB — URINALYSIS, COMPLETE (UACMP) WITH MICROSCOPIC
Bilirubin Urine: NEGATIVE
Glucose, UA: NEGATIVE mg/dL
Hgb urine dipstick: NEGATIVE
Ketones, ur: NEGATIVE mg/dL
Nitrite: NEGATIVE
Protein, ur: NEGATIVE mg/dL
Specific Gravity, Urine: 1.017 (ref 1.005–1.030)
pH: 5 (ref 5.0–8.0)

## 2020-05-29 LAB — LIPASE, BLOOD: Lipase: 27 U/L (ref 11–51)

## 2020-05-29 MED ORDER — DICYCLOMINE HCL 10 MG PO CAPS: 10.0000 mg | ORAL_CAPSULE | Freq: Four times a day (QID) | ORAL | 0 refills | Status: DC

## 2020-05-29 MED ORDER — FLUCONAZOLE 150 MG PO TABS: 150.0000 mg | ORAL_TABLET | Freq: Once | ORAL | 0 refills | Status: AC

## 2020-05-29 MED ORDER — MORPHINE SULFATE (PF) 4 MG/ML IV SOLN
4.0000 mg | Freq: Once | INTRAVENOUS | Status: AC
  Administered 2020-05-29: 4 mg via INTRAVENOUS
  Filled 2020-05-29: qty 1

## 2020-05-29 MED ORDER — ONDANSETRON 4 MG PO TBDP
ORAL_TABLET | ORAL | Status: AC
  Administered 2020-05-29: 4 mg via ORAL
  Filled 2020-05-29: qty 1

## 2020-05-29 MED ORDER — SODIUM CHLORIDE 0.9 % IV BOLUS
1000.0000 mL | Freq: Once | INTRAVENOUS | Status: AC
  Administered 2020-05-29: 1000 mL via INTRAVENOUS

## 2020-05-29 MED ORDER — IOHEXOL 300 MG/ML  SOLN
100.0000 mL | Freq: Once | INTRAMUSCULAR | Status: AC | PRN
  Administered 2020-05-29: 100 mL via INTRAVENOUS
  Filled 2020-05-29: qty 100

## 2020-05-29 MED ORDER — ONDANSETRON HCL 4 MG/2ML IJ SOLN
4.0000 mg | Freq: Once | INTRAMUSCULAR | Status: AC
  Administered 2020-05-29: 4 mg via INTRAVENOUS
  Filled 2020-05-29: qty 2

## 2020-05-29 MED ORDER — DICYCLOMINE HCL 10 MG PO CAPS
10.0000 mg | ORAL_CAPSULE | Freq: Once | ORAL | Status: AC
  Administered 2020-05-29: 10 mg via ORAL
  Filled 2020-05-29: qty 1

## 2020-05-29 MED ORDER — ONDANSETRON 4 MG PO TBDP: 4.0000 mg | ORAL_TABLET | Freq: Three times a day (TID) | ORAL | 0 refills | Status: DC | PRN

## 2020-05-29 MED ORDER — CIPROFLOXACIN HCL 500 MG PO TABS
500.0000 mg | ORAL_TABLET | Freq: Once | ORAL | Status: AC
  Administered 2020-05-29: 500 mg via ORAL
  Filled 2020-05-29: qty 1

## 2020-05-29 MED ORDER — ONDANSETRON 4 MG PO TBDP: 4.0000 mg | ORAL_TABLET | Freq: Once | ORAL | Status: AC | PRN

## 2020-05-29 MED ORDER — CIPROFLOXACIN HCL 500 MG PO TABS: 500.0000 mg | ORAL_TABLET | Freq: Two times a day (BID) | ORAL | 0 refills | Status: AC

## 2020-05-29 MED ORDER — METRONIDAZOLE 500 MG PO TABS: 500.0000 mg | ORAL_TABLET | Freq: Two times a day (BID) | ORAL | 0 refills | Status: DC

## 2020-05-29 MED ORDER — METRONIDAZOLE 500 MG PO TABS
500.0000 mg | ORAL_TABLET | Freq: Once | ORAL | Status: AC
  Administered 2020-05-29: 500 mg via ORAL
  Filled 2020-05-29: qty 1

## 2020-05-29 NOTE — ED Provider Notes (Signed)
[facility] Emergency Department Provider Note  ____________________________________________  Time seen: Approximately 6:24 PM  I have reviewed the triage vital signs and the nursing notes.   HISTORY  Chief Complaint Abdominal Pain    HPI Whitney Cooper is a 59 y.o. female who presents the emergency department complaining of right lower quadrant abdominal pain, nausea and diarrhea.  Patient states that she has had sharp abdominal pain x2 days.  Has had significant nausea but no emesis.  Patient states that yesterday she had significant diarrhea but no bowel movements today.  Patient with no fevers or chills.  No blood in her stool.  Patient does have a history of GERD, diverticulitis, recurrent UTIs.  Patient also has a history of abdominal surgeries with an ileostomy that was taken down 17 months ago.  Patient has had no ongoing abdominal complaints since her ileostomy was taken down.  Patient did note that she had different food, primarily meats that she not eaten in several years prior to the onset of symptoms.  Initially she thought she had had food poisoning neurovirus but the pain is her primary complaint at this time.  Pain is localized to the right mid and lower abdomen.  No dysuria, polyuria, hematuria.  Patient's remainder of her medical history as described below with no complaints with these chronic medical issues.  Patient took some Pepto-Bismol today for her symptoms which did not seem to alleviate symptoms.  No other medications for this complaint prior to arrival.        Past Medical History:  Diagnosis Date  . Beat, premature ventricular 06/21/2015  . Bundle branch block    palpitations a few weeks ago  . DDD (degenerative disc disease), cervical   . Diverticulitis of colon 07/07/2009  . Dry eyes   . Essential (primary) hypertension 06/21/2015  . Fibromyalgia   . GERD (gastroesophageal reflux disease)   . Headache    migraine  . History of  chicken pox   . History of kidney stones   . Hyperlipidemia   . Hypertension   . IFG (impaired fasting glucose) 01/24/2015  . Lumbar herniated disc   . OAB (overactive bladder)   . Osteoarthritis   . Osteopenia determined by x-ray 02/10/2016  . Recurrent UTI   . Urinary incontinence   . Vertigo     Patient Active Problem List   Diagnosis Date Noted  . S/P closure of ileostomy 12/30/2018  . S/P colectomy 07/29/2018  . Aortic atherosclerosis ([place]) 04/15/2018  . Preventative health care 02/12/2018  . Anxiety 12/13/2017  . Insomnia 04/10/2016  . Upper back pain, chronic 02/10/2016  . Neck pain, chronic 02/10/2016  . Medication monitoring encounter 11/15/2015  . Essential (primary) hypertension 06/21/2015  . Combined fat and carbohydrate induced hyperlipemia 06/21/2015  . Beat, premature ventricular 06/21/2015  . Urinary frequency 03/15/2015  . Strangury 03/15/2015  . Incontinence 03/15/2015  . Atrophic vaginitis 03/15/2015  . Abnormal thyroid function test 01/24/2015  . IFG (impaired fasting glucose) 01/24/2015  . Urinary retention with incomplete bladder emptying 01/19/2015  . Hyperlipidemia   . Osteoarthritis   . GERD (gastroesophageal reflux disease)   . Fibromyalgia   . Diverticulitis of colon 07/07/2009    Past Surgical History:  Procedure Laterality Date  . COLONOSCOPY WITH PROPOFOL N/A 11/11/2015   Procedure: COLONOSCOPY WITH PROPOFOL;  Surgeon: Lollie Sails, MD;  Location: [facility] ENDOSCOPY;  Service: Endoscopy;  Laterality: N/A;  . COLONOSCOPY WITH PROPOFOL N/A 06/24/2018   Procedure: COLONOSCOPY  WITH PROPOFOL;  Surgeon: Lollie Sails, MD;  Location: [facility] ENDOSCOPY;  Service: Endoscopy;  Laterality: N/A;  . ESOPHAGOGASTRODUODENOSCOPY  11/13/2013  . ILEOSTOMY N/A 08/03/2018   Procedure: ILEOSTOMY;  Surgeon: Jules Husbands, MD;  Location: [facility] ORS;  Service: General;  Laterality: N/A;  . ILEOSTOMY CLOSURE N/A 12/30/2018   Procedure: ILEOSTOMY TAKEDOWN;  Surgeon:  Jules Husbands, MD;  Location: [facility] ORS;  Service: General;  Laterality: N/A;  . LAPAROSCOPIC RIGHT COLECTOMY N/A 07/29/2018   Procedure: LAPAROSCOPIC SIGMOID COLECTOMY;  Surgeon: Jules Husbands, MD;  Location: [facility] ORS;  Service: General;  Laterality: N/A;  . LAPAROTOMY N/A 08/03/2018   Procedure: EXPLORATORY LAPAROTOMY;  Surgeon: Jules Husbands, MD;  Location: [facility] ORS;  Service: General;  Laterality: N/A;  . OOPHORECTOMY    . TOTAL ABDOMINAL HYSTERECTOMY  Jan 2012   Complete, due to heavy bleeding and grape-fruit size fibroid     Prior to Admission medications   Medication Sig Start Date End Date Taking? Authorizing Provider  acetaminophen (TYLENOL) 500 MG tablet Take 1,000 mg by mouth 2 (two) times a day.     [provider]  antiseptic oral rinse (BIOTENE) LIQD 15 mLs by Mouth Rinse route as needed for dry mouth.    [provider]  atorvastatin (LIPITOR) 20 MG tablet Take 1 tablet (20 mg total) by mouth every other day. In the evening. 01/12/19   Poulose, Bethel Born, NP  Calcium Carbonate-Vitamin D 600-200 MG-UNIT TABS Take by mouth.    [provider]  Cholecalciferol (VITAMIN D3) 1000 units CAPS Take 1,000 Units by mouth daily.  01/20/16   Arnetha Courser, MD  ciprofloxacin (CIPRO) 500 MG tablet Take 1 tablet (500 mg total) by mouth 2 (two) times daily for 7 days. 05/29/20 06/05/20  Pryor Guettler, Charline Bills, PA-C  Coenzyme Q10 (CO Q-10) 100 MG CAPS Take 100 mg by mouth daily.     [provider]  diazepam (VALIUM) 2 MG tablet Take 1 tablet (2 mg total) by mouth every 12 (twelve) hours as needed for anxiety. 02/18/19   Poulose, Bethel Born, NP  dicyclomine (BENTYL) 10 MG capsule Take 1 capsule (10 mg total) by mouth 4 (four) times daily for 14 days. 05/29/20 06/12/20  Inioluwa Boulay, Charline Bills, PA-C  esomeprazole (NEXIUM) 40 MG capsule Take 40 mg by mouth daily before breakfast. Empty contents in applesauce and take daily 06/13/18   [provider]   estradiol (ESTRACE) 0.1 MG/GM vaginal cream Apply pea size to area Monday, Wed, Friday at bedtime 11/24/19   Hollice Espy, MD  fluconazole (DIFLUCAN) 150 MG tablet Take 1 tablet (150 mg total) by mouth once for 1 dose. Take after antibiotics if you are having symptoms of yeast infection 05/29/20 05/29/20  Jimmey Hengel, Charline Bills, PA-C  fluticasone (FLONASE) 50 MCG/ACT nasal spray Place 2 sprays into both nostrils daily. 04/17/19   Delsa Grana, PA-C  levocetirizine (XYZAL) 5 MG tablet Take 1 tablet (5 mg total) by mouth every evening. 04/17/19   Delsa Grana, PA-C  metoprolol succinate (TOPROL-XL) 25 MG 24 hr tablet Take 1 tablet (25 mg total) by mouth daily. 10/01/18   Lada, Satira Anis, MD  metroNIDAZOLE (FLAGYL) 500 MG tablet Take 1 tablet (500 mg total) by mouth 2 (two) times daily. 05/29/20   Lenka Zhao, Charline Bills, PA-C  Multiple Vitamin (MULTIVITAMIN WITH MINERALS) TABS tablet Take 1 tablet by mouth daily with lunch. Centrum Silver Chewable    [provider]  Omega-3 Fatty Acids ([place])  1000 MG CAPS Take 1,000 mg by mouth daily.     [provider]  ondansetron (ZOFRAN-ODT) 4 MG disintegrating tablet Take 1 tablet (4 mg total) by mouth every 8 (eight) hours as needed for nausea or vomiting. 05/29/20   Kianni Lheureux, Charline Bills, PA-C  Saline (SIMPLY SALINE) 0.9 % AERS Place 1 spray into both nostrils daily as needed (dry nasal passages).    [provider]  SYSTANE BALANCE 0.6 % SOLN Place 1 drop into both eyes 2 (two) times daily as needed (dry/irritated eyes.).    [provider]  vitamin C (ASCORBIC ACID) 250 MG tablet Take 250 mg by mouth daily.    [provider]    Allergies Baclofen, Codeine, Hydrocodone, Hydroxyzine, and Sulfa antibiotics  Family History  Problem Relation Age of Onset  . Hypertension Mother   . Cancer Mother        skin  . Heart disease Father        CHF  . Hypertension Father   . Stroke Father        mini strokes  . Cancer  Father        skin  . Heart attack Father   . Thyroid disease Father   . Ovarian cancer Maternal Grandmother 66  . Cancer Maternal Grandmother        ovarian/brain  . Heart attack Maternal Grandfather   . Heart attack Paternal Grandfather   . Diabetes Maternal Uncle   . Thyroid disease Sister   . Breast cancer Sister 28  . Kidney disease Paternal Grandmother     Social History Social History   Tobacco Use  . Smoking status: Never Smoker  . Smokeless tobacco: Never Used  Vaping Use  . Vaping Use: Never used  Substance Use Topics  . Alcohol use: No  . Drug use: Never     Review of Systems  Constitutional: No fever/chills Eyes: No visual changes. No discharge ENT: No upper respiratory complaints. Cardiovascular: no chest pain. Respiratory: no cough. No SOB. Gastrointestinal: Right mid and lower quadrant abdominal pain.Marland Kitchen  Positive nausea, no vomiting.  Significant diarrhea yesterday, no diarrhea or bowel movements today..  No constipation. Genitourinary: Negative for dysuria. No hematuria Musculoskeletal: Negative for musculoskeletal pain. Skin: Negative for rash, abrasions, lacerations, ecchymosis. Neurological: Negative for headaches, focal weakness or numbness.  10 System ROS otherwise negative.  ____________________________________________   PHYSICAL EXAM:  VITAL SIGNS: ED Triage Vitals  Enc Vitals Group     BP 05/29/20 1641 (!) 158/98     Pulse Rate 05/29/20 1641 (!) 101     Resp 05/29/20 1641 20     Temp 05/29/20 1641 99.1 F (37.3 C)     Temp Source 05/29/20 1641 Oral     SpO2 05/29/20 1641 99 %     Weight 05/29/20 1642 143 lb (64.9 kg)     Height 05/29/20 1642 5\' 3"  (1.6 m)     Head Circumference --      Peak Flow --      Pain Score 05/29/20 1642 7     Pain Loc --      Pain Edu? --      Excl. in [place]? --      Constitutional: Alert and oriented. Well appearing and in no acute distress. Eyes: Conjunctivae are normal. PERRL. EOMI. Head:  Atraumatic. ENT:      Ears:       Nose: No congestion/rhinnorhea.      Mouth/Throat: Mucous membranes are moist.  Neck: No stridor.    Cardiovascular: Normal rate, regular rhythm. Normal S1 and S2.  Good peripheral circulation. Respiratory: Normal respiratory effort without tachypnea or retractions. Lungs CTAB. Good air entry to the bases with no decreased or absent breath sounds. Gastrointestinal: No visible external abdominal wall findings.  Bowel sounds 4 quadrants. Soft and nontender to palpation to the left upper and lower quadrants.  Patient is soft to palpation right quadrant but is very tender to palpation in the right lower and mid abdominal wall.  No right upper quadrant tenderness.  Negative for Murphy sign.  No Rovsing's or obturator's.  No rebound tenderness.. No guarding or rigidity. No palpable masses. No distention. No CVA tenderness. Musculoskeletal: Full range of motion to all extremities. No gross deformities appreciated. Neurologic:  Normal speech and language. No gross focal neurologic deficits are appreciated.  Skin:  Skin is warm, dry and intact. No rash noted. Psychiatric: Mood and affect are normal. Speech and behavior are normal. Patient exhibits appropriate insight and judgement.   ____________________________________________   LABS (all labs ordered are listed, but only abnormal results are displayed)  Labs Reviewed  COMPREHENSIVE METABOLIC PANEL - Abnormal; Notable for the following components:      Result Value   Glucose, Bld 115 (*)    All other components within normal limits  CBC - Abnormal; Notable for the following components:   WBC 15.4 (*)    All other components within normal limits  URINALYSIS, COMPLETE (UACMP) WITH MICROSCOPIC - Abnormal; Notable for the following components:   Color, Urine YELLOW (*)    APPearance HAZY (*)    Leukocytes,Ua MODERATE (*)    Bacteria, UA RARE (*)    All other components within normal limits  LIPASE, BLOOD    ____________________________________________  EKG   ____________________________________________  RADIOLOGY I personally viewed and evaluated these images as part of my medical decision making, as well as reviewing the written report by the radiologist.  ED Provider Interpretation: Findings consistent with enteritis on CT scan.  CT ABDOMEN PELVIS W CONTRAST  Result Date: 05/29/2020 CLINICAL DATA:  Right lower quadrant abdominal pain, nausea and diarrhea EXAM: CT ABDOMEN AND PELVIS WITH CONTRAST TECHNIQUE: Multidetector CT imaging of the abdomen and pelvis was performed using the standard protocol following bolus administration of intravenous contrast. CONTRAST:  135mL OMNIPAQUE IOHEXOL 300 MG/ML  SOLN COMPARISON:  07/18/2018 FINDINGS: Lower chest: No acute pleural or parenchymal lung disease. Hepatobiliary: No focal liver abnormality is seen. No gallstones, gallbladder wall thickening, or biliary dilatation. Pancreas: Unremarkable. No pancreatic ductal dilatation or surrounding inflammatory changes. Spleen: Normal in size without focal abnormality. Adrenals/Urinary Tract: Adrenal glands are unremarkable. Kidneys are normal, without renal calculi, focal lesion, or hydronephrosis. Bladder is unremarkable. Stomach/Bowel: Postsurgical changes are seen from previous bowel resection and reanastomosis. There is segmental dilatation of the jejunum within the right lower quadrant, measuring up to 2.7 cm in diameter. There is adjacent mesenteric fat stranding. Findings are consistent with segmental enteritis, either inflammatory or infectious, and associated ileus. No evidence of high-grade obstruction, as there is gas and stool throughout the colon. Vascular/Lymphatic: Aortic atherosclerosis. No enlarged abdominal or pelvic lymph nodes. Reproductive: Status post hysterectomy. No adnexal masses. Other: Small amount of free fluid within the right pericolic gutter, right lower quadrant, and pelvis. No free  intraperitoneal gas. No abdominal wall hernia. Musculoskeletal: No acute or destructive bony lesions. Reconstructed images demonstrate no additional findings. IMPRESSION: 1. Segmental dilatation of the jejunum in the right lower quadrant just proximal  to an enterotomy staple line, with associated mesenteric inflammatory changes and free fluid. Findings are consistent with inflammatory or infectious enteritis, with associated ileus. No evidence of high-grade obstruction. Electronically Signed   By: Randa Ngo M.D.   On: 05/29/2020 20:23    ____________________________________________    PROCEDURES  Procedure(s) performed:    Procedures    Medications  metroNIDAZOLE (FLAGYL) tablet 500 mg (has no administration in time range)  ciprofloxacin (CIPRO) tablet 500 mg (has no administration in time range)  dicyclomine (BENTYL) capsule 10 mg (has no administration in time range)  ondansetron (ZOFRAN-ODT) disintegrating tablet 4 mg (4 mg Oral Given 05/29/20 1648)  sodium chloride 0.9 % bolus 1,000 mL (1,000 mLs Intravenous New Bag/Given 05/29/20 1850)  iohexol (OMNIPAQUE) 300 MG/ML solution 100 mL (100 mLs Intravenous Contrast Given 05/29/20 2000)  morphine 4 MG/ML injection 4 mg (4 mg Intravenous Given 05/29/20 2016)  ondansetron (ZOFRAN) injection 4 mg (4 mg Intravenous Given 05/29/20 2015)     ____________________________________________   INITIAL IMPRESSION / ASSESSMENT AND PLAN / ED COURSE  Pertinent labs & imaging results that were available during my care of the patient were reviewed by me and considered in my medical decision making (see chart for details).  Review of the [place] CSRS was performed in accordance of the [place] prior to dispensing any controlled drugs.           Patient's diagnosis is consistent with enteritis.  Patient presented to the emergency department with right mid and lower quadrant abdominal pain, nausea, diarrhea.  Patient was afebrile.  Labs were overall  reassuring.  Given the pain location, history of previous surgeries, slightly elevated white blood cell count, imaging was ordered.  Differential included appendicitis, cholecystitis, enteritis, colitis, small bowel obstruction, bowel perforation.  Patient had findings consistent with enteritis on CT scan.  I will cover the patient with antibiotics for bacterial coverage, this is likely viral in nature.  Patient is to take a probiotic while on her antibiotics.  Patient will also be prescribed Bentyl and Zofran for additional symptom control.  Return precautions are discussed at length with the patient and her husband.  Follow-up with primary care as needed..  Patient is given ED precautions to return to the ED for any worsening or new symptoms.     ____________________________________________  FINAL CLINICAL IMPRESSION(S) / ED DIAGNOSES  Final diagnoses:  Enteritis      NEW MEDICATIONS STARTED DURING THIS VISIT:  ED Discharge Orders         Ordered    ciprofloxacin (CIPRO) 500 MG tablet  2 times daily        05/29/20 2055    metroNIDAZOLE (FLAGYL) 500 MG tablet  2 times daily        05/29/20 2055    dicyclomine (BENTYL) 10 MG capsule  4 times daily        05/29/20 2055    ondansetron (ZOFRAN-ODT) 4 MG disintegrating tablet  Every 8 hours PRN        05/29/20 2055    fluconazole (DIFLUCAN) 150 MG tablet   Once        05/29/20 2055              This chart was dictated using voice recognition software/Dragon. Despite best efforts to proofread, errors can occur which can change the meaning. Any change was purely unintentional.    Darletta Moll, PA-C 05/29/20 2101    Naaman Plummer, MD 05/29/20 [id number]

## 2020-05-29 NOTE — ED Triage Notes (Signed)
Pt reports RLQ pain since 1pm, reports had some nausea and took Pepto bismol. Pt reports she had diarrhea yesterday no episodes today. Pt talks in complete sentences denies any other symptom.

## 2020-05-29 NOTE — ED Notes (Signed)
Patient transported to CT 

## 2020-05-29 NOTE — Discharge Instructions (Signed)
You have been diagnosed with enteritis which is an infection/inflammation of your intestine.  While I suspect this is likely viral, this also could be bacterial.  Given the location right before your ileostomy site, I will treat you with antibiotics.  Please take the Cipro and Flagyl for the entire 7 days, no matter when your symptoms resolve.  If you have significantly increasing pain, projectile vomiting, cannot keep down liquids for more than 12 hours, high fevers, constipation please return to the emergency department for further evaluation.  The Bentyl medication is to help with pain and cramping.  Zofran is for nausea.  Eat relatively bland foods.  Drink plenty of fluids.  Please take a probiotic while you are on the antibiotic.  You may take Tylenol and Motrin for additional pain or any fever control.  Follow-up primary care if needed.

## 2020-06-28 ENCOUNTER — Ambulatory Visit: Payer: Self-pay | Admitting: Urology

## 2020-07-06 ENCOUNTER — Ambulatory Visit: Payer: 59 | Admitting: Urology

## 2020-07-06 ENCOUNTER — Other Ambulatory Visit: Payer: Self-pay

## 2020-07-06 VITALS — BP 146/84 | HR 99 | Wt 143.0 lb

## 2020-07-06 DIAGNOSIS — N898 Other specified noninflammatory disorders of vagina: Secondary | ICD-10-CM

## 2020-07-06 DIAGNOSIS — N390 Urinary tract infection, site not specified: Secondary | ICD-10-CM | POA: Diagnosis not present

## 2020-07-06 LAB — URINALYSIS, COMPLETE
Bilirubin, UA: NEGATIVE
Glucose, UA: NEGATIVE
Ketones, UA: NEGATIVE
Nitrite, UA: NEGATIVE
Protein,UA: NEGATIVE
RBC, UA: NEGATIVE
Specific Gravity, UA: 1.02 (ref 1.005–1.030)
Urobilinogen, Ur: 0.2 mg/dL (ref 0.2–1.0)
pH, UA: 6.5 (ref 5.0–7.5)

## 2020-07-06 LAB — MICROSCOPIC EXAMINATION: Bacteria, UA: NONE SEEN

## 2020-07-06 MED ORDER — FLUCONAZOLE 150 MG PO TABS: 150.0000 mg | ORAL_TABLET | Freq: Once | ORAL | 0 refills | Status: AC

## 2020-07-06 MED ORDER — UROGESIC-BLUE 81.6 MG PO TABS: 81.6000 mg | ORAL_TABLET | Freq: Four times a day (QID) | ORAL | 0 refills | Status: DC | PRN

## 2020-07-06 NOTE — Progress Notes (Signed)
07/06/2020 9:12 AM   Whitney Cooper 08-20-1960 [id number]  Referring provider: Rusty Aus, MD [facility] [place],  [place] [address]  Chief Complaint  Patient presents with  . Urinary Frequency    HPI: 59 year old female with recurrent UTIs, irritative voiding symptoms returns today for follow-up.  She has been seen by me in May 2021 at which time she was complaining of recurrent UTIs.  At that point, she was started on topical estrogen cream, cranberry tablets and probiotics.  She was subsequently seen a month later by Debroah Loop, PA after having been treated by her PCP for UTI on 12/31/2019.  At that time, her urine had cleared.  She also saw Dr. Junious Silk in October complaining of dysuria and frequency.  Her urine was negative.  She was given samples of Uribel and advised to follow-up with me.  She was scheduled today as a routine follow-up.  She denies any dysuria or any urinary symptoms today.  She does mention today that over the weekend, she had a number of bowel movements and thought she was getting a hemorrhoid.  She started to have vaginal irritation and itching.  She also has excoriation of what sounds like her inner labia which is mildly painful, she wonders if she scratched it.  She denies any vaginal discharge.  She did try using Monistat cream without improvement.  She continues to use her estrogen cream 3 times a week, probiotics and cranberry tablets.  She does have a history of irritable bowel and fibromyalgia.   PMH: Past Medical History:  Diagnosis Date  . Beat, premature ventricular 06/21/2015  . Bundle branch block    palpitations a few weeks ago  . DDD (degenerative disc disease), cervical   . Diverticulitis of colon 07/07/2009  . Dry eyes   . Essential (primary) hypertension 06/21/2015  . Fibromyalgia   . GERD (gastroesophageal reflux disease)   . Headache    migraine  . History of chicken pox    . History of kidney stones   . Hyperlipidemia   . Hypertension   . IFG (impaired fasting glucose) 01/24/2015  . Lumbar herniated disc   . OAB (overactive bladder)   . Osteoarthritis   . Osteopenia determined by x-ray 02/10/2016  . Recurrent UTI   . Urinary incontinence   . Vertigo     Surgical History: Past Surgical History:  Procedure Laterality Date  . COLONOSCOPY WITH PROPOFOL N/A 11/11/2015   Procedure: COLONOSCOPY WITH PROPOFOL;  Surgeon: Lollie Sails, MD;  Location: [facility] ENDOSCOPY;  Service: Endoscopy;  Laterality: N/A;  . COLONOSCOPY WITH PROPOFOL N/A 06/24/2018   Procedure: COLONOSCOPY WITH PROPOFOL;  Surgeon: Lollie Sails, MD;  Location: [facility] ENDOSCOPY;  Service: Endoscopy;  Laterality: N/A;  . ESOPHAGOGASTRODUODENOSCOPY  11/13/2013  . ILEOSTOMY N/A 08/03/2018   Procedure: ILEOSTOMY;  Surgeon: Jules Husbands, MD;  Location: [facility] ORS;  Service: General;  Laterality: N/A;  . ILEOSTOMY CLOSURE N/A 12/30/2018   Procedure: ILEOSTOMY TAKEDOWN;  Surgeon: Jules Husbands, MD;  Location: [facility] ORS;  Service: General;  Laterality: N/A;  . LAPAROSCOPIC RIGHT COLECTOMY N/A 07/29/2018   Procedure: LAPAROSCOPIC SIGMOID COLECTOMY;  Surgeon: Jules Husbands, MD;  Location: [facility] ORS;  Service: General;  Laterality: N/A;  . LAPAROTOMY N/A 08/03/2018   Procedure: EXPLORATORY LAPAROTOMY;  Surgeon: Jules Husbands, MD;  Location: [facility] ORS;  Service: General;  Laterality: N/A;  . OOPHORECTOMY    . TOTAL ABDOMINAL HYSTERECTOMY  Jan  2012   Complete, due to heavy bleeding and grape-fruit size fibroid     Home Medications:  Allergies as of 07/06/2020      Reactions   Baclofen Other (See Comments)   Dizziness   Codeine Other (See Comments)   jittery   Hydrocodone Other (See Comments)   Headache,dizziness   Hydroxyzine    Made her very nervous   Sulfa Antibiotics Rash      Medication List       Accurate as of July 06, 2020  9:12 AM. If you have any questions, ask your nurse or  doctor.        acetaminophen 500 MG tablet Commonly known as: TYLENOL Take 1,000 mg by mouth 2 (two) times a day.   antiseptic oral rinse Liqd 15 mLs by Mouth Rinse route as needed for dry mouth.   atorvastatin 20 MG tablet Commonly known as: LIPITOR Take 1 tablet (20 mg total) by mouth every other day. In the evening.   Calcium Carbonate-Vitamin D 600-200 MG-UNIT Tabs Take by mouth.   Co Q-10 100 MG Caps Take 100 mg by mouth daily.   diazepam 2 MG tablet Commonly known as: Valium Take 1 tablet (2 mg total) by mouth every 12 (twelve) hours as needed for anxiety.   dicyclomine 10 MG capsule Commonly known as: Bentyl Take 1 capsule (10 mg total) by mouth 4 (four) times daily for 14 days.   esomeprazole 40 MG capsule Commonly known as: NEXIUM Take 40 mg by mouth daily before breakfast. Empty contents in applesauce and take daily   estradiol 0.1 MG/GM vaginal cream Commonly known as: ESTRACE Apply pea size to area Monday, Wed, Friday at bedtime   etodolac 400 MG tablet Commonly known as: LODINE Take 400 mg by mouth 2 (two) times daily.   famotidine 40 MG tablet Commonly known as: PEPCID Take 40 mg by mouth daily.   Fish Oil 1000 MG Caps Take 1,000 mg by mouth daily.   fluconazole 150 MG tablet Commonly known as: DIFLUCAN Take 1 tablet (150 mg total) by mouth once for 1 dose. Started by: Hollice Espy, MD   fluticasone 50 MCG/ACT nasal spray Commonly known as: FLONASE Place 2 sprays into both nostrils daily.   levocetirizine 5 MG tablet Commonly known as: Xyzal Take 1 tablet (5 mg total) by mouth every evening.   metoprolol succinate 25 MG 24 hr tablet Commonly known as: TOPROL-XL Take 1 tablet (25 mg total) by mouth daily.   metroNIDAZOLE 500 MG tablet Commonly known as: FLAGYL Take 1 tablet (500 mg total) by mouth 2 (two) times daily.   multivitamin with minerals Tabs tablet Take 1 tablet by mouth daily with lunch. Centrum Silver Chewable    ondansetron 4 MG disintegrating tablet Commonly known as: ZOFRAN-ODT Take 1 tablet (4 mg total) by mouth every 8 (eight) hours as needed for nausea or vomiting.   Saline 0.9 % Aers Place 1 spray into both nostrils daily as needed (dry nasal passages).   Systane Balance 0.6 % Soln Generic drug: Propylene Glycol Place 1 drop into both eyes 2 (two) times daily as needed (dry/irritated eyes.).   Urogesic-Blue 81.6 MG Tabs Take 1 tablet (81.6 mg total) by mouth 4 (four) times daily as needed. Started by: Hollice Espy, MD   vitamin C 250 MG tablet Commonly known as: ASCORBIC ACID Take 250 mg by mouth daily.   Vitamin D3 25 MCG (1000 UT) Caps Take 1,000 Units by mouth daily.  Allergies:  Allergies  Allergen Reactions  . Baclofen Other (See Comments)    Dizziness  . Codeine Other (See Comments)    jittery  . Hydrocodone Other (See Comments)    Headache,dizziness  . Hydroxyzine     Made her very nervous  . Sulfa Antibiotics Rash    Family History: Family History  Problem Relation Age of Onset  . Hypertension Mother   . Cancer Mother        skin  . Heart disease Father        CHF  . Hypertension Father   . Stroke Father        mini strokes  . Cancer Father        skin  . Heart attack Father   . Thyroid disease Father   . Ovarian cancer Maternal Grandmother 66  . Cancer Maternal Grandmother        ovarian/brain  . Heart attack Maternal Grandfather   . Heart attack Paternal Grandfather   . Diabetes Maternal Uncle   . Thyroid disease Sister   . Breast cancer Sister 57  . Kidney disease Paternal Grandmother     Social History:  reports that she has never smoked. She has never used smokeless tobacco. She reports that she does not drink alcohol and does not use drugs.   Physical Exam: BP (!) 146/84   Pulse 99   Wt 143 lb (64.9 kg)   BMI 25.33 kg/m   Constitutional:  Alert and oriented, No acute distress. HEENT: [place] AT, moist mucus membranes.  Trachea  midline, no masses. Cardiovascular: No clubbing, cyanosis, or edema. Respiratory: Normal respiratory effort, no increased work of breathing. Skin: No rashes, bruises or suspicious lesions. Neurologic: Grossly intact, no focal deficits, moving all 4 extremities. Psychiatric: Normal mood and affect.   Urinalysis Negative today  Assessment & Plan:    1. Recurrent UTI Decreased frequency of true infection since initiating topical estrogen cream, continue this 3 times a week  On several occasions, she has had symptoms mimicking infection which in fact are not true infection.  She was reminded and encouraged to return if she has any signs or symptoms of UTI for urine check.  Prescription for Lynnda Shields was provided today, this helped her with symptomatic relief during her last episode of dysuria which have been to be unrelated to the infection.  2. Vaginal irritation Vaginal itching concerning for possible candidal yeast infection, will prescribe Diflucan 150x1   Advised to follow-up with GYN/ PCP if fails to improve - Urinalysis, Complete  F/u prn  Hollice Espy, MD  [facility] [address] [place], [place] [address] [phone]

## 2020-08-02 ENCOUNTER — Ambulatory Visit: Payer: Self-pay | Admitting: Urology

## 2020-11-24 ENCOUNTER — Other Ambulatory Visit: Payer: Self-pay | Admitting: Internal Medicine

## 2020-11-24 DIAGNOSIS — M5010 Cervical disc disorder with radiculopathy, unspecified cervical region: Secondary | ICD-10-CM

## 2020-11-24 DIAGNOSIS — M5412 Radiculopathy, cervical region: Secondary | ICD-10-CM

## 2020-12-07 ENCOUNTER — Ambulatory Visit
Admission: RE | Admit: 2020-12-07 | Discharge: 2020-12-07 | Disposition: A | Discharge status: Home / Self Care | Payer: 59 | Source: Ambulatory Visit | Attending: Internal Medicine | Admitting: Internal Medicine

## 2020-12-07 ENCOUNTER — Other Ambulatory Visit: Payer: Self-pay

## 2020-12-07 DIAGNOSIS — M5010 Cervical disc disorder with radiculopathy, unspecified cervical region: Secondary | ICD-10-CM

## 2020-12-07 DIAGNOSIS — M5412 Radiculopathy, cervical region: Secondary | ICD-10-CM

## 2021-03-02 ENCOUNTER — Other Ambulatory Visit: Payer: Self-pay | Admitting: Internal Medicine

## 2021-03-02 DIAGNOSIS — Z1231 Encounter for screening mammogram for malignant neoplasm of breast: Secondary | ICD-10-CM

## 2021-04-07 ENCOUNTER — Other Ambulatory Visit: Payer: Self-pay

## 2021-04-07 ENCOUNTER — Ambulatory Visit
Admission: RE | Admit: 2021-04-07 | Discharge: 2021-04-07 | Disposition: A | Discharge status: Home / Self Care | Payer: BC Managed Care – PPO | Source: Ambulatory Visit | Attending: Internal Medicine | Admitting: Internal Medicine

## 2021-04-07 DIAGNOSIS — Z1231 Encounter for screening mammogram for malignant neoplasm of breast: Secondary | ICD-10-CM | POA: Diagnosis present

## 2021-10-25 ENCOUNTER — Ambulatory Visit
Admission: RE | Admit: 2021-10-25 | Discharge: 2021-10-25 | Disposition: A | Discharge status: Home / Self Care | Payer: BC Managed Care – PPO | Source: Ambulatory Visit | Attending: Internal Medicine | Admitting: Internal Medicine

## 2021-10-25 ENCOUNTER — Other Ambulatory Visit: Payer: Self-pay | Admitting: Internal Medicine

## 2021-10-25 DIAGNOSIS — R1031 Right lower quadrant pain: Secondary | ICD-10-CM | POA: Diagnosis not present

## 2021-10-25 DIAGNOSIS — K353 Acute appendicitis with localized peritonitis, without perforation or gangrene: Secondary | ICD-10-CM | POA: Diagnosis present

## 2021-12-28 ENCOUNTER — Other Ambulatory Visit: Payer: Self-pay

## 2021-12-28 ENCOUNTER — Encounter: Payer: Self-pay | Admitting: Emergency Medicine

## 2021-12-28 ENCOUNTER — Inpatient Hospital Stay: Payer: BC Managed Care – PPO

## 2021-12-28 ENCOUNTER — Inpatient Hospital Stay
Admission: EM | Admit: 2021-12-28 | Discharge: 2021-12-30 | DRG: 390 | Disposition: A | Discharge status: Home / Self Care | Payer: BC Managed Care – PPO | Attending: Internal Medicine | Admitting: Internal Medicine

## 2021-12-28 ENCOUNTER — Emergency Department: Payer: BC Managed Care – PPO

## 2021-12-28 DIAGNOSIS — Z823 Family history of stroke: Secondary | ICD-10-CM

## 2021-12-28 DIAGNOSIS — I10 Essential (primary) hypertension: Secondary | ICD-10-CM | POA: Diagnosis present

## 2021-12-28 DIAGNOSIS — Z87442 Personal history of urinary calculi: Secondary | ICD-10-CM | POA: Diagnosis not present

## 2021-12-28 DIAGNOSIS — M5030 Other cervical disc degeneration, unspecified cervical region: Secondary | ICD-10-CM | POA: Diagnosis present

## 2021-12-28 DIAGNOSIS — Z833 Family history of diabetes mellitus: Secondary | ICD-10-CM

## 2021-12-28 DIAGNOSIS — Z888 Allergy status to other drugs, medicaments and biological substances status: Secondary | ICD-10-CM

## 2021-12-28 DIAGNOSIS — M797 Fibromyalgia: Secondary | ICD-10-CM | POA: Diagnosis present

## 2021-12-28 DIAGNOSIS — Z8041 Family history of malignant neoplasm of ovary: Secondary | ICD-10-CM

## 2021-12-28 DIAGNOSIS — Z803 Family history of malignant neoplasm of breast: Secondary | ICD-10-CM | POA: Diagnosis not present

## 2021-12-28 DIAGNOSIS — Z79899 Other long term (current) drug therapy: Secondary | ICD-10-CM | POA: Diagnosis not present

## 2021-12-28 DIAGNOSIS — Z8249 Family history of ischemic heart disease and other diseases of the circulatory system: Secondary | ICD-10-CM | POA: Diagnosis not present

## 2021-12-28 DIAGNOSIS — R7301 Impaired fasting glucose: Secondary | ICD-10-CM | POA: Diagnosis present

## 2021-12-28 DIAGNOSIS — Z885 Allergy status to narcotic agent status: Secondary | ICD-10-CM

## 2021-12-28 DIAGNOSIS — K56609 Unspecified intestinal obstruction, unspecified as to partial versus complete obstruction: Secondary | ICD-10-CM | POA: Diagnosis present

## 2021-12-28 DIAGNOSIS — Z8349 Family history of other endocrine, nutritional and metabolic diseases: Secondary | ICD-10-CM

## 2021-12-28 DIAGNOSIS — Z881 Allergy status to other antibiotic agents status: Secondary | ICD-10-CM

## 2021-12-28 DIAGNOSIS — K56600 Partial intestinal obstruction, unspecified as to cause: Principal | ICD-10-CM | POA: Diagnosis present

## 2021-12-28 DIAGNOSIS — I700 Atherosclerosis of aorta: Secondary | ICD-10-CM | POA: Diagnosis present

## 2021-12-28 DIAGNOSIS — Z90710 Acquired absence of both cervix and uterus: Secondary | ICD-10-CM

## 2021-12-28 DIAGNOSIS — K219 Gastro-esophageal reflux disease without esophagitis: Secondary | ICD-10-CM | POA: Diagnosis present

## 2021-12-28 DIAGNOSIS — E785 Hyperlipidemia, unspecified: Secondary | ICD-10-CM | POA: Diagnosis present

## 2021-12-28 DIAGNOSIS — Z9049 Acquired absence of other specified parts of digestive tract: Secondary | ICD-10-CM

## 2021-12-28 DIAGNOSIS — Z9889 Other specified postprocedural states: Secondary | ICD-10-CM | POA: Diagnosis present

## 2021-12-28 LAB — COMPREHENSIVE METABOLIC PANEL
ALT: 19 U/L (ref 0–44)
AST: 24 U/L (ref 15–41)
Albumin: 4.4 g/dL (ref 3.5–5.0)
Alkaline Phosphatase: 49 U/L (ref 38–126)
Anion gap: 8 (ref 5–15)
BUN: 18 mg/dL (ref 8–23)
CO2: 26 mmol/L (ref 22–32)
Calcium: 9.6 mg/dL (ref 8.9–10.3)
Chloride: 107 mmol/L (ref 98–111)
Creatinine, Ser: 0.78 mg/dL (ref 0.44–1.00)
GFR, Estimated: >60 mL/min (ref >60)
Glucose, Bld: 175 mg/dL — ABNORMAL HIGH (ref 70–99)
Potassium: 4 mmol/L (ref 3.5–5.1)
Sodium: 141 mmol/L (ref 135–145)
Total Bilirubin: 0.2 mg/dL — ABNORMAL LOW (ref 0.3–1.2)
Total Protein: 7.8 g/dL (ref 6.5–8.1)

## 2021-12-28 LAB — CBC WITH DIFFERENTIAL/PLATELET
Abs Immature Granulocytes: 0.05 10*3/uL (ref 0.00–0.07)
Basophils Absolute: 0.1 10*3/uL (ref 0.0–0.1)
Basophils Relative: 0 %
Eosinophils Absolute: 0.2 10*3/uL (ref 0.0–0.5)
Eosinophils Relative: 1 %
HCT: 40.1 % (ref 36.0–46.0)
Hemoglobin: 12.6 g/dL (ref 12.0–15.0)
Immature Granulocytes: 0 %
Lymphocytes Relative: 13 %
Lymphs Abs: 2.2 10*3/uL (ref 0.7–4.0)
MCH: 27 pg (ref 26.0–34.0)
MCHC: 31.4 g/dL (ref 30.0–36.0)
MCV: 85.9 fL (ref 80.0–100.0)
Monocytes Absolute: 0.6 10*3/uL (ref 0.1–1.0)
Monocytes Relative: 3 %
Neutro Abs: 13.7 10*3/uL — ABNORMAL HIGH (ref 1.7–7.7)
Neutrophils Relative %: 83 %
Platelets: 326 10*3/uL (ref 150–400)
RBC: 4.67 MIL/uL (ref 3.87–5.11)
RDW: 14.2 % (ref 11.5–15.5)
WBC: 16.8 10*3/uL — ABNORMAL HIGH (ref 4.0–10.5)
nRBC: 0 % (ref 0.0–0.2)

## 2021-12-28 LAB — URINALYSIS, ROUTINE W REFLEX MICROSCOPIC
Bacteria, UA: NONE SEEN
Bilirubin Urine: NEGATIVE
Glucose, UA: NEGATIVE mg/dL
Hgb urine dipstick: NEGATIVE
Ketones, ur: NEGATIVE mg/dL
Nitrite: NEGATIVE
Protein, ur: NEGATIVE mg/dL
Specific Gravity, Urine: 1.024 (ref 1.005–1.030)
pH: 5 (ref 5.0–8.0)

## 2021-12-28 LAB — HIV ANTIBODY (ROUTINE TESTING W REFLEX): HIV Screen 4th Generation wRfx: NONREACTIVE

## 2021-12-28 LAB — HEMOGLOBIN A1C
Hgb A1c MFr Bld: 5.5 % (ref 4.8–5.6)
Mean Plasma Glucose: 111.15 mg/dL

## 2021-12-28 LAB — GLUCOSE, CAPILLARY
Glucose-Capillary: 78 mg/dL (ref 70–99)
Glucose-Capillary: 82 mg/dL (ref 70–99)
Glucose-Capillary: 84 mg/dL (ref 70–99)

## 2021-12-28 LAB — CBG MONITORING, ED: Glucose-Capillary: 88 mg/dL (ref 70–99)

## 2021-12-28 LAB — LIPASE, BLOOD: Lipase: 34 U/L (ref 11–51)

## 2021-12-28 MED ORDER — ACETAMINOPHEN 500 MG PO TABS
1000.0000 mg | ORAL_TABLET | Freq: Four times a day (QID) | ORAL | Status: DC | PRN
  Administered 2021-12-28 – 2021-12-29 (×2): 1000 mg via ORAL
  Filled 2021-12-28 (×2): qty 2

## 2021-12-28 MED ORDER — SODIUM CHLORIDE 0.9 % IV BOLUS
1000.0000 mL | Freq: Once | INTRAVENOUS | Status: AC
  Administered 2021-12-28: 1000 mL via INTRAVENOUS

## 2021-12-28 MED ORDER — ONDANSETRON HCL 4 MG/2ML IJ SOLN: 4.0000 mg | Freq: Four times a day (QID) | INTRAMUSCULAR | Status: DC | PRN

## 2021-12-28 MED ORDER — METOPROLOL SUCCINATE ER 25 MG PO TB24
25.0000 mg | ORAL_TABLET | Freq: Every day | ORAL | Status: DC
  Administered 2021-12-28 – 2021-12-30 (×3): 25 mg via ORAL
  Filled 2021-12-28 (×3): qty 1

## 2021-12-28 MED ORDER — ENOXAPARIN SODIUM 40 MG/0.4ML IJ SOSY
40.0000 mg | PREFILLED_SYRINGE | INTRAMUSCULAR | Status: DC
  Filled 2021-12-28: qty 0.4

## 2021-12-28 MED ORDER — DIATRIZOATE MEGLUMINE & SODIUM 66-10 % PO SOLN
90.0000 mL | Freq: Once | ORAL | Status: AC
  Administered 2021-12-28: 90 mL via NASOGASTRIC

## 2021-12-28 MED ORDER — POTASSIUM CHLORIDE IN NACL 20-0.9 MEQ/L-% IV SOLN
INTRAVENOUS | Status: DC
  Filled 2021-12-28 (×5): qty 1000

## 2021-12-28 MED ORDER — ONDANSETRON HCL 4 MG/2ML IJ SOLN
4.0000 mg | Freq: Once | INTRAMUSCULAR | Status: AC
  Administered 2021-12-28: 4 mg via INTRAVENOUS
  Filled 2021-12-28: qty 2

## 2021-12-28 MED ORDER — BLISTEX MEDICATED EX OINT
1.0000 "application " | TOPICAL_OINTMENT | CUTANEOUS | Status: DC | PRN
  Filled 2021-12-28: qty 6.3

## 2021-12-28 MED ORDER — ROSUVASTATIN CALCIUM 5 MG PO TABS
5.0000 mg | ORAL_TABLET | Freq: Every evening | ORAL | Status: DC
  Administered 2021-12-28 – 2021-12-29 (×2): 5 mg via ORAL
  Filled 2021-12-28 (×3): qty 1

## 2021-12-28 MED ORDER — SALINE SPRAY 0.65 % NA SOLN: 1.0000 | NASAL | Status: DC | PRN

## 2021-12-28 MED ORDER — PHENOL 1.4 % MT LIQD
1.0000 | OROMUCOSAL | Status: DC | PRN
  Administered 2021-12-28: 1 via OROMUCOSAL
  Filled 2021-12-28: qty 177

## 2021-12-28 MED ORDER — MORPHINE SULFATE (PF) 2 MG/ML IV SOLN: 2.0000 mg | INTRAVENOUS | Status: DC | PRN

## 2021-12-28 MED ORDER — ONDANSETRON HCL 4 MG PO TABS: 4.0000 mg | ORAL_TABLET | Freq: Four times a day (QID) | ORAL | Status: DC | PRN

## 2021-12-28 MED ORDER — ESTRADIOL 0.1 MG/GM VA CREA: 1.0000 | TOPICAL_CREAM | VAGINAL | Status: DC

## 2021-12-28 MED ORDER — IOHEXOL 300 MG/ML  SOLN
100.0000 mL | Freq: Once | INTRAMUSCULAR | Status: AC | PRN
  Administered 2021-12-28: 100 mL via INTRAVENOUS

## 2021-12-28 MED ORDER — PANTOPRAZOLE SODIUM 40 MG IV SOLR
40.0000 mg | INTRAVENOUS | Status: DC
  Administered 2021-12-28 – 2021-12-29 (×2): 40 mg via INTRAVENOUS
  Filled 2021-12-28 (×2): qty 10

## 2021-12-28 MED ORDER — LIP MEDEX EX OINT: 1.0000 "application " | TOPICAL_OINTMENT | CUTANEOUS | Status: DC | PRN

## 2021-12-28 MED ORDER — DIAZEPAM 2 MG PO TABS
2.0000 mg | ORAL_TABLET | Freq: Two times a day (BID) | ORAL | Status: DC | PRN
  Administered 2021-12-28: 2 mg via ORAL

## 2021-12-28 MED ORDER — MORPHINE SULFATE (PF) 4 MG/ML IV SOLN
4.0000 mg | Freq: Once | INTRAVENOUS | Status: AC
  Administered 2021-12-28: 4 mg via INTRAVENOUS
  Filled 2021-12-28: qty 1

## 2021-12-28 NOTE — ED Provider Notes (Signed)
[facility] Provider Note    Event Date/Time   First MD Initiated Contact with Patient 12/28/21 0413     (approximate)   History   Chief Complaint: Abdominal Pain   HPI  Whitney Cooper is a 61 y.o. female with a history of hypertension GERD fibromyalgia and partial colectomy for diverticulitis who comes ED complaining of generalized abdominal pain, gradual onset and worsening for the last 2 days.  Nausea, no frank vomiting.  No diarrhea.  No fever.     Physical Exam   Triage Vital Signs: ED Triage Vitals [12/28/21 0348]  Enc Vitals Group     BP (!) 141/77     Pulse Rate 92     Resp 18     Temp 98.4 F (36.9 C)     Temp Source Oral     SpO2 98 %     Weight 140 lb (63.5 kg)     Height '5\' 3"'$  (1.6 m)     Head Circumference      Peak Flow      Pain Score 10     Pain Loc      Pain Edu?      Excl. in [place]?     Most recent vital signs: Vitals:   12/28/21 0505 12/28/21 0647  BP: 125/72 (!) 147/70  Pulse: 88 94  Resp: 16 18  Temp:    SpO2: 99% 100%    General: Awake, no distress.  CV:  Good peripheral perfusion.  Regular rate rhythm Resp:  Normal effort.  Clear to auscultation bilaterally Abd:  No distention.  Generalized tenderness with guarding.  No rigidity Other:  Dry mucous membranes.  No lower extremity edema, no rash   ED Results / Procedures / Treatments   Labs (all labs ordered are listed, but only abnormal results are displayed) Labs Reviewed  CBC WITH DIFFERENTIAL/PLATELET - Abnormal; Notable for the following components:      Result Value   WBC 16.8 (*)    Neutro Abs 13.7 (*)    All other components within normal limits  COMPREHENSIVE METABOLIC PANEL - Abnormal; Notable for the following components:   Glucose, Bld 175 (*)    Total Bilirubin 0.2 (*)    All other components within normal limits  URINALYSIS, ROUTINE W REFLEX MICROSCOPIC - Abnormal; Notable for the following components:   Color, Urine YELLOW (*)     APPearance HAZY (*)    Leukocytes,Ua TRACE (*)    All other components within normal limits  LIPASE, BLOOD     EKG    RADIOLOGY CT abdomen pelvis viewed and interpreted by me, shows dilated loops of small bowel consistent with SBO.  Radiology report reviewed noting partial SBO at the anastomosis plate from previous colectomy   PROCEDURES:  Procedures   MEDICATIONS ORDERED IN ED: Medications  morphine (PF) 4 MG/ML injection 4 mg (4 mg Intravenous Given 12/28/21 0510)  ondansetron (ZOFRAN) injection 4 mg (4 mg Intravenous Given 12/28/21 0510)  sodium chloride 0.9 % bolus 1,000 mL (1,000 mLs Intravenous Bolus 12/28/21 0510)  iohexol (OMNIPAQUE) 300 MG/ML solution 100 mL (100 mLs Intravenous Contrast Given 12/28/21 0529)     IMPRESSION / MDM / ASSESSMENT AND PLAN / ED COURSE  I reviewed the triage vital signs and the nursing notes.                              Differential diagnosis  includes, but is not limited to, appendicitis, cholecystitis, pancreatitis, intra-abdominal abscess, SBO, UTI, kidney stone  Patient's presentation is most consistent with acute presentation with potential threat to life or bodily function.  Patient presents with severe abdominal pain complicated abdomen from prior surgical history.  Vital signs are unremarkable, labs show leukocytosis.  Nontoxic but non-reassuring abdominal exam.  CT obtained which does show partial SBO.  Is unremarkable.  Will place NG tube, case discussed with hospitalist for further management.       FINAL CLINICAL IMPRESSION(S) / ED DIAGNOSES   Final diagnoses:  Partial small bowel obstruction ([place])     Rx / DC Orders   ED Discharge Orders     None        Note:  This document was prepared using Dragon voice recognition software and may include unintentional dictation errors.   Carrie Mew, MD 12/28/21 [phone]

## 2021-12-28 NOTE — Assessment & Plan Note (Signed)
Treatment as outlined in 1 

## 2021-12-28 NOTE — Assessment & Plan Note (Signed)
Stable ?Continue IV PPI ?

## 2021-12-28 NOTE — Assessment & Plan Note (Signed)
Obtain hemoglobin A1c levels Check blood sugars every 4 hours

## 2021-12-28 NOTE — Consult Note (Signed)
Subjective:   CC: Partial small bowel obstruction  HPI:  Whitney Cooper is a 61 y.o. female who was consulted by Agbata for issue above.  Symptoms were first noted 2 days ago. Pain is sharp, acute and severe.  Associated with nausea, exacerbated by palpation.  Work-up in the ED concerning for a small bowel obstruction.  Patient has had NG tube placed since then.  At time of exam she reports passing flatus couple times since admission.  Pain better controlled as well.   Past Medical History:  has a past medical history of Beat, premature ventricular (06/21/2015), Bundle branch block, DDD (degenerative disc disease), cervical, Diverticulitis of colon (07/07/2009), Dry eyes, Essential (primary) hypertension (06/21/2015), Fibromyalgia, GERD (gastroesophageal reflux disease), Headache, History of chicken pox, History of kidney stones, Hyperlipidemia, Hypertension, IFG (impaired fasting glucose) (01/24/2015), Lumbar herniated disc, OAB (overactive bladder), Osteoarthritis, Osteopenia determined by x-ray (02/10/2016), Recurrent UTI, Urinary incontinence, and Vertigo.  Past Surgical History:  Past Surgical History:  Procedure Laterality Date   COLONOSCOPY WITH PROPOFOL N/A 11/11/2015   Procedure: COLONOSCOPY WITH PROPOFOL;  Surgeon: Lollie Sails, MD;  Location: [facility] ENDOSCOPY;  Service: Endoscopy;  Laterality: N/A;   COLONOSCOPY WITH PROPOFOL N/A 06/24/2018   Procedure: COLONOSCOPY WITH PROPOFOL;  Surgeon: Lollie Sails, MD;  Location: [facility] ENDOSCOPY;  Service: Endoscopy;  Laterality: N/A;   ESOPHAGOGASTRODUODENOSCOPY  11/13/2013   ILEOSTOMY N/A 08/03/2018   Procedure: ILEOSTOMY;  Surgeon: Jules Husbands, MD;  Location: [facility] ORS;  Service: General;  Laterality: N/A;   ILEOSTOMY CLOSURE N/A 12/30/2018   Procedure: ILEOSTOMY TAKEDOWN;  Surgeon: Jules Husbands, MD;  Location: [facility] ORS;  Service: General;  Laterality: N/A;   LAPAROSCOPIC RIGHT COLECTOMY N/A 07/29/2018   Procedure: LAPAROSCOPIC SIGMOID  COLECTOMY;  Surgeon: Jules Husbands, MD;  Location: [facility] ORS;  Service: General;  Laterality: N/A;   LAPAROTOMY N/A 08/03/2018   Procedure: EXPLORATORY LAPAROTOMY;  Surgeon: Jules Husbands, MD;  Location: [facility] ORS;  Service: General;  Laterality: N/A;   OOPHORECTOMY     TOTAL ABDOMINAL HYSTERECTOMY  Jan 2012   Complete, due to heavy bleeding and grape-fruit size fibroid     Family History: family history includes Breast cancer (age of onset: 64) in her sister; Cancer in her father, maternal grandmother, and mother; Diabetes in her maternal uncle; Heart attack in her father, maternal grandfather, and paternal grandfather; Heart disease in her father; Hypertension in her father and mother; Kidney disease in her paternal grandmother; Ovarian cancer (age of onset: 43) in her maternal grandmother; Stroke in her father; Thyroid disease in her father and sister.  Social History:  reports that she has never smoked. She has never used smokeless tobacco. She reports that she does not drink alcohol and does not use drugs.  Current Medications:  Prior to Admission medications   Medication Sig Start Date End Date Taking? Authorizing Provider  acetaminophen (TYLENOL) 500 MG tablet Take 1,000 mg by mouth 2 (two) times a day.   Yes [provider]  amitriptyline (ELAVIL) 10 MG tablet Take 10 mg by mouth at bedtime.   Yes [provider]  Cholecalciferol (VITAMIN D3) 1000 units CAPS Take 1,000 Units by mouth daily.  01/20/16  Yes Lada, Satira Anis, MD  Coenzyme Q10 (CO Q-10) 100 MG CAPS Take 100 mg by mouth daily.    Yes [provider]  diazepam (VALIUM) 2 MG tablet Take 1 tablet (2 mg total) by mouth every 12 (twelve) hours as needed for anxiety. 02/18/19  Yes Poulose, Bethel Born, NP  fluticasone (FLONASE) 50 MCG/ACT nasal spray Place 2 sprays into both nostrils daily. 04/17/19  Yes Delsa Grana, PA-C  metoprolol succinate (TOPROL-XL) 25 MG 24 hr tablet Take 1 tablet (25 mg total) by  mouth daily. 10/01/18  Yes Lada, Satira Anis, MD  Multiple Vitamin (MULTIVITAMIN WITH MINERALS) TABS tablet Take 1 tablet by mouth daily with lunch. Centrum Silver Chewable   Yes [provider]  olmesartan-hydrochlorothiazide (BENICAR HCT) 20-12.5 MG tablet Take 1 tablet by mouth daily.   Yes [provider]  omeprazole (PRILOSEC) 40 MG capsule Take 40 mg by mouth in the morning.   Yes [provider]  rosuvastatin (CRESTOR) 5 MG tablet Take 5 mg by mouth daily.   Yes [provider]  sucralfate (CARAFATE) 1 g tablet Take 1 g by mouth at bedtime.   Yes [provider]  SYSTANE BALANCE 0.6 % SOLN Place 1 drop into both eyes 2 (two) times daily as needed (dry/irritated eyes.).   Yes [provider]  vitamin C (ASCORBIC ACID) 250 MG tablet Take 250 mg by mouth daily.   Yes [provider]  antiseptic oral rinse (BIOTENE) LIQD 15 mLs by Mouth Rinse route as needed for dry mouth.    [provider]  Calcium Carbonate-Vitamin D 600-200 MG-UNIT TABS Take by mouth.    [provider]  dicyclomine (BENTYL) 10 MG capsule Take 1 capsule (10 mg total) by mouth 4 (four) times daily for 14 days. 05/29/20 06/12/20  Cuthriell, Charline Bills, PA-C  esomeprazole (NEXIUM) 40 MG capsule Take 40 mg by mouth daily before breakfast. Empty contents in applesauce and take daily Patient not taking: Reported on 12/28/2021 06/13/18   [provider]  estradiol (ESTRACE) 0.1 MG/GM vaginal cream Apply pea size to area Monday, Wed, Friday at bedtime Patient not taking: Reported on 12/28/2021 11/24/19   Hollice Espy, MD  etodolac (LODINE) 400 MG tablet Take 400 mg by mouth 2 (two) times daily. 04/22/20   [provider]  famotidine (PEPCID) 40 MG tablet Take 40 mg by mouth daily. Patient not taking: Reported on 12/28/2021 05/26/20   [provider]  levocetirizine (XYZAL) 5 MG tablet Take 1 tablet (5 mg total) by mouth every evening.  04/17/19   Delsa Grana, PA-C  Methen-Hyosc-Meth Blue-Na Phos (UROGESIC-BLUE) 81.6 MG TABS Take 1 tablet (81.6 mg total) by mouth 4 (four) times daily as needed. 07/06/20   Hollice Espy, MD  metroNIDAZOLE (FLAGYL) 500 MG tablet Take 1 tablet (500 mg total) by mouth 2 (two) times daily. Patient not taking: Reported on 12/28/2021 05/29/20   Cuthriell, Charline Bills, PA-C  Omega-3 Fatty Acids (FISH OIL) 1000 MG CAPS Take 1,000 mg by mouth daily.  Patient not taking: Reported on 12/28/2021    [provider]  ondansetron (ZOFRAN-ODT) 4 MG disintegrating tablet Take 1 tablet (4 mg total) by mouth every 8 (eight) hours as needed for nausea or vomiting. Patient not taking: Reported on 12/28/2021 05/29/20   Cuthriell, Charline Bills, PA-C  Saline 0.9 % AERS Place 1 spray into both nostrils daily as needed (dry nasal passages).    [provider]    Allergies:  Allergies as of 12/28/2021 - Review Complete 12/28/2021  Allergen Reaction Noted   Baclofen Other (See Comments) 04/14/2018   Codeine Other (See Comments) 01/14/2015   Hydrocodone Other (See Comments) 06/23/2018   Hydroxyzine  12/13/2017   Sulfa antibiotics Rash 01/14/2015    ROS:  General: Denies weight  loss, weight gain, fatigue, fevers, chills, and night sweats. Eyes: Denies blurry vision, double vision, eye pain, itchy eyes, and tearing. Ears: Denies hearing loss, earache, and ringing in ears. Nose: Denies sinus pain, congestion, infections, runny nose, and nosebleeds. Mouth/throat: Denies hoarseness, sore throat, bleeding gums, and difficulty swallowing. Heart: Denies chest pain, palpitations, racing heart, irregular heartbeat, leg pain or swelling, and decreased activity tolerance. Respiratory: Denies breathing difficulty, shortness of breath, wheezing, cough, and sputum. GI: Denies change in appetite, heartburn, nausea, vomiting, constipation, diarrhea, and blood in stool. GU: Denies difficulty urinating, pain with urinating,  urgency, frequency, blood in urine. Musculoskeletal: Denies joint stiffness, pain, swelling, muscle weakness. Skin: Denies rash, itching, mass, tumors, sores, and boils Neurologic: Denies headache, fainting, dizziness, seizures, numbness, and tingling. Psychiatric: Denies depression, anxiety, difficulty sleeping, and memory loss. Endocrine: Denies heat or cold intolerance, and increased thirst or urination. Blood/lymph: Denies easy bruising, easy bruising, and swollen glands     Objective:     BP 132/75   Pulse 78   Temp 98.4 F (36.9 C) (Oral)   Resp 16   Ht '5\' 3"'$  (1.6 m)   Wt 63.5 kg   SpO2 98%   BMI 24.80 kg/m   Constitutional :  alert, cooperative, appears stated age, and no distress  Lymphatics/Throat:  no asymmetry, masses, or scars  Respiratory:  clear to auscultation bilaterally  Cardiovascular:  regular rate and rhythm  Gastrointestinal: Soft, no guarding, minor tenderness to palpation right lower quadrant consistent with the area of narrowing seen on CT  .   Musculoskeletal: Steady movement  Skin: Cool and moist. surgical scars   Psychiatric: Normal affect, non-agitated, not confused       LABS:     Latest Ref Rng & Units 12/28/2021    3:49 AM 05/29/2020    4:46 PM 02/18/2019   10:13 AM  CMP  Glucose 70 - 99 mg/dL 175  115  86   BUN 8 - 23 mg/dL '18  9  12   '$ Creatinine 0.44 - 1.00 mg/dL 0.78  0.73  0.61   Sodium 135 - 145 mmol/L 141  140  146   Potassium 3.5 - 5.1 mmol/L 4.0  4.4  4.9   Chloride 98 - 111 mmol/L 107  101  105   CO2 22 - 32 mmol/L '26  26  25   '$ Calcium 8.9 - 10.3 mg/dL 9.6  9.7  9.7   Total Protein 6.5 - 8.1 g/dL 7.8  7.7  6.7   Total Bilirubin 0.3 - 1.2 mg/dL 0.2  0.7  0.4   Alkaline Phos 38 - 126 U/L 49  54  54   AST 15 - 41 U/L '24  22  26   '$ ALT 0 - 44 U/L 19  24  49       Latest Ref Rng & Units 12/28/2021    3:49 AM 05/29/2020    4:46 PM 02/18/2019   10:13 AM  CBC  WBC 4.0 - 10.5 K/uL 16.8  15.4  8.5   Hemoglobin 12.0 - 15.0 g/dL 12.6   14.2  12.8   Hematocrit 36.0 - 46.0 % 40.1  42.8  38.8   Platelets 150 - 400 K/uL 326  329  290     RADS: CLINICAL DATA:  Abdominal pain, nausea and vomiting.   EXAM: CT ABDOMEN AND PELVIS WITH CONTRAST   TECHNIQUE: Multidetector CT imaging of the abdomen and pelvis was performed using the standard protocol following bolus administration of  intravenous contrast.   RADIATION DOSE REDUCTION: This exam was performed according to the departmental dose-optimization program which includes automated exposure control, adjustment of the mA and/or kV according to patient size and/or use of iterative reconstruction technique.   CONTRAST:  196m OMNIPAQUE IOHEXOL 300 MG/ML  SOLN   COMPARISON:  10/25/2021   FINDINGS: Lower chest: No acute abnormality.   Hepatobiliary: No focal liver abnormality is seen. No gallstones, gallbladder wall thickening, or biliary dilatation.   Pancreas: Unremarkable. No pancreatic ductal dilatation or surrounding inflammatory changes.   Spleen: Normal in size without focal abnormality.   Adrenals/Urinary Tract: Normal adrenal glands. No signs of nephrolithiasis or hydronephrosis. Urinary bladder appears normal.   Stomach/Bowel: Postoperative changes involving the distal small bowel loops noted with right lower quadrant enteroenteric anastomosis is identified, image 60/2. Dilatation of the mid ileum up to the level of the enteroenteric anastomosis is identified. The dilated distal small bowel loops measure up to 3 cm with several air-fluid levels. Normal caliber small bowel loops distal to the anastomosis identified. Normal caliber colon is noted. Rectosigmoid anastomotic suture chain is identified within the lower pelvis. There are no signs to suggest bowel perforation.   Vascular/Lymphatic: Aortic atherosclerosis. No aneurysm. No abdominopelvic adenopathy.   Reproductive: Status post hysterectomy. No adnexal masses.   Other: Trace free fluid  identified within the pelvis and along the inferior margin of the right lobe of liver. No discrete fluid collections identified.   Musculoskeletal: No acute or significant osseous findings.   IMPRESSION: 1. Findings compatible with partial small bowel obstruction. The point of obstruction is felt to likely be at the level of the enteroenteric anastomosis within the right hemiabdomen. 2. Trace free fluid identified within the pelvis and along the inferior margin of the right lobe of liver. 3. Aortic Atherosclerosis (ICD10-I70.0).     Electronically Signed   By: TKerby MoorsM.D.   On: 12/28/2021 06:07 Assessment:   Partial small bowel obstruction based on clinical exam, history and CT findings as noted above.  Plan:   With report of passing flatus, hopefully this is already resolving.  We will proceed with small bowel follow-through to ensure passage of contrast, prior to initiating diet.  Continue NG tube decompression, IV fluid resuscitation in the meantime.   The patient verbalized understanding and all questions were answered to the patient's satisfaction.  labs/images/medications/previous chart entries reviewed personally and relevant changes/updates noted above.

## 2021-12-28 NOTE — ED Triage Notes (Signed)
Patient ambulatory to triage with steady gait, without difficulty or distress noted; pt reports since midnigiht haivng mid lower abd pain, nonradiating accomp by nausea

## 2021-12-28 NOTE — ED Notes (Signed)
Report given to Kim RN.

## 2021-12-28 NOTE — Assessment & Plan Note (Signed)
--   Stable.  Continue metoprolol. 

## 2021-12-28 NOTE — Assessment & Plan Note (Signed)
Patient with a history of prior abdominal surgery for complicated diverticulitis which included partial colectomy with anastomosis who presents to the ER for evaluation of sudden onset periumbilical pain associated with nausea and vomiting. Imaging shows findings compatible with partial small bowel obstruction. The point of obstruction is felt to likely be at the level of the enteroenteric anastomosis within the right hemiabdomen. Keep patient n.p.o. Gastric decompression with NG tube Pain control, IV fluid hydration, IV PPI Consult surgery

## 2021-12-28 NOTE — H&P (Signed)
History and Physical    Patient: Whitney Cooper:[id number] DOB: March 13, 1961 DOA: 12/28/2021 DOS: the patient was seen and examined on 12/28/2021 PCP: Rusty Aus, MD  Patient coming from: Home  Chief Complaint:  Chief Complaint  Patient presents with   Abdominal Pain   HPI: Whitney Cooper is a 62 y.o. female with medical history significant for diverticulosis, hypertension, dyslipidemia, GERD, degenerative disc disease, partial colectomy for diverticulitis with reanastomosis who presents to the ER for sudden onset intermittent abdominal pain mostly in the periumbilical area, rated 7 x 10 in intensity at its worst, nonradiating and associated with nausea and emesis. Patient states her symptoms started at about midnight and she took some Pepto-Bismol without any improvement in her symptoms. She decided to come to the ER due to persistence of her symptoms. She has had chills but denies having any fever, no dizziness, no lightheadedness, no diaphoresis, no chest pain, no shortness of breath, no cough, no headache, no blurred vision no focal deficit. Imaging shows findings compatible with partial small bowel obstruction. The point of obstruction is felt to likely be at the level of the enteroenteric anastomosis within the right hemiabdomen.Trace free fluid identified within the pelvis and along the inferior margin of the right lobe of liver.  Review of Systems: As mentioned in the history of present illness. All other systems reviewed and are negative. Past Medical History:  Diagnosis Date   Beat, premature ventricular 06/21/2015   Bundle branch block    palpitations a few weeks ago   DDD (degenerative disc disease), cervical    Diverticulitis of colon 07/07/2009   Dry eyes    Essential (primary) hypertension 06/21/2015   Fibromyalgia    GERD (gastroesophageal reflux disease)    Headache    migraine   History of chicken pox    History of kidney stones    Hyperlipidemia     Hypertension    IFG (impaired fasting glucose) 01/24/2015   Lumbar herniated disc    OAB (overactive bladder)    Osteoarthritis    Osteopenia determined by x-ray 02/10/2016   Recurrent UTI    Urinary incontinence    Vertigo    Past Surgical History:  Procedure Laterality Date   COLONOSCOPY WITH PROPOFOL N/A 11/11/2015   Procedure: COLONOSCOPY WITH PROPOFOL;  Surgeon: Lollie Sails, MD;  Location: [facility] ENDOSCOPY;  Service: Endoscopy;  Laterality: N/A;   COLONOSCOPY WITH PROPOFOL N/A 06/24/2018   Procedure: COLONOSCOPY WITH PROPOFOL;  Surgeon: Lollie Sails, MD;  Location: [facility] ENDOSCOPY;  Service: Endoscopy;  Laterality: N/A;   ESOPHAGOGASTRODUODENOSCOPY  11/13/2013   ILEOSTOMY N/A 08/03/2018   Procedure: ILEOSTOMY;  Surgeon: Jules Husbands, MD;  Location: [facility] ORS;  Service: General;  Laterality: N/A;   ILEOSTOMY CLOSURE N/A 12/30/2018   Procedure: ILEOSTOMY TAKEDOWN;  Surgeon: Jules Husbands, MD;  Location: [facility] ORS;  Service: General;  Laterality: N/A;   LAPAROSCOPIC RIGHT COLECTOMY N/A 07/29/2018   Procedure: LAPAROSCOPIC SIGMOID COLECTOMY;  Surgeon: Jules Husbands, MD;  Location: [facility] ORS;  Service: General;  Laterality: N/A;   LAPAROTOMY N/A 08/03/2018   Procedure: EXPLORATORY LAPAROTOMY;  Surgeon: Jules Husbands, MD;  Location: [facility] ORS;  Service: General;  Laterality: N/A;   OOPHORECTOMY     TOTAL ABDOMINAL HYSTERECTOMY  Jan 2012   Complete, due to heavy bleeding and grape-fruit size fibroid    Social History:  reports that she has never smoked. She has never used smokeless tobacco. She reports that she does not  drink alcohol and does not use drugs.  Allergies  Allergen Reactions   Baclofen Other (See Comments)    Dizziness   Codeine Other (See Comments)    jittery   Hydrocodone Other (See Comments)    Headache,dizziness   Hydroxyzine     Made her very nervous   Sulfa Antibiotics Rash    Family History  Problem Relation Age of Onset   Hypertension Mother    Cancer  Mother        skin   Heart disease Father        CHF   Hypertension Father    Stroke Father        mini strokes   Cancer Father        skin   Heart attack Father    Thyroid disease Father    Ovarian cancer Maternal Grandmother 30   Cancer Maternal Grandmother        ovarian/brain   Heart attack Maternal Grandfather    Heart attack Paternal Grandfather    Diabetes Maternal Uncle    Thyroid disease Sister    Breast cancer Sister 75   Kidney disease Paternal Grandmother     Prior to Admission medications   Medication Sig Start Date End Date Taking? Authorizing Provider  acetaminophen (TYLENOL) 500 MG tablet Take 1,000 mg by mouth 2 (two) times a day.    [provider]  antiseptic oral rinse (BIOTENE) LIQD 15 mLs by Mouth Rinse route as needed for dry mouth.    [provider]  atorvastatin (LIPITOR) 20 MG tablet Take 1 tablet (20 mg total) by mouth every other day. In the evening. 01/12/19   Poulose, Bethel Born, NP  Calcium Carbonate-Vitamin D 600-200 MG-UNIT TABS Take by mouth.    [provider]  Cholecalciferol (VITAMIN D3) 1000 units CAPS Take 1,000 Units by mouth daily.  01/20/16   Arnetha Courser, MD  Coenzyme Q10 (CO Q-10) 100 MG CAPS Take 100 mg by mouth daily.     [provider]  diazepam (VALIUM) 2 MG tablet Take 1 tablet (2 mg total) by mouth every 12 (twelve) hours as needed for anxiety. 02/18/19   Poulose, Bethel Born, NP  dicyclomine (BENTYL) 10 MG capsule Take 1 capsule (10 mg total) by mouth 4 (four) times daily for 14 days. 05/29/20 06/12/20  Cuthriell, Charline Bills, PA-C  esomeprazole (NEXIUM) 40 MG capsule Take 40 mg by mouth daily before breakfast. Empty contents in applesauce and take daily 06/13/18   [provider]  estradiol (ESTRACE) 0.1 MG/GM vaginal cream Apply pea size to area Monday, Wed, Friday at bedtime 11/24/19   Hollice Espy, MD  etodolac (LODINE) 400 MG tablet Take 400 mg by mouth 2 (two) times daily. 04/22/20    [provider]  famotidine (PEPCID) 40 MG tablet Take 40 mg by mouth daily. 05/26/20   [provider]  fluticasone (FLONASE) 50 MCG/ACT nasal spray Place 2 sprays into both nostrils daily. 04/17/19   Delsa Grana, PA-C  levocetirizine (XYZAL) 5 MG tablet Take 1 tablet (5 mg total) by mouth every evening. 04/17/19   Delsa Grana, PA-C  Methen-Hyosc-Meth Blue-Na Phos (UROGESIC-BLUE) 81.6 MG TABS Take 1 tablet (81.6 mg total) by mouth 4 (four) times daily as needed. 07/06/20   Hollice Espy, MD  metoprolol succinate (TOPROL-XL) 25 MG 24 hr tablet Take 1 tablet (25 mg total) by mouth daily. 10/01/18   Arnetha Courser, MD  metroNIDAZOLE (FLAGYL) 500 MG tablet Take 1 tablet (  500 mg total) by mouth 2 (two) times daily. 05/29/20   Cuthriell, Charline Bills, PA-C  Multiple Vitamin (MULTIVITAMIN WITH MINERALS) TABS tablet Take 1 tablet by mouth daily with lunch. Centrum Silver Chewable    [provider]  Omega-3 Fatty Acids (FISH OIL) 1000 MG CAPS Take 1,000 mg by mouth daily.     [provider]  ondansetron (ZOFRAN-ODT) 4 MG disintegrating tablet Take 1 tablet (4 mg total) by mouth every 8 (eight) hours as needed for nausea or vomiting. 05/29/20   Cuthriell, Charline Bills, PA-C  Saline 0.9 % AERS Place 1 spray into both nostrils daily as needed (dry nasal passages).    [provider]  SYSTANE BALANCE 0.6 % SOLN Place 1 drop into both eyes 2 (two) times daily as needed (dry/irritated eyes.).    [provider]  vitamin C (ASCORBIC ACID) 250 MG tablet Take 250 mg by mouth daily.    [provider]    Physical Exam: Vitals:   12/28/21 0348 12/28/21 0505 12/28/21 0647  BP: (!) 141/77 125/72 (!) 147/70  Pulse: 92 88 94  Resp: _0 Temp: 98.4 F (36.9 C)    TempSrc: Oral    SpO2: 98% 99% 100%  Weight: 63.5 kg    Height: _1  (1.6 m)     Physical Exam Vitals and nursing note reviewed.  Constitutional:      Appearance: She is  well-developed.  HENT:     Head: Normocephalic and atraumatic.  Eyes:     Extraocular Movements: Extraocular movements intact.  Cardiovascular:     Rate and Rhythm: Normal rate and regular rhythm.  Pulmonary:     Effort: Pulmonary effort is normal.     Breath sounds: Normal breath sounds.  Abdominal:     General: Abdomen is flat. Bowel sounds are decreased.     Palpations: Abdomen is soft.     Tenderness: There is abdominal tenderness in the periumbilical area.  Skin:    General: Skin is warm and dry.  Neurological:     General: No focal deficit present.     Mental Status: She is alert.  Psychiatric:        Mood and Affect: Mood normal.        Behavior: Behavior normal.     Data Reviewed: Relevant notes from primary care and specialist visits, past discharge summaries as available in EHR, including Care Everywhere. Prior diagnostic testing as pertinent to current admission diagnoses Updated medications and problem lists for reconciliation ED course, including vitals, labs, imaging, treatment and response to treatment Triage notes, nursing and pharmacy notes and ED provider's notes Notable results as noted in HPI Labs reviewed.  Sodium 141, potassium 4.0, chloride 107, bicarb 26, glucose 175, BUN 18, creatinine 0.78, calcium 9.6, total protein 7.8, albumin 4.5, AST 24, ALT 19, alk phos 49, total bili 0.2, lipase 24, white count 16.8, hemoglobin 12.6, hematocrit 40.1, platelet count 326 CT scan of abdomen and pelvis showed findings compatible with partial small bowel obstruction. The point of obstruction is felt to likely be at the level of the enteroenteric anastomosis within the right hemiabdomen. Trace free fluid identified within the pelvis and along the inferior margin of the right lobe of liver. Aortic Atherosclerosis . There are no new results to review at this time.  Assessment and Plan: * Partial small bowel obstruction ([place]) Patient with a history of prior abdominal  surgery for complicated diverticulitis which included partial colectomy with anastomosis who  presents to the ER for evaluation of sudden onset periumbilical pain associated with nausea and vomiting. Imaging shows findings compatible with partial small bowel obstruction. The point of obstruction is felt to likely be at the level of the enteroenteric anastomosis within the right hemiabdomen. Keep patient n.p.o. Gastric decompression with NG tube Pain control, IV fluid hydration, IV PPI Consult surgery  S/P closure of ileostomy Treatment as outlined in 1   S/P colectomy Treatment as outlined in 1  IFG (impaired fasting glucose) Obtain hemoglobin A1c levels Check blood sugars every 4 hours  Hypertension Stable Continue metoprolol  GERD (gastroesophageal reflux disease) Stable Continue IV PPI      Advance Care Planning:   Code Status: Full Code   Consults: Surgery  Family Communication: Greater than 50% of time was spent discussing patient's condition and plan of care with her and her husband at the bedside.  All questions and concerns have been addressed.  They verbalized understanding and agree with the plan.   Severity of Illness: The appropriate patient status for this patient is INPATIENT. Inpatient status is judged to be reasonable and necessary in order to provide the required intensity of service to ensure the patient's safety. The patient's presenting symptoms, physical exam findings, and initial radiographic and laboratory data in the context of their chronic comorbidities is felt to place them at high risk for further clinical deterioration. Furthermore, it is not anticipated that the patient will be medically stable for discharge from the hospital within 2 midnights of admission.   * I certify that at the point of admission it is my clinical judgment that the patient will require inpatient hospital care spanning beyond 2 midnights from the point of admission due to high  intensity of service, high risk for further deterioration and high frequency of surveillance required.*  Author: Collier Bullock, MD 12/28/2021 8:57 AM  For on call review [email or web address].

## 2021-12-29 ENCOUNTER — Encounter: Payer: Self-pay | Admitting: Internal Medicine

## 2021-12-29 DIAGNOSIS — K56600 Partial intestinal obstruction, unspecified as to cause: Secondary | ICD-10-CM | POA: Diagnosis not present

## 2021-12-29 LAB — CBC
HCT: 33.9 % — ABNORMAL LOW (ref 36.0–46.0)
Hemoglobin: 10.6 g/dL — ABNORMAL LOW (ref 12.0–15.0)
MCH: 26.9 pg (ref 26.0–34.0)
MCHC: 31.3 g/dL (ref 30.0–36.0)
MCV: 86 fL (ref 80.0–100.0)
Platelets: 238 10*3/uL (ref 150–400)
RBC: 3.94 MIL/uL (ref 3.87–5.11)
RDW: 14.3 % (ref 11.5–15.5)
WBC: 7.5 10*3/uL (ref 4.0–10.5)
nRBC: 0 % (ref 0.0–0.2)

## 2021-12-29 LAB — GLUCOSE, CAPILLARY
Glucose-Capillary: 102 mg/dL — ABNORMAL HIGH (ref 70–99)
Glucose-Capillary: 98 mg/dL (ref 70–99)
Glucose-Capillary: 94 mg/dL (ref 70–99)

## 2021-12-29 LAB — BASIC METABOLIC PANEL
Anion gap: 3 — ABNORMAL LOW (ref 5–15)
BUN: 11 mg/dL (ref 8–23)
CO2: 27 mmol/L (ref 22–32)
Calcium: 8.6 mg/dL — ABNORMAL LOW (ref 8.9–10.3)
Chloride: 115 mmol/L — ABNORMAL HIGH (ref 98–111)
Creatinine, Ser: 0.48 mg/dL (ref 0.44–1.00)
GFR, Estimated: >60 mL/min (ref >60)
Glucose, Bld: 98 mg/dL (ref 70–99)
Potassium: 3.5 mmol/L (ref 3.5–5.1)
Sodium: 145 mmol/L (ref 135–145)

## 2021-12-29 MED ORDER — PANTOPRAZOLE SODIUM 40 MG PO TBEC
40.0000 mg | DELAYED_RELEASE_TABLET | Freq: Every day | ORAL | Status: DC
  Administered 2021-12-30: 40 mg via ORAL
  Filled 2021-12-29: qty 1

## 2021-12-29 NOTE — Progress Notes (Signed)
Subjective:  CC: Whitney Cooper is a 61 y.o. female  Hospital stay day 1,   partial small bowel obstruction  HPI: No new issues overnight.  Multiple BMs reported after small bowel follow-through.  Feeling well this morning  ROS:  General: Denies weight loss, weight gain, fatigue, fevers, chills, and night sweats. Heart: Denies chest pain, palpitations, racing heart, irregular heartbeat, leg pain or swelling, and decreased activity tolerance. Respiratory: Denies breathing difficulty, shortness of breath, wheezing, cough, and sputum. GI: Denies change in appetite, heartburn, nausea, vomiting, constipation, diarrhea, and blood in stool. GU: Denies difficulty urinating, pain with urinating, urgency, frequency, blood in urine.   Objective:   Temp:  [97.7 F (36.5 C)-98.1 F (36.7 C)] 97.7 F (36.5 C) (06/09 0814) Pulse Rate:  [78-89] 89 (06/09 0814) Resp:  [16-18] 18 (06/09 0405) BP: (130-139)/(60-75) 136/68 (06/09 0814) SpO2:  [98 %-100 %] 100 % (06/09 0814)     Height: '5\' 3"'$  (160 cm) Weight: 63.5 kg BMI (Calculated): 24.81   Intake/Output this shift:   Intake/Output Summary (Last 24 hours) at 12/29/2021 1242 Last data filed at 12/29/2021 1044 Gross per 24 hour  Intake 1476.39 ml  Output 450 ml  Net 1026.39 ml    Constitutional :  alert, cooperative, appears stated age, and no distress  Respiratory:  clear to auscultation bilaterally  Cardiovascular:  regular rate and rhythm  Gastrointestinal: soft, non-tender; bowel sounds normal; no masses,  no organomegaly.   Skin: Cool and moist.   Psychiatric: Normal affect, non-agitated, not confused       LABS:     Latest Ref Rng & Units 12/29/2021    4:51 AM 12/28/2021    3:49 AM 05/29/2020    4:46 PM  CMP  Glucose 70 - 99 mg/dL 98  175  115   BUN 8 - 23 mg/dL '11  18  9   '$ Creatinine 0.44 - 1.00 mg/dL 0.48  0.78  0.73   Sodium 135 - 145 mmol/L 145  141  140   Potassium 3.5 - 5.1 mmol/L 3.5  4.0  4.4   Chloride 98 - 111 mmol/L 115   107  101   CO2 22 - 32 mmol/L '27  26  26   '$ Calcium 8.9 - 10.3 mg/dL 8.6  9.6  9.7   Total Protein 6.5 - 8.1 g/dL  7.8  7.7   Total Bilirubin 0.3 - 1.2 mg/dL  0.2  0.7   Alkaline Phos 38 - 126 U/L  49  54   AST 15 - 41 U/L  24  22   ALT 0 - 44 U/L  19  24       Latest Ref Rng & Units 12/29/2021    4:51 AM 12/28/2021    3:49 AM 05/29/2020    4:46 PM  CBC  WBC 4.0 - 10.5 K/uL 7.5  16.8  15.4   Hemoglobin 12.0 - 15.0 g/dL 10.6  12.6  14.2   Hematocrit 36.0 - 46.0 % 33.9  40.1  42.8   Platelets 150 - 400 K/uL 238  326  329     RADS: CLINICAL DATA:  Follow up small bowel obstruction   EXAM: PORTABLE ABDOMEN - 1 VIEW   COMPARISON:  Film from earlier in the same day.   FINDINGS: Gastric catheter remains in the stomach. Previously administered contrast material now lies within the colon consistent with a partial small bowel obstruction. The degree of small-bowel dilatation has improved significantly. No free air is  noted.   IMPRESSION: Improving changes of small-bowel obstruction with previously administered contrast now lying within the colon.     Electronically Signed   By: Inez Catalina M.D.   On: 12/28/2021 23:48 Assessment:   Partial small bowel obstruction.  Resolving.  NG removed today.  Advance diet as tolerated.  Once tolerating regular diet, okay to discharge.  labs/images/medications/previous chart entries reviewed personally and relevant changes/updates noted above.

## 2021-12-29 NOTE — Progress Notes (Signed)
Patient did not want blood sugar taken, states she is not diabetic

## 2021-12-29 NOTE — Progress Notes (Signed)
Uneventful PM shift. No c/o pain, N/V overnight. NG to LIWS with approx 464m of light yellow output. Pt had multiple small to medium loose BMs overnight night with lot of gas passing.

## 2021-12-29 NOTE — Progress Notes (Signed)
Progress Note    Whitney Cooper  EHM:[id number] DOB: [birth year]  DOA: 12/28/2021 PCP: Rusty Aus, MD      Brief Narrative:    Medical records reviewed and are as summarized below:  Whitney Cooper is a 61 y.o. female with medical history significant for diverticulosis, hypertension, dyslipidemia, impaired fasting glycemia, degenerative disc disease, GERD, fibromyalgia, partial colectomy for diverticulitis with reanastomosis, who presented to the hospital with abdominal pain, nausea, vomiting.      Assessment/Plan:   Principal Problem:   Partial small bowel obstruction (H[facility]) Active Problems:   S/P colectomy   S/P closure of ileostomy   IFG (impaired fasting glucose)   Hypertension   GERD (gastroesophageal reflux disease)   Body mass index is 24.8 kg/m.  Partial small bowel obstruction: Improving.  She has been started on clear liquid diet.  Follow-up with general surgeon.  History of colectomy and history of closure of ileostomy.  Other comorbidities include hypertension, GERD, impaired fasting glycemia.  Diet Order             Diet regular Room service appropriate? Yes; Fluid consistency: Thin  Diet effective now                            Consultants: General surgeon  Procedures: None    Medications:    metoprolol succinate  25 mg Oral Daily   [START ON 12/30/2021] pantoprazole  40 mg Oral Daily   rosuvastatin  5 mg Oral QPM   Continuous Infusions:   Anti-infectives (From admission, onward)    None              Family Communication/Anticipated D/C date and plan/Code Status   DVT prophylaxis:      Code Status: Full Code  Family Communication: Plan discussed with her sister at the bedside Disposition Plan: Plan to discharge home tomorrow   Status is: Inpatient Remains inpatient appropriate because: Small bowel obstruction       Subjective:   Interval events noted.  Abdominal pain is better.  She has  had some watery stools.  Objective:    Vitals:   12/28/21 1611 12/28/21 2054 12/29/21 0405 12/29/21 0814  BP: 137/70 130/60 139/64 136/68  Pulse: 85 79 87 89  Resp: '18 18 18   '$ Temp: 98.1 F (36.7 C) 97.8 F (36.6 C) 98.1 F (36.7 C) 97.7 F (36.5 C)  TempSrc: Oral Oral Oral   SpO2: 100% 100% 100% 100%  Weight:      Height:       No data found.   Intake/Output Summary (Last 24 hours) at 12/29/2021 1524 Last data filed at 12/29/2021 1411 Gross per 24 hour  Intake 1956.39 ml  Output 450 ml  Net 1506.39 ml   Filed Weights   12/28/21 0348  Weight: 63.5 kg    Exam:  GEN: NAD SKIN: No rash EYES: EOMI ENT: MMM CV: RRR PULM: CTA B ABD: soft, ND, NT, +BS CNS: AAO x 3, non focal EXT: No edema or tenderness        Data Reviewed:   I have personally reviewed following labs and imaging studies:  Labs: Labs show the following:   Basic Metabolic Panel: Recent Labs  Lab 12/28/21 0349 12/29/21 0451  NA 141 145  K 4.0 3.5  CL 107 115*  CO2 26 27  GLUCOSE 175* 98  BUN 18 11  CREATININE 0.78 0.48  CALCIUM 9.6 8.6*  GFR Estimated Creatinine Clearance: 64.5 mL/min (by C-G formula based on SCr of 0.48 mg/dL). Liver Function Tests: Recent Labs  Lab 12/28/21 0349  AST 24  ALT 19  ALKPHOS 49  BILITOT 0.2*  PROT 7.8  ALBUMIN 4.4   Recent Labs  Lab 12/28/21 0349  LIPASE 34   No results for input(s): "AMMONIA" in the last 168 hours. Coagulation profile No results for input(s): "INR", "PROTIME" in the last 168 hours.  CBC: Recent Labs  Lab 12/28/21 0349 12/29/21 0451  WBC 16.8* 7.5  NEUTROABS 13.7*  --   HGB 12.6 10.6*  HCT 40.1 33.9*  MCV 85.9 86.0  PLT 326 238   Cardiac Enzymes: No results for input(s): "CKTOTAL", "CKMB", "CKMBINDEX", "TROPONINI" in the last 168 hours. BNP (last 3 results) No results for input(s): "PROBNP" in the last 8760 hours. CBG: Recent Labs  Lab 12/28/21 2010 12/28/21 2358 12/29/21 0354 12/29/21 0728  12/29/21 1143  GLUCAP 78 84 102* 98 94   D-Dimer: No results for input(s): "DDIMER" in the last 72 hours. Hgb A1c: Recent Labs    12/28/21 0949  HGBA1C 5.5   Lipid Profile: No results for input(s): "CHOL", "HDL", "LDLCALC", "TRIG", "CHOLHDL", "LDLDIRECT" in the last 72 hours. Thyroid function studies: No results for input(s): "TSH", "T4TOTAL", "T3FREE", "THYROIDAB" in the last 72 hours.  Invalid input(s): "FREET3" Anemia work up: No results for input(s): "VITAMINB12", "FOLATE", "FERRITIN", "TIBC", "IRON", "RETICCTPCT" in the last 72 hours. Sepsis Labs: Recent Labs  Lab 12/28/21 0349 12/29/21 0451  WBC 16.8* 7.5    Microbiology No results found for this or any previous visit (from the past 240 hour(s)).  Procedures and diagnostic studies:  DG Abd Portable 1V-Small Bowel Obstruction Protocol-initial, 8 hr delay  Result Date: 12/28/2021 CLINICAL DATA:  Follow up small bowel obstruction EXAM: PORTABLE ABDOMEN - 1 VIEW COMPARISON:  Film from earlier in the same day. FINDINGS: Gastric catheter remains in the stomach. Previously administered contrast material now lies within the colon consistent with a partial small bowel obstruction. The degree of small-bowel dilatation has improved significantly. No free air is noted. IMPRESSION: Improving changes of small-bowel obstruction with previously administered contrast now lying within the colon. Electronically Signed   By: Inez Catalina M.D.   On: 12/28/2021 23:48   DG Abdomen 1 View  Result Date: 12/28/2021 CLINICAL DATA:  Nasogastric tube placement EXAM: ABDOMEN - 1 VIEW COMPARISON:  11/24/2019 FINDINGS: Nasogastric tube enters the stomach with its tip in the mid body. Side hole is also below the diaphragm. Gas pattern otherwise unremarkable. Contrast in the bladder. IMPRESSION: Nasogastric tube well positioned in the stomach. Electronically Signed   By: Nelson Chimes M.D.   On: 12/28/2021 08:11   CT ABDOMEN PELVIS W CONTRAST  Result  Date: 12/28/2021 CLINICAL DATA:  Abdominal pain, nausea and vomiting. EXAM: CT ABDOMEN AND PELVIS WITH CONTRAST TECHNIQUE: Multidetector CT imaging of the abdomen and pelvis was performed using the standard protocol following bolus administration of intravenous contrast. RADIATION DOSE REDUCTION: This exam was performed according to the departmental dose-optimization program which includes automated exposure control, adjustment of the mA and/or kV according to patient size and/or use of iterative reconstruction technique. CONTRAST:  169m OMNIPAQUE IOHEXOL 300 MG/ML  SOLN COMPARISON:  10/25/2021 FINDINGS: Lower chest: No acute abnormality. Hepatobiliary: No focal liver abnormality is seen. No gallstones, gallbladder wall thickening, or biliary dilatation. Pancreas: Unremarkable. No pancreatic ductal dilatation or surrounding inflammatory changes. Spleen: Normal in size without focal abnormality. Adrenals/Urinary Tract: Normal adrenal  glands. No signs of nephrolithiasis or hydronephrosis. Urinary bladder appears normal. Stomach/Bowel: Postoperative changes involving the distal small bowel loops noted with right lower quadrant enteroenteric anastomosis is identified, image 60/2. Dilatation of the mid ileum up to the level of the enteroenteric anastomosis is identified. The dilated distal small bowel loops measure up to 3 cm with several air-fluid levels. Normal caliber small bowel loops distal to the anastomosis identified. Normal caliber colon is noted. Rectosigmoid anastomotic suture chain is identified within the lower pelvis. There are no signs to suggest bowel perforation. Vascular/Lymphatic: Aortic atherosclerosis. No aneurysm. No abdominopelvic adenopathy. Reproductive: Status post hysterectomy. No adnexal masses. Other: Trace free fluid identified within the pelvis and along the inferior margin of the right lobe of liver. No discrete fluid collections identified. Musculoskeletal: No acute or significant osseous  findings. IMPRESSION: 1. Findings compatible with partial small bowel obstruction. The point of obstruction is felt to likely be at the level of the enteroenteric anastomosis within the right hemiabdomen. 2. Trace free fluid identified within the pelvis and along the inferior margin of the right lobe of liver. 3. Aortic Atherosclerosis (ICD10-I70.0). Electronically Signed   By: Kerby Moors M.D.   On: 12/28/2021 06:07               LOS: 1 day   Lisbeth Puller  [facility]   Pager on [email or web address]. If 7PM-7AM, please contact night-coverage at [email or web address]     12/29/2021, 3:24 PM

## 2021-12-30 DIAGNOSIS — K56609 Unspecified intestinal obstruction, unspecified as to partial versus complete obstruction: Secondary | ICD-10-CM

## 2021-12-30 DIAGNOSIS — K56600 Partial intestinal obstruction, unspecified as to cause: Secondary | ICD-10-CM | POA: Diagnosis not present

## 2021-12-30 NOTE — Progress Notes (Signed)
Patient Discharge   Patient discharged home via private vehicle. All PIVs removed without difficulty. Discharge instructions reviewed with verbal understanding.

## 2021-12-30 NOTE — Discharge Summary (Signed)
Physician Discharge Summary   Patient: Whitney Cooper MRN: [id number] DOB: 10-12-1960  Admit date:     12/28/2021  Discharge date: 12/30/21  Discharge Physician: Jennye Boroughs   PCP: Rusty Aus, MD   Recommendations at discharge:   Follow-up with PCP in 1 week  Discharge Diagnoses: Principal Problem:   Partial small bowel obstruction (HCC) Active Problems:   S/P colectomy   S/P closure of ileostomy   IFG (impaired fasting glucose)   Hypertension   GERD (gastroesophageal reflux disease)  Resolved Problems:   * No resolved hospital problems. [facility] Course:  Whitney Cooper is a 61 y.o. female with medical history significant for diverticulosis, hypertension, dyslipidemia, impaired fasting glycemia, degenerative disc disease, GERD, fibromyalgia, partial colectomy for diverticulitis with reanastomosis, who presented to the hospital with abdominal pain, nausea, vomiting.   She was admitted to the hospital for partial small bowel obstruction.  She was treated conservatively with IV fluids, analgesics, antiemetics and NG tube for gastric decompression.  Her condition slowly improved and she tolerated a regular diet without any problems.  She is deemed stable for discharge to home today.       Consultants: General surgeon Procedures performed: None Disposition: Home Diet recommendation:  Discharge Diet Orders (From admission, onward)     Start     Ordered   12/30/21 0000  Diet - low sodium heart healthy        12/30/21 1403           Cardiac diet DISCHARGE MEDICATION: Allergies as of 12/30/2021       Reactions   Baclofen Other (See Comments)   Dizziness   Codeine Other (See Comments)   jittery   Hydrocodone Other (See Comments)   Headache,dizziness   Hydroxyzine    Made her very nervous   Sulfa Antibiotics Rash        Medication List     STOP taking these medications    Co Q-10 100 MG Caps   esomeprazole 40 MG capsule Commonly known as:  NEXIUM   estradiol 0.1 MG/GM vaginal cream Commonly known as: ESTRACE   famotidine 40 MG tablet Commonly known as: PEPCID   Fish Oil 1000 MG Caps   metroNIDAZOLE 500 MG tablet Commonly known as: FLAGYL   ondansetron 4 MG disintegrating tablet Commonly known as: ZOFRAN-ODT   sucralfate 1 g tablet Commonly known as: CARAFATE   Vitamin D3 25 MCG (1000 UT) Caps       TAKE these medications    acetaminophen 500 MG tablet Commonly known as: TYLENOL Take 1,000 mg by mouth 2 (two) times a day.   amitriptyline 10 MG tablet Commonly known as: ELAVIL Take 10 mg by mouth at bedtime.   antiseptic oral rinse Liqd 15 mLs by Mouth Rinse route as needed for dry mouth.   Calcium Carbonate-Vitamin D 600-200 MG-UNIT Tabs Take by mouth.   diazepam 2 MG tablet Commonly known as: Valium Take 1 tablet (2 mg total) by mouth every 12 (twelve) hours as needed for anxiety.   dicyclomine 10 MG capsule Commonly known as: Bentyl Take 1 capsule (10 mg total) by mouth 4 (four) times daily for 14 days.   etodolac 400 MG tablet Commonly known as: LODINE Take 400 mg by mouth 2 (two) times daily.   fluticasone 50 MCG/ACT nasal spray Commonly known as: FLONASE Place 2 sprays into both nostrils daily.   levocetirizine 5 MG tablet Commonly known as: Xyzal Take 1 tablet (5 mg  total) by mouth every evening.   metoprolol succinate 25 MG 24 hr tablet Commonly known as: TOPROL-XL Take 1 tablet (25 mg total) by mouth daily.   multivitamin with minerals Tabs tablet Take 1 tablet by mouth daily with lunch. Centrum Silver Chewable   olmesartan-hydrochlorothiazide 20-12.5 MG tablet Commonly known as: BENICAR HCT Take 1 tablet by mouth daily.   omeprazole 40 MG capsule Commonly known as: PRILOSEC Take 40 mg by mouth in the morning.   rosuvastatin 5 MG tablet Commonly known as: CRESTOR Take 5 mg by mouth daily.   Saline 0.9 % Aers Place 1 spray into both nostrils daily as needed (dry  nasal passages).   Systane Balance 0.6 % Soln Generic drug: Propylene Glycol Place 1 drop into both eyes 2 (two) times daily as needed (dry/irritated eyes.).   Urogesic-Blue 81.6 MG Tabs Take 1 tablet (81.6 mg total) by mouth 4 (four) times daily as needed.   vitamin C 250 MG tablet Commonly known as: ASCORBIC ACID Take 250 mg by mouth daily.        Discharge Exam: Filed Weights   12/28/21 0348  Weight: 63.5 kg   GEN: NAD SKIN: No rash EYES: EOMI ENT: MMM CV: RRR PULM: CTA B ABD: soft, ND, NT, +BS CNS: AAO x 3, non focal EXT: No edema or tenderness   Condition at discharge: good  The results of significant diagnostics from this hospitalization (including imaging, microbiology, ancillary and laboratory) are listed below for reference.   Imaging Studies: DG Abd Portable 1V-Small Bowel Obstruction Protocol-initial, 8 hr delay  Result Date: 12/28/2021 CLINICAL DATA:  Follow up small bowel obstruction EXAM: PORTABLE ABDOMEN - 1 VIEW COMPARISON:  Film from earlier in the same day. FINDINGS: Gastric catheter remains in the stomach. Previously administered contrast material now lies within the colon consistent with a partial small bowel obstruction. The degree of small-bowel dilatation has improved significantly. No free air is noted. IMPRESSION: Improving changes of small-bowel obstruction with previously administered contrast now lying within the colon. Electronically Signed   By: Inez Catalina M.D.   On: 12/28/2021 23:48   DG Abdomen 1 View  Result Date: 12/28/2021 CLINICAL DATA:  Nasogastric tube placement EXAM: ABDOMEN - 1 VIEW COMPARISON:  11/24/2019 FINDINGS: Nasogastric tube enters the stomach with its tip in the mid body. Side hole is also below the diaphragm. Gas pattern otherwise unremarkable. Contrast in the bladder. IMPRESSION: Nasogastric tube well positioned in the stomach. Electronically Signed   By: Nelson Chimes M.D.   On: 12/28/2021 08:11   CT ABDOMEN PELVIS W  CONTRAST  Result Date: 12/28/2021 CLINICAL DATA:  Abdominal pain, nausea and vomiting. EXAM: CT ABDOMEN AND PELVIS WITH CONTRAST TECHNIQUE: Multidetector CT imaging of the abdomen and pelvis was performed using the standard protocol following bolus administration of intravenous contrast. RADIATION DOSE REDUCTION: This exam was performed according to the departmental dose-optimization program which includes automated exposure control, adjustment of the mA and/or kV according to patient size and/or use of iterative reconstruction technique. CONTRAST:  11m OMNIPAQUE IOHEXOL 300 MG/ML  SOLN COMPARISON:  10/25/2021 FINDINGS: Lower chest: No acute abnormality. Hepatobiliary: No focal liver abnormality is seen. No gallstones, gallbladder wall thickening, or biliary dilatation. Pancreas: Unremarkable. No pancreatic ductal dilatation or surrounding inflammatory changes. Spleen: Normal in size without focal abnormality. Adrenals/Urinary Tract: Normal adrenal glands. No signs of nephrolithiasis or hydronephrosis. Urinary bladder appears normal. Stomach/Bowel: Postoperative changes involving the distal small bowel loops noted with right lower quadrant enteroenteric anastomosis is identified,  image 60/2. Dilatation of the mid ileum up to the level of the enteroenteric anastomosis is identified. The dilated distal small bowel loops measure up to 3 cm with several air-fluid levels. Normal caliber small bowel loops distal to the anastomosis identified. Normal caliber colon is noted. Rectosigmoid anastomotic suture chain is identified within the lower pelvis. There are no signs to suggest bowel perforation. Vascular/Lymphatic: Aortic atherosclerosis. No aneurysm. No abdominopelvic adenopathy. Reproductive: Status post hysterectomy. No adnexal masses. Other: Trace free fluid identified within the pelvis and along the inferior margin of the right lobe of liver. No discrete fluid collections identified. Musculoskeletal: No acute or  significant osseous findings. IMPRESSION: 1. Findings compatible with partial small bowel obstruction. The point of obstruction is felt to likely be at the level of the enteroenteric anastomosis within the right hemiabdomen. 2. Trace free fluid identified within the pelvis and along the inferior margin of the right lobe of liver. 3. Aortic Atherosclerosis (ICD10-I70.0). Electronically Signed   By: Kerby Moors M.D.   On: 12/28/2021 06:07    Microbiology: Results for orders placed or performed in visit on 07/06/20  Microscopic Examination     Status: Abnormal   Collection Time: 07/06/20  8:39 AM   Urine  Result Value Ref Range Status   WBC, UA 0-5 0 - 5 /hpf Final   RBC 0-2 0 - 2 /hpf Final   Epithelial Cells (non renal) 0-10 0 - 10 /hpf Final   Renal Epithel, UA 0-10 (A) None seen /hpf Final   Bacteria, UA None seen None seen/Few Final    Labs: CBC: Recent Labs  Lab 12/28/21 0349 12/29/21 0451  WBC 16.8* 7.5  NEUTROABS 13.7*  --   HGB 12.6 10.6*  HCT 40.1 33.9*  MCV 85.9 86.0  PLT 326 409   Basic Metabolic Panel: Recent Labs  Lab 12/28/21 0349 12/29/21 0451  NA 141 145  K 4.0 3.5  CL 107 115*  CO2 26 27  GLUCOSE 175* 98  BUN 18 11  CREATININE 0.78 0.48  CALCIUM 9.6 8.6*   Liver Function Tests: Recent Labs  Lab 12/28/21 0349  AST 24  ALT 19  ALKPHOS 49  BILITOT 0.2*  PROT 7.8  ALBUMIN 4.4   CBG: Recent Labs  Lab 12/28/21 2010 12/28/21 2358 12/29/21 0354 12/29/21 0728 12/29/21 1143  GLUCAP 78 84 102* 98 94    Discharge time spent: greater than 30 minutes.  Signed: Jennye Boroughs, MD [facility] 12/30/2021

## 2021-12-31 NOTE — Progress Notes (Signed)
CC: SBO Subjective: Feeling very well, in good spirits Tolerating diet + Flatus and bms  Objective: Vital signs in last 24 hours:   Last BM Date : 12/29/21  Intake/Output from previous day: No intake/output data recorded. Intake/Output this shift: Total I/O In: 720 [P.O.:720] Out: -   Physical exam: GEN: NAD, alert SKIN: No rash and warm EYES: EOMI PULM: CTA B ABD: soft, ND, NT, +BS CNS: AAO x 3, non focal EXT: No edema or tenderness     Lab Results: CBC  Recent Labs    12/29/21 0451  WBC 7.5  HGB 10.6*  HCT 33.9*  PLT 238   BMET Recent Labs    12/29/21 0451  NA 145  K 3.5  CL 115*  CO2 27  GLUCOSE 98  BUN 11  CREATININE 0.48  CALCIUM 8.6*   PT/INR No results for input(s): "LABPROT", "INR" in the last 72 hours. ABG No results for input(s): "PHART", "HCO3" in the last 72 hours.  Invalid input(s): "PCO2", "PO2"  Studies/Results: No results found.  Anti-infectives: Anti-infectives (From admission, onward)    None       Assessment/Plan: Resolved partial SBO Doing well DC home  No surgical intervention  Caroleen Hamman, MD, [facility]  12/31/2021

## 2022-01-11 ENCOUNTER — Other Ambulatory Visit: Payer: Self-pay | Admitting: Gastroenterology

## 2022-01-11 DIAGNOSIS — Z8719 Personal history of other diseases of the digestive system: Secondary | ICD-10-CM

## 2022-01-29 ENCOUNTER — Ambulatory Visit
Admission: RE | Admit: 2022-01-29 | Discharge: 2022-01-29 | Disposition: A | Discharge status: Home / Self Care | Payer: BC Managed Care – PPO | Source: Ambulatory Visit | Attending: Gastroenterology | Admitting: Gastroenterology

## 2022-01-29 DIAGNOSIS — Z8719 Personal history of other diseases of the digestive system: Secondary | ICD-10-CM | POA: Diagnosis not present

## 2022-01-29 MED ORDER — IOHEXOL 300 MG/ML  SOLN
100.0000 mL | Freq: Once | INTRAMUSCULAR | Status: AC | PRN
Start: 2022-01-29 — End: 2022-01-29
  Administered 2022-01-29: 100 mL via INTRAVENOUS

## 2022-04-06 ENCOUNTER — Other Ambulatory Visit: Payer: Self-pay | Admitting: Internal Medicine

## 2022-04-06 DIAGNOSIS — Z1231 Encounter for screening mammogram for malignant neoplasm of breast: Secondary | ICD-10-CM

## 2022-05-09 ENCOUNTER — Ambulatory Visit
Admission: RE | Admit: 2022-05-09 | Discharge: 2022-05-09 | Disposition: A | Discharge status: Home / Self Care | Payer: BC Managed Care – PPO | Source: Ambulatory Visit | Attending: Internal Medicine | Admitting: Internal Medicine

## 2022-05-09 DIAGNOSIS — Z1231 Encounter for screening mammogram for malignant neoplasm of breast: Secondary | ICD-10-CM | POA: Insufficient documentation

## 2022-09-19 ENCOUNTER — Ambulatory Visit: Payer: BC Managed Care – PPO | Admitting: Urology

## 2022-09-19 ENCOUNTER — Encounter: Payer: Self-pay | Admitting: Urology

## 2022-09-19 VITALS — BP 128/74 | HR 105 | Ht 63.0 in | Wt 140.0 lb

## 2022-09-19 DIAGNOSIS — R350 Frequency of micturition: Secondary | ICD-10-CM

## 2022-09-19 DIAGNOSIS — R3915 Urgency of urination: Secondary | ICD-10-CM

## 2022-09-19 DIAGNOSIS — R300 Dysuria: Secondary | ICD-10-CM

## 2022-09-19 DIAGNOSIS — R310 Gross hematuria: Secondary | ICD-10-CM | POA: Diagnosis not present

## 2022-09-19 DIAGNOSIS — R8281 Pyuria: Secondary | ICD-10-CM

## 2022-09-19 LAB — BLADDER SCAN AMB NON-IMAGING

## 2022-09-19 LAB — URINALYSIS, COMPLETE
Bilirubin, UA: NEGATIVE
Glucose, UA: NEGATIVE
Ketones, UA: NEGATIVE
Nitrite, UA: NEGATIVE
Specific Gravity, UA: 1.01 (ref 1.005–1.030)
Urobilinogen, Ur: 0.2 mg/dL (ref 0.2–1.0)
pH, UA: 6.5 (ref 5.0–7.5)

## 2022-09-19 LAB — MICROSCOPIC EXAMINATION: WBC, UA: >30 /hpf — AB (ref 0–5)

## 2022-09-19 NOTE — Progress Notes (Signed)
09/19/2022 9:14 AM   Whitney Cooper [birth year] [id number]  Referring provider: Frederica Kuster, PA-C [place],  [place] [address]  Chief Complaint  Patient presents with   New Patient (Initial Visit)   Recurrent UTI    HPI: Whitney Cooper is a 62 y.o. female referred for UTI evaluation.  She is an established patient followed since 2016.  She has seen our PAs, Dr. Matilde Sprang and Dr. Erlene Quan.  Her last visit was 07/06/2020 Has been followed for overactive bladder, possible interstitial cystitis.  Cystoscopy 2017 was unremarkable Seen [facility] 08/13/2022 with complaints of frequency and dysuria UA showed 182 RBCs/182 WBCs however urine culture was negative Was treated for UTIs in November 2023 and early January 2024 however no UA or culture documentation available She has had gross hematuria with these episodes Relates to a prior history of stone disease.  CT abdomen pelvis June 2023 showed no urinary tract calculi She was having no symptoms yesterday however today does complain of dysuria.  Her symptoms are worse when she does not stay hydrated. Symptoms have included frequency, urgency, dysuria, urge incontinence and lower abdominal discomfort  PMH: Past Medical History:  Diagnosis Date   Beat, premature ventricular 06/21/2015   Bundle branch block    palpitations a few weeks ago   DDD (degenerative disc disease), cervical    Diverticulitis of colon 07/07/2009   Dry eyes    Essential (primary) hypertension 06/21/2015   Fibromyalgia    GERD (gastroesophageal reflux disease)    Headache    migraine   History of chicken pox    History of kidney stones    Hyperlipidemia    Hypertension    IFG (impaired fasting glucose) 01/24/2015   Lumbar herniated disc    OAB (overactive bladder)    Osteoarthritis    Osteopenia determined by x-ray 02/10/2016   Recurrent UTI    Urinary incontinence    Vertigo     Surgical History: Past Surgical History:   Procedure Laterality Date   COLONOSCOPY WITH PROPOFOL N/A 11/11/2015   Procedure: COLONOSCOPY WITH PROPOFOL;  Surgeon: Lollie Sails, MD;  Location: [facility] ENDOSCOPY;  Service: Endoscopy;  Laterality: N/A;   COLONOSCOPY WITH PROPOFOL N/A 06/24/2018   Procedure: COLONOSCOPY WITH PROPOFOL;  Surgeon: Lollie Sails, MD;  Location: [facility] ENDOSCOPY;  Service: Endoscopy;  Laterality: N/A;   ESOPHAGOGASTRODUODENOSCOPY  11/13/2013   ILEOSTOMY N/A 08/03/2018   Procedure: ILEOSTOMY;  Surgeon: Jules Husbands, MD;  Location: [facility] ORS;  Service: General;  Laterality: N/A;   ILEOSTOMY CLOSURE N/A 12/30/2018   Procedure: ILEOSTOMY TAKEDOWN;  Surgeon: Jules Husbands, MD;  Location: [facility] ORS;  Service: General;  Laterality: N/A;   LAPAROSCOPIC RIGHT COLECTOMY N/A 07/29/2018   Procedure: LAPAROSCOPIC SIGMOID COLECTOMY;  Surgeon: Jules Husbands, MD;  Location: [facility] ORS;  Service: General;  Laterality: N/A;   LAPAROTOMY N/A 08/03/2018   Procedure: EXPLORATORY LAPAROTOMY;  Surgeon: Jules Husbands, MD;  Location: [facility] ORS;  Service: General;  Laterality: N/A;   OOPHORECTOMY     TOTAL ABDOMINAL HYSTERECTOMY  Jan 2012   Complete, due to heavy bleeding and grape-fruit size fibroid     Home Medications:  Allergies as of 09/19/2022       Reactions   Baclofen Other (See Comments)   Dizziness   Codeine Other (See Comments)   jittery   Hydrocodone Other (See Comments)   Headache,dizziness   Hydroxyzine    Made her very nervous   Sulfa  Antibiotics Rash        Medication List        Accurate as of September 19, 2022  9:14 AM. If you have any questions, ask your nurse or doctor.          STOP taking these medications    diazepam 2 MG tablet Commonly known as: Valium Stopped by: Abbie Sons, MD   etodolac 400 MG tablet Commonly known as: LODINE Stopped by: Abbie Sons, MD   Urogesic-Blue 81.6 MG Tabs Stopped by: Abbie Sons, MD       TAKE these medications    acetaminophen 500  MG tablet Commonly known as: TYLENOL Take 1,000 mg by mouth 2 (two) times a day.   amitriptyline 10 MG tablet Commonly known as: ELAVIL Take 10 mg by mouth at bedtime.   antiseptic oral rinse Liqd 15 mLs by Mouth Rinse route as needed for dry mouth.   Calcium Carbonate-Vitamin D 600-200 MG-UNIT Tabs Take by mouth.   dicyclomine 10 MG capsule Commonly known as: Bentyl Take 1 capsule (10 mg total) by mouth 4 (four) times daily for 14 days.   fluticasone 50 MCG/ACT nasal spray Commonly known as: FLONASE Place 2 sprays into both nostrils daily.   levocetirizine 5 MG tablet Commonly known as: Xyzal Take 1 tablet (5 mg total) by mouth every evening.   metoprolol succinate 25 MG 24 hr tablet Commonly known as: TOPROL-XL Take 1 tablet (25 mg total) by mouth daily.   multivitamin with minerals Tabs tablet Take 1 tablet by mouth daily with lunch. Centrum Silver Chewable   olmesartan-hydrochlorothiazide 20-12.5 MG tablet Commonly known as: BENICAR HCT Take 1 tablet by mouth daily.   omeprazole 40 MG capsule Commonly known as: PRILOSEC Take 40 mg by mouth in the morning.   rosuvastatin 5 MG tablet Commonly known as: CRESTOR Take 5 mg by mouth daily.   Saline 0.9 % Aers Place 1 spray into both nostrils daily as needed (dry nasal passages).   Systane Balance 0.6 % Soln Generic drug: Propylene Glycol Place 1 drop into both eyes 2 (two) times daily as needed (dry/irritated eyes.).   vitamin C 250 MG tablet Commonly known as: ASCORBIC ACID Take 250 mg by mouth daily.        Allergies:  Allergies  Allergen Reactions   Baclofen Other (See Comments)    Dizziness   Codeine Other (See Comments)    jittery   Hydrocodone Other (See Comments)    Headache,dizziness   Hydroxyzine     Made her very nervous   Sulfa Antibiotics Rash    Family History: Family History  Problem Relation Age of Onset   Hypertension Mother    Cancer Mother        skin   Heart disease  Father        CHF   Hypertension Father    Stroke Father        mini strokes   Cancer Father        skin   Heart attack Father    Thyroid disease Father    Ovarian cancer Maternal Grandmother 46   Cancer Maternal Grandmother        ovarian/brain   Heart attack Maternal Grandfather    Heart attack Paternal Grandfather    Diabetes Maternal Uncle    Thyroid disease Sister    Breast cancer Sister 10   Kidney disease Paternal Grandmother     Social History:  reports that she has  never smoked. She has never been exposed to tobacco smoke. She has never used smokeless tobacco. She reports that she does not drink alcohol and does not use drugs.   Physical Exam: BP 128/74   Pulse (!) 105   Ht '5\' 3"'$  (1.6 m)   Wt 140 lb (63.5 kg)   BMI 24.80 kg/m   Constitutional:  Alert and oriented, No acute distress. HEENT: [place] AT Respiratory: Normal respiratory effort, no increased work of breathing. Neurologic: Grossly intact, no focal deficits, moving all 4 extremities. Psychiatric: Normal mood and affect.  Laboratory Data:  Urinalysis Dipstick trace blood/2+ leukocytes Microscopy >30 WBC/3-10 RBC   Assessment & Plan:   62 y.o. female with recurrent episodes of frequency, urgency, dysuria and gross hematuria.  UAs have shown RBCs and WBCs with a negative urine culture UA today with pyuria Urine C&S ordered I have recommended further evaluation with a CT urogram and cystoscopy.  The procedures were discussed in detail and she desires to proceed. Trial Gemtesa 75 mg daily for her storage related voiding symptoms.  Samples given   Abbie Sons, MD  [facility] [address] [place], [place] [address] [phone]

## 2022-09-19 NOTE — Patient Instructions (Signed)

## 2022-09-20 ENCOUNTER — Telehealth: Payer: Self-pay | Admitting: Urology

## 2022-09-20 NOTE — Telephone Encounter (Signed)
Pt called asking about meds for infection.  She was seen yesterday and is still burning w/urination and AZO isn't helping.  Stoioff gave her samples of Gemtesa also.

## 2022-09-21 MED ORDER — CEFUROXIME AXETIL 250 MG PO TABS: 250.0000 mg | ORAL_TABLET | Freq: Two times a day (BID) | ORAL | 0 refills | Status: DC

## 2022-09-21 NOTE — Telephone Encounter (Signed)
Called via triage line-  Pt states she feels worse today than Wed at her appt.  Sx- burning with urination- low back, pelvic pain, urgency, and frequency.  Using AZO, Tylenol, heating pad.  Minimal relief.   No fever.  Culture is pending.   Pt would like a ATB- pls advise.

## 2022-09-21 NOTE — Telephone Encounter (Signed)
Culture results will not be back until at least Monday.  Go ahead and send in Rx cefuroxime 250 mg twice daily x 7 days.  Let her know the antibiotic may need to be changed based on culture results.

## 2022-09-21 NOTE — Telephone Encounter (Signed)
Pt aware.   Med erxed.

## 2022-09-25 ENCOUNTER — Telehealth: Payer: Self-pay | Admitting: Family Medicine

## 2022-09-25 LAB — CULTURE, URINE COMPREHENSIVE

## 2022-09-25 NOTE — Telephone Encounter (Signed)
Patient notified and voiced understanding.

## 2022-09-25 NOTE — Telephone Encounter (Signed)
-----   Message from Nori Riis, PA-C sent at 09/25/2022  8:09 AM EST ----- Please let Whitney Cooper know that the antibiotic cefuroxime (Ceftin) 250 mg is the correct antibiotic and that she needs to take all 14 tablets to complete the treatment.

## 2022-10-04 ENCOUNTER — Ambulatory Visit: Payer: BC Managed Care – PPO | Admitting: Physician Assistant

## 2022-10-04 ENCOUNTER — Encounter: Payer: Self-pay | Admitting: Physician Assistant

## 2022-10-04 VITALS — Ht 63.5 in | Wt 140.0 lb

## 2022-10-04 DIAGNOSIS — R300 Dysuria: Secondary | ICD-10-CM

## 2022-10-04 MED ORDER — URO-MP 118 MG PO CAPS: 1.0000 | ORAL_CAPSULE | Freq: Four times a day (QID) | ORAL | 2 refills | Status: DC | PRN

## 2022-10-04 NOTE — Progress Notes (Signed)
10/04/2022 2:59 PM   Whitney Cooper 06-14-1961 [id number]  CC: Chief Complaint  Patient presents with   Medication Reaction          HPI: Whitney Cooper is a 62 y.o. female with PMH rUTI on topical vaginal estrogen cream, and recurrent episodes of urgency, frequency, gross hematuria, and dysuria with negative cultures who presents today for evaluation of recurrent vs persistent UTI.  She saw Dr. Bernardo Heater on 09/19/2022 for evaluation of possible UTI. UA was notable for pyuria and microhematuria. She was scheduled for hematuria workup; CTU scheduled for 3/27 and cysto on 4/4. Urine culture grew 50-100K CFUs/mL pansensitive E coli and she was treated with cefuroxime '250mg'$  BID x7 days.  Today she reports her symptoms completely resolved on antibiotics, and then she traveled out of town earlier this week.  This morning, she woke with a return of her dysuria, urgency, and frequency.  She took Azo, which helped.  She denies gross hematuria.  Notably, she has noticed that similar episodes of dysuria, urgency, and frequency have occurred in the past after longer car rides similar to her ride home from out of town this week.  In-office catheterized UA today positive for trace glucose and nitrites; urine microscopy pan negative. Measured residual 236m.  PMH: Past Medical History:  Diagnosis Date   Beat, premature ventricular 06/21/2015   Bundle branch block    palpitations a few weeks ago   DDD (degenerative disc disease), cervical    Diverticulitis of colon 07/07/2009   Dry eyes    Essential (primary) hypertension 06/21/2015   Fibromyalgia    GERD (gastroesophageal reflux disease)    Headache    migraine   History of chicken pox    History of kidney stones    Hyperlipidemia    Hypertension    IFG (impaired fasting glucose) 01/24/2015   Lumbar herniated disc    OAB (overactive bladder)    Osteoarthritis    Osteopenia determined by x-ray 02/10/2016   Recurrent UTI    Urinary  incontinence    Vertigo     Surgical History: Past Surgical History:  Procedure Laterality Date   COLONOSCOPY WITH PROPOFOL N/A 11/11/2015   Procedure: COLONOSCOPY WITH PROPOFOL;  Surgeon: MLollie Sails MD;  [facility]  Service: Endoscopy;  Laterality: N/A;   COLONOSCOPY WITH PROPOFOL N/A 06/24/2018   Procedure: COLONOSCOPY WITH PROPOFOL;  Surgeon: SLollie Sails MD;  [facility];  Service: Endoscopy;  Laterality: N/A;   ESOPHAGOGASTRODUODENOSCOPY  11/13/2013   ILEOSTOMY N/A 08/03/2018   Procedure: ILEOSTOMY;  Surgeon: PJules Husbands MD;  Location: [facility] ORS;  Service: General;  Laterality: N/A;   ILEOSTOMY CLOSURE N/A 12/30/2018   Procedure: ILEOSTOMY TAKEDOWN;  Surgeon: PJules Husbands MD;  Location: [facility];  Service: General;  Laterality: N/A;   LAPAROSCOPIC RIGHT COLECTOMY N/A 07/29/2018   Procedure: LAPAROSCOPIC SIGMOID COLECTOMY;  Surgeon: PJules Husbands MD;  Location: [facility];  Service: General;  Laterality: N/A;   LAPAROTOMY N/A 08/03/2018   Procedure: EXPLORATORY LAPAROTOMY;  Surgeon: PJules Husbands MD;  Location: [facility];  Service: General;  Laterality: N/A;   OOPHORECTOMY     TOTAL ABDOMINAL HYSTERECTOMY  Jan 2012   Complete, due to heavy bleeding and grape-fruit size fibroid     Home Medications:  Allergies as of 10/04/2022       Reactions   Baclofen Other (See Comments)   Dizziness   Codeine Other (See Comments)   jittery  Hydrocodone Other (See Comments)   Headache,dizziness   Hydroxyzine    Made her very nervous   Sulfa Antibiotics Rash        Medication List        Accurate as of October 04, 2022  2:59 PM. If you have any questions, ask your nurse or doctor.          acetaminophen 500 MG tablet Commonly known as: TYLENOL Take 1,000 mg by mouth 2 (two) times a day.   amitriptyline 10 MG tablet Commonly known as: ELAVIL Take 10 mg by mouth at bedtime.   antiseptic oral rinse Liqd 15 mLs by Mouth Rinse route  as needed for dry mouth.   Calcium Carbonate-Vitamin D 600-200 MG-UNIT Tabs Take by mouth.   cefUROXime 250 MG tablet Commonly known as: CEFTIN Take 1 tablet (250 mg total) by mouth 2 (two) times daily with a meal.   dicyclomine 10 MG capsule Commonly known as: Bentyl Take 1 capsule (10 mg total) by mouth 4 (four) times daily for 14 days.   fluticasone 50 MCG/ACT nasal spray Commonly known as: FLONASE Place 2 sprays into both nostrils daily.   levocetirizine 5 MG tablet Commonly known as: Xyzal Take 1 tablet (5 mg total) by mouth every evening.   metoprolol succinate 25 MG 24 hr tablet Commonly known as: TOPROL-XL Take 1 tablet (25 mg total) by mouth daily.   multivitamin with minerals Tabs tablet Take 1 tablet by mouth daily with lunch. Centrum Silver Chewable   olmesartan-hydrochlorothiazide 20-12.5 MG tablet Commonly known as: BENICAR HCT Take 1 tablet by mouth daily.   omeprazole 40 MG capsule Commonly known as: PRILOSEC Take 40 mg by mouth in the morning.   rosuvastatin 5 MG tablet Commonly known as: CRESTOR Take 5 mg by mouth daily.   Saline 0.9 % Aers Place 1 spray into both nostrils daily as needed (dry nasal passages).   Systane Balance 0.6 % Soln Generic drug: Propylene Glycol Place 1 drop into both eyes 2 (two) times daily as needed (dry/irritated eyes.).   vitamin C 250 MG tablet Commonly known as: ASCORBIC ACID Take 250 mg by mouth daily.        Allergies:  Allergies  Allergen Reactions   Baclofen Other (See Comments)    Dizziness   Codeine Other (See Comments)    jittery   Hydrocodone Other (See Comments)    Headache,dizziness   Hydroxyzine     Made her very nervous   Sulfa Antibiotics Rash    Family History: Family History  Problem Relation Age of Onset   Hypertension Mother    Cancer Mother        skin   Heart disease Father        CHF   Hypertension Father    Stroke Father        mini strokes   Cancer Father        skin    Heart attack Father    Thyroid disease Father    Ovarian cancer Maternal Grandmother 36   Cancer Maternal Grandmother        ovarian/brain   Heart attack Maternal Grandfather    Heart attack Paternal Grandfather    Diabetes Maternal Uncle    Thyroid disease Sister    Breast cancer Sister 84   Kidney disease Paternal Grandmother     Social History:   reports that she has never smoked. She has never been exposed to tobacco smoke. She has never used smokeless  tobacco. She reports that she does not drink alcohol and does not use drugs.  Physical Exam: There were no vitals taken for this visit.  Constitutional:  Alert and oriented, no acute distress, nontoxic appearing HEENT: [place] Cardiovascular: No clubbing, cyanosis, or edema Respiratory: Normal respiratory effort, no increased work of breathing Skin: No rashes, bruises or suspicious lesions Neurologic: Grossly intact, no focal deficits, moving all 4 extremities Psychiatric: Normal mood and affect  Laboratory Data: Results for orders placed or performed in visit on 10/04/22  Mycoplasma / ureaplasma culture   Specimen: Genital   UR  Result Value Ref Range   Ureaplasma urealyticum Comment Negative   Mycoplasma hominis Culture Comment Negative  Microscopic Examination   Urine  Result Value Ref Range   WBC, UA 0-5 0 - 5 /hpf   RBC, Urine 0-2 0 - 2 /hpf   Epithelial Cells (non renal) 0-10 0 - 10 /hpf   Bacteria, UA Few None seen/Few  Urinalysis, Complete  Result Value Ref Range   Specific Gravity, UA <1.005 (L) 1.005 - 1.030   pH, UA 6.0 5.0 - 7.5   Color, UA Yellow Yellow   Appearance Ur Clear Clear   Leukocytes,UA Negative Negative   Protein,UA Negative Negative/Trace   Glucose, UA Trace (A) Negative   Ketones, UA Negative Negative   RBC, UA Negative Negative   Bilirubin, UA Negative Negative   Urobilinogen, Ur 0.2 0.2 - 1.0 mg/dL   Nitrite, UA Positive (A) Negative   Microscopic Examination See below:     Assessment & Plan:   1. Dysuria UA today is bland, nitrites false positive in the setting of Azo use.  Regardless, we will repeat a urine culture and send for atypicals as well.  I encouraged her to keep plans for upcoming hematuria workup and I am prescribing Uribel for symptom control in the interim.  She expressed understanding. - Urinalysis, Complete - CULTURE, URINE COMPREHENSIVE - Mycoplasma / ureaplasma culture - Meth-Hyo-M Bl-Na Phos-Ph Sal (URO-MP) 118 MG CAPS; Take 1 capsule (118 mg total) by mouth every 6 (six) hours as needed.  Dispense: 120 capsule; Refill: 2   Return for Keep plans for upcoming CT scan and cystoscopy.  Debroah Loop, PA-C  [facility] [address] [place], [place] [address] [phone]

## 2022-10-05 LAB — MICROSCOPIC EXAMINATION

## 2022-10-05 LAB — URINALYSIS, COMPLETE
Bilirubin, UA: NEGATIVE
Ketones, UA: NEGATIVE
Leukocytes,UA: NEGATIVE
Nitrite, UA: POSITIVE — AB
Protein,UA: NEGATIVE
RBC, UA: NEGATIVE
Specific Gravity, UA: <1.005 — ABNORMAL LOW (ref 1.005–1.030)
Urobilinogen, Ur: 0.2 mg/dL (ref 0.2–1.0)
pH, UA: 6 (ref 5.0–7.5)

## 2022-10-09 LAB — CULTURE, URINE COMPREHENSIVE

## 2022-10-10 LAB — MYCOPLASMA / UREAPLASMA CULTURE
Mycoplasma hominis Culture: NEGATIVE
Ureaplasma urealyticum: NEGATIVE

## 2022-10-12 ENCOUNTER — Telehealth: Payer: Self-pay | Admitting: Physician Assistant

## 2022-10-12 NOTE — Telephone Encounter (Signed)
Patient called this morning and reported that she is having frequent urination and burning every time she urinates. She asked if there is any other medication she should take, or anything else she needs to do. I advised her of note sent to her from Sam regarding her culture results. Please advise patient.

## 2022-10-12 NOTE — Telephone Encounter (Signed)
Spoke with patient and advised results, she was not taking Uribel as prescribed but will start. She will call if anything changes.

## 2022-10-12 NOTE — Telephone Encounter (Signed)
Please confirm if she is still taking Gemtesa, Uribel, and amitriptyline. Ok to refill these as needed. Unfortunately I do not have many more options at this time.

## 2022-10-17 ENCOUNTER — Ambulatory Visit
Admission: RE | Admit: 2022-10-17 | Discharge: 2022-10-17 | Disposition: A | Discharge status: Home / Self Care | Payer: BC Managed Care – PPO | Source: Ambulatory Visit | Attending: Urology | Admitting: Urology

## 2022-10-17 DIAGNOSIS — R350 Frequency of micturition: Secondary | ICD-10-CM | POA: Diagnosis present

## 2022-10-17 MED ORDER — IOHEXOL 300 MG/ML  SOLN
125.0000 mL | Freq: Once | INTRAMUSCULAR | Status: AC | PRN
  Administered 2022-10-17: 125 mL via INTRAVENOUS

## 2022-10-25 ENCOUNTER — Ambulatory Visit: Payer: BC Managed Care – PPO | Admitting: Urology

## 2022-10-25 ENCOUNTER — Encounter: Payer: Self-pay | Admitting: Urology

## 2022-10-25 VITALS — BP 125/81 | HR 94 | Ht 65.0 in | Wt 140.0 lb

## 2022-10-25 DIAGNOSIS — R310 Gross hematuria: Secondary | ICD-10-CM

## 2022-10-25 LAB — URINALYSIS, COMPLETE
Bilirubin, UA: NEGATIVE
Glucose, UA: NEGATIVE
Ketones, UA: NEGATIVE
Leukocytes,UA: NEGATIVE
Nitrite, UA: NEGATIVE
Protein,UA: NEGATIVE
RBC, UA: NEGATIVE
Specific Gravity, UA: 1.015 (ref 1.005–1.030)
Urobilinogen, Ur: 0.2 mg/dL (ref 0.2–1.0)
pH, UA: 6.5 (ref 5.0–7.5)

## 2022-10-25 LAB — MICROSCOPIC EXAMINATION

## 2022-10-25 MED ORDER — NITROFURANTOIN MACROCRYSTAL 50 MG PO CAPS: 50.0000 mg | ORAL_CAPSULE | Freq: Every day | ORAL | 2 refills | Status: DC

## 2022-10-25 NOTE — Progress Notes (Signed)
   10/25/22  CC:  Chief Complaint  Patient presents with   Cysto    HPI: Refer to my previous note 09/19/2022.  CTU performed 10/17/2022 showed no upper tract abnormalities.  She did have a positive urine culture for E. coli in early March and was treated with cefuroxime with resolution of her symptoms.  She was seen at an urgent care last week and started on antibiotics for UTI with resolution of her symptoms.  She has a few more days left of her antibiotic therapy.  Urinalysis today is clear.  Did not see any significant improvement on Gemtesa and is no longer taking.  With current antibiotic therapy she is asymptomatic  Blood pressure 125/81, pulse 94, height 5\' 5"  (1.651 m), weight 140 lb (63.5 kg). NED. A&Ox3.   No respiratory distress   Abd soft, NT, ND Normal external genitalia with patent urethral meatus  Cystoscopy Procedure Note  Patient identification was confirmed, informed consent was obtained, and patient was prepped using Betadine solution.  Lidocaine jelly was administered per urethral meatus.    Procedure: - Flexible cystoscope introduced, without any difficulty.   - Thorough search of the bladder revealed:    normal urethral meatus    normal urothelium    no stones    no ulcers     no tumors    no urethral polyps    no trabeculation  - Ureteral orifices were normal in position and appearance.  Post-Procedure: - Patient tolerated the procedure well  Assessment/ Plan: No upper tract abnormalities on CTU Cystoscopy today unremarkable Trial low-dose antibiotic prophylaxis nitrofurantoin 50 mg daily Follow-up 3 months and instructed to call earlier for recurrent symptoms    Abbie Sons, MD

## 2023-01-14 ENCOUNTER — Emergency Department: Payer: BC Managed Care – PPO

## 2023-01-14 ENCOUNTER — Emergency Department
Admission: EM | Admit: 2023-01-14 | Discharge: 2023-01-14 | Disposition: A | Discharge status: Home / Self Care | Payer: BC Managed Care – PPO | Attending: Student in an Organized Health Care Education/Training Program | Admitting: Student in an Organized Health Care Education/Training Program

## 2023-01-14 DIAGNOSIS — R112 Nausea with vomiting, unspecified: Secondary | ICD-10-CM | POA: Insufficient documentation

## 2023-01-14 DIAGNOSIS — R110 Nausea: Secondary | ICD-10-CM

## 2023-01-14 DIAGNOSIS — R42 Dizziness and giddiness: Secondary | ICD-10-CM

## 2023-01-14 DIAGNOSIS — D72829 Elevated white blood cell count, unspecified: Secondary | ICD-10-CM | POA: Diagnosis not present

## 2023-01-14 LAB — CBC WITH DIFFERENTIAL/PLATELET
Abs Immature Granulocytes: 0.07 10*3/uL (ref 0.00–0.07)
Basophils Absolute: 0 10*3/uL (ref 0.0–0.1)
Basophils Relative: 0 %
Eosinophils Absolute: 0 10*3/uL (ref 0.0–0.5)
Eosinophils Relative: 0 %
HCT: 40.3 % (ref 36.0–46.0)
Hemoglobin: 13 g/dL (ref 12.0–15.0)
Immature Granulocytes: 0 %
Lymphocytes Relative: 8 %
Lymphs Abs: 1.2 10*3/uL (ref 0.7–4.0)
MCH: 27.7 pg (ref 26.0–34.0)
MCHC: 32.3 g/dL (ref 30.0–36.0)
MCV: 85.7 fL (ref 80.0–100.0)
Monocytes Absolute: 0.5 10*3/uL (ref 0.1–1.0)
Monocytes Relative: 3 %
Neutro Abs: 14.5 10*3/uL — ABNORMAL HIGH (ref 1.7–7.7)
Neutrophils Relative %: 89 %
Platelets: 313 10*3/uL (ref 150–400)
RBC: 4.7 MIL/uL (ref 3.87–5.11)
RDW: 13.2 % (ref 11.5–15.5)
WBC: 16.3 10*3/uL — ABNORMAL HIGH (ref 4.0–10.5)
nRBC: 0 % (ref 0.0–0.2)

## 2023-01-14 LAB — BASIC METABOLIC PANEL
Anion gap: 8 (ref 5–15)
BUN: 15 mg/dL (ref 8–23)
CO2: 26 mmol/L (ref 22–32)
Calcium: 9.8 mg/dL (ref 8.9–10.3)
Chloride: 105 mmol/L (ref 98–111)
Creatinine, Ser: 0.64 mg/dL (ref 0.44–1.00)
GFR, Estimated: >60 mL/min (ref >60)
Glucose, Bld: 122 mg/dL — ABNORMAL HIGH (ref 70–99)
Potassium: 4.4 mmol/L (ref 3.5–5.1)
Sodium: 139 mmol/L (ref 135–145)

## 2023-01-14 LAB — TROPONIN I (HIGH SENSITIVITY)
Troponin I (High Sensitivity): 3 ng/L (ref <18)
Troponin I (High Sensitivity): 3 ng/L (ref <18)

## 2023-01-14 MED ORDER — ACETAMINOPHEN 325 MG PO TABS
650.0000 mg | ORAL_TABLET | Freq: Once | ORAL | Status: AC
  Administered 2023-01-14: 650 mg via ORAL
  Filled 2023-01-14: qty 2

## 2023-01-14 MED ORDER — ONDANSETRON 4 MG PO TBDP: 4.0000 mg | ORAL_TABLET | Freq: Three times a day (TID) | ORAL | 0 refills | Status: DC | PRN

## 2023-01-14 MED ORDER — SODIUM CHLORIDE 0.9 % IV BOLUS
500.0000 mL | Freq: Once | INTRAVENOUS | Status: AC
  Administered 2023-01-14: 500 mL via INTRAVENOUS

## 2023-01-14 MED ORDER — MECLIZINE HCL 25 MG PO TABS
25.0000 mg | ORAL_TABLET | Freq: Once | ORAL | Status: AC
  Administered 2023-01-14: 25 mg via ORAL
  Filled 2023-01-14: qty 1

## 2023-01-14 MED ORDER — ONDANSETRON 4 MG PO TBDP
4.0000 mg | ORAL_TABLET | Freq: Once | ORAL | Status: AC
  Administered 2023-01-14: 4 mg via ORAL
  Filled 2023-01-14: qty 1

## 2023-01-14 NOTE — ED Triage Notes (Signed)
Patient presents with dizziness that started this morning; She does have a history of vertigo but says this feels different; She is also very nauseated and tried to take a Meclizine this morning but vomited the medication; She says that she has not been getting adequate sleep as her husband recently had neck surgery

## 2023-01-14 NOTE — ED Notes (Signed)
Patient transported to CT 

## 2023-01-14 NOTE — ED Provider Notes (Signed)
[facility] Provider Note    Event Date/Time   First MD Initiated Contact with Patient 01/14/23 2135     (approximate)   History   Dizziness (Patient presents with dizziness that started this morning; She does have a history of vertigo but says this feels different; She is also very nauseated and tried to take a Meclizine this morning but vomited the medication; She says that she has not been getting adequate sleep as her husband recently had neck surgery)   HPI  Whitney Cooper is a 62 y.o. female with a history of vertigo presents to the ER for evaluation of dizziness feeling the room is spinning when she got out of bed this morning.  Has had issues with vertigo from time to time this 1 felt particularly severe and how long it was lasting and she was also having severe nausea and had multiple episodes of vomiting.  She tried meclizine this morning but vomited it up.  She denies any associated numbness or tingling.  No fevers.  Denies any abdominal pain.  Been under quite a bit of stress recently with her husband having recent neck surgery.     Physical Exam   Triage Vital Signs: ED Triage Vitals  Enc Vitals Group     BP 01/14/23 1456 137/73     Pulse Rate 01/14/23 1456 80     Resp 01/14/23 1456 19     Temp 01/14/23 1456 98 F (36.7 C)     Temp Source 01/14/23 2147 Oral     SpO2 01/14/23 1456 100 %     Weight 01/14/23 1438 140 lb (63.5 kg)     Height 01/14/23 1438 5' 3.5" (1.613 m)     Head Circumference --      Peak Flow --      Pain Score 01/14/23 1438 0     Pain Loc --      Pain Edu? --      Excl. in GC? --     Most recent vital signs: Vitals:   01/14/23 2147 01/14/23 2311  BP: (!) 143/68 (!) 140/70  Pulse: 82 80  Resp: 17 16  Temp: 98.1 F (36.7 C) 98.1 F (36.7 C)  SpO2: 99% 99%     Constitutional: Alert  Eyes: Conjunctivae are normal.  Head: Atraumatic. Nose: No congestion/rhinnorhea. Mouth/Throat: Mucous membranes are moist.    Neck: Painless ROM.  Cardiovascular:   Good peripheral circulation. Respiratory: Normal respiratory effort.  No retractions.  Gastrointestinal: Soft and nontender.  Musculoskeletal:  no deformity Neurologic:  MAE spontaneously. No gross focal neurologic deficits are appreciated, benign hints exam.  Skin:  Skin is warm, dry and intact. No rash noted. Psychiatric: Mood and affect are normal. Speech and behavior are normal.    ED Results / Procedures / Treatments   Labs (all labs ordered are listed, but only abnormal results are displayed) Labs Reviewed  CBC WITH DIFFERENTIAL/PLATELET - Abnormal; Notable for the following components:      Result Value   WBC 16.3 (*)    Neutro Abs 14.5 (*)    All other components within normal limits  BASIC METABOLIC PANEL - Abnormal; Notable for the following components:   Glucose, Bld 122 (*)    All other components within normal limits  TROPONIN I (HIGH SENSITIVITY)  TROPONIN I (HIGH SENSITIVITY)     EKG  ED ECG REPORT I, Willy Eddy, the attending physician, personally viewed and interpreted this ECG.   Date:  01/14/2023  EKG Time: 14:37  Rate: 85  Rhythm: sinus  Axis: normal  Intervals: rbbb  ST&T Change: no stemi, no depressions    RADIOLOGY Please see ED Course for my review and interpretation.  I personally reviewed all radiographic images ordered to evaluate for the above acute complaints and reviewed radiology reports and findings.  These findings were personally discussed with the patient.  Please see medical record for radiology report.    PROCEDURES:  Critical Care performed: No  Procedures   MEDICATIONS ORDERED IN ED: Medications  ondansetron (ZOFRAN-ODT) disintegrating tablet 4 mg (4 mg Oral Given 01/14/23 1442)  sodium chloride 0.9 % bolus 500 mL (0 mLs Intravenous Stopped 01/14/23 2311)  meclizine (ANTIVERT) tablet 25 mg (25 mg Oral Given 01/14/23 2217)  acetaminophen (TYLENOL) tablet 650 mg (650 mg Oral  Given 01/14/23 2242)     IMPRESSION / MDM / ASSESSMENT AND PLAN / ED COURSE  I reviewed the triage vital signs and the nursing notes.                              Differential diagnosis includes, but is not limited to, vertigo, dehydration, electrolyte abnormality, mass, CVA, BPD  Patient presenting to the ER for evaluation of symptoms as described above.  Based on symptoms, risk factors and considered above differential, this presenting complaint could reflect a potentially life-threatening illness therefore the patient will be placed on continuous pulse oximetry and telemetry for monitoring.  Laboratory evaluation will be sent to evaluate for the above complaints.  Patient with benign hints exam.  CT imaging on my review and interpretation without evidence of mass or CVA.  Per radiology with no acute abnormalities.  Patient with leukocytosis but likely secondary to stress of vomiting.  Her abdominal exam is soft and benign.  Denies any symptoms of dysuria.  She feels well after Zofran and meclizine.  Do not feel that further diagnostic testing clinically indicated at this time given her known history of vertigo.  She appears well and appropriate for outpatient follow-up.       FINAL CLINICAL IMPRESSION(S) / ED DIAGNOSES   Final diagnoses:  Dizziness  Nausea     Rx / DC Orders   ED Discharge Orders          Ordered    ondansetron (ZOFRAN-ODT) 4 MG disintegrating tablet  Every 8 hours PRN        01/14/23 2230             Note:  This document was prepared using Dragon voice recognition software and may include unintentional dictation errors.    Willy Eddy, MD 01/14/23 2322

## 2023-01-28 ENCOUNTER — Encounter: Payer: Self-pay | Admitting: Urology

## 2023-01-28 ENCOUNTER — Ambulatory Visit: Payer: BC Managed Care – PPO | Admitting: Urology

## 2023-01-28 VITALS — BP 117/75 | HR 88 | Ht 63.5 in | Wt 143.3 lb

## 2023-01-28 DIAGNOSIS — N3281 Overactive bladder: Secondary | ICD-10-CM

## 2023-01-28 DIAGNOSIS — Z87440 Personal history of urinary (tract) infections: Secondary | ICD-10-CM

## 2023-01-28 DIAGNOSIS — N390 Urinary tract infection, site not specified: Secondary | ICD-10-CM

## 2023-01-28 NOTE — Addendum Note (Signed)
Addended by: Consuella Lose on: 01/28/2023 04:30 PM   Modules accepted: Orders

## 2023-01-28 NOTE — Patient Instructions (Signed)

## 2023-01-28 NOTE — Progress Notes (Signed)
I, Whitney Cooper,acting as a scribe for [name], MD.,have documented all relevant documentation on the behalf of [name], MD,as directed by  [name], MD while in the presence of [name], MD.  01/28/2023 10:05 AM   Whitney Cooper 02-06-1961 [id number]  Referring provider: Danella Penton, MD [phone] [facility] [address] [facility] [place],  [place] [address]  Chief Complaint  Patient presents with   Recurrent UTI   Urologic history: 1. Recurrent UTI Cystoscopy 2017 was unremarkable Seen [facility] 08/13/2022 with complaints of frequency and dysuria UA showed 182 RBCs/182 WBCs however urine culture was negative Was treated for UTIs in November 2023 and early January 2024 however no UA or culture documentation available She has had gross hematuria with these episodes Relates to a prior history of stone disease.  CT abdomen pelvis June 2023 showed no urinary tract calculi Symptoms have included frequency, urgency, dysuria, urge incontinence and lower abdominal discomfort  HPI: Whitney Cooper is a 62 y.o. female presents for 3 month follow-up visit.  CT Urogram 10/17/22 showed no upper tract abnormalities and cystoscopy 10/25/22 showed no bladder mucosal abnormalities.  At her last visit she was started on low-dose antibiotic prophylaxis with nitrofurantoin 50 mg daily. Since her last visit, she has been infection-free.  Completed her last nitrofurantoin capsule yesterday.  Does have urinary frequency and urgency- did not see improvement on Gemtesa, though she does have medication left and will retry.   PMH: Past Medical History:  Diagnosis Date   Beat, premature ventricular 06/21/2015   Bundle branch block    palpitations a few weeks ago   DDD (degenerative disc disease), cervical    Diverticulitis of colon 07/07/2009   Dry eyes    Essential (primary) hypertension 06/21/2015   Fibromyalgia    GERD (gastroesophageal reflux  disease)    Headache    migraine   History of chicken pox    History of kidney stones    Hyperlipidemia    Hypertension    IFG (impaired fasting glucose) 01/24/2015   Lumbar herniated disc    OAB (overactive bladder)    Osteoarthritis    Osteopenia determined by x-ray 02/10/2016   Recurrent UTI    Urinary incontinence    Vertigo     Surgical History: Past Surgical History:  Procedure Laterality Date   COLONOSCOPY WITH PROPOFOL N/A 11/11/2015   Procedure: COLONOSCOPY WITH PROPOFOL;  Surgeon: Christena Deem, MD;  Location: [facility] ENDOSCOPY;  Service: Endoscopy;  Laterality: N/A;   COLONOSCOPY WITH PROPOFOL N/A 06/24/2018   Procedure: COLONOSCOPY WITH PROPOFOL;  Surgeon: Christena Deem, MD;  Location: [facility] ENDOSCOPY;  Service: Endoscopy;  Laterality: N/A;   ESOPHAGOGASTRODUODENOSCOPY  11/13/2013   ILEOSTOMY N/A 08/03/2018   Procedure: ILEOSTOMY;  Surgeon: Leafy Ro, MD;  Location: [facility] ORS;  Service: General;  Laterality: N/A;   ILEOSTOMY CLOSURE N/A 12/30/2018   Procedure: ILEOSTOMY TAKEDOWN;  Surgeon: Leafy Ro, MD;  Location: [facility] ORS;  Service: General;  Laterality: N/A;   LAPAROSCOPIC RIGHT COLECTOMY N/A 07/29/2018   Procedure: LAPAROSCOPIC SIGMOID COLECTOMY;  Surgeon: Leafy Ro, MD;  Location: [facility] ORS;  Service: General;  Laterality: N/A;   LAPAROTOMY N/A 08/03/2018   Procedure: EXPLORATORY LAPAROTOMY;  Surgeon: Leafy Ro, MD;  Location: [facility] ORS;  Service: General;  Laterality: N/A;   OOPHORECTOMY     TOTAL ABDOMINAL HYSTERECTOMY  Jan 2012   Complete, due to heavy bleeding and grape-fruit  size fibroid     Home Medications:  Allergies as of 01/28/2023       Reactions   Baclofen Other (See Comments)   Dizziness   Codeine Other (See Comments)   jittery   Hydrocodone Other (See Comments)   Headache,dizziness   Hydroxyzine    Made her very nervous   Sulfa Antibiotics Rash        Medication List        Accurate as of January 28, 2023 10:05 AM. If you  have any questions, ask your nurse or doctor.          STOP taking these medications    Gemtesa 75 MG Tabs Generic drug: Vibegron Stopped by: [name], MD   nitrofurantoin 50 MG capsule Commonly known as: MACRODANTIN Stopped by: [name], MD       TAKE these medications    acetaminophen 500 MG tablet Commonly known as: TYLENOL Take 1,000 mg by mouth 2 (two) times a day.   amitriptyline 10 MG tablet Commonly known as: ELAVIL Take 10 mg by mouth at bedtime.   antiseptic oral rinse Liqd 15 mLs by Mouth Rinse route as needed for dry mouth.   Calcium Carbonate-Vitamin D 600-200 MG-UNIT Tabs Take by mouth.   fluticasone 50 MCG/ACT nasal spray Commonly known as: FLONASE Place 2 sprays into both nostrils daily.   levocetirizine 5 MG tablet Commonly known as: Xyzal Take 1 tablet (5 mg total) by mouth every evening.   metoprolol succinate 25 MG 24 hr tablet Commonly known as: TOPROL-XL Take 1 tablet (25 mg total) by mouth daily.   multivitamin with minerals Tabs tablet Take 1 tablet by mouth daily with lunch. Centrum Silver Chewable   nitrofurantoin (macrocrystal-monohydrate) 100 MG capsule Commonly known as: MACROBID Take 100 mg by mouth every 12 (twelve) hours.   olmesartan-hydrochlorothiazide 20-12.5 MG tablet Commonly known as: BENICAR HCT Take 1 tablet by mouth daily.   omeprazole 40 MG capsule Commonly known as: PRILOSEC Take 40 mg by mouth in the morning.   ondansetron 4 MG disintegrating tablet Commonly known as: ZOFRAN-ODT Take 1 tablet (4 mg total) by mouth every 8 (eight) hours as needed for nausea or vomiting.   rosuvastatin 5 MG tablet Commonly known as: CRESTOR Take 5 mg by mouth daily.   Saline 0.9 % Aers Place 1 spray into both nostrils daily as needed (dry nasal passages).   Systane Balance 0.6 % Soln Generic drug: Propylene Glycol Place 1 drop into both eyes 2 (two) times daily as needed (dry/irritated eyes.).   Uro-MP  118 MG Caps Take 1 capsule (118 mg total) by mouth every 6 (six) hours as needed.   vitamin C 250 MG tablet Commonly known as: ASCORBIC ACID Take 250 mg by mouth daily.        Allergies:  Allergies  Allergen Reactions   Baclofen Other (See Comments)    Dizziness   Codeine Other (See Comments)    jittery   Hydrocodone Other (See Comments)    Headache,dizziness   Hydroxyzine     Made her very nervous   Sulfa Antibiotics Rash    Family History: Family History  Problem Relation Age of Onset   Hypertension Mother    Cancer Mother        skin   Heart disease Father        CHF   Hypertension Father    Stroke Father        mini strokes   Cancer  Father        skin   Heart attack Father    Thyroid disease Father    Ovarian cancer Maternal Grandmother 64   Cancer Maternal Grandmother        ovarian/brain   Heart attack Maternal Grandfather    Heart attack Paternal Grandfather    Diabetes Maternal Uncle    Thyroid disease Sister    Breast cancer Sister 45   Kidney disease Paternal Grandmother     Social History:  reports that she has never smoked. She has never been exposed to tobacco smoke. She has never used smokeless tobacco. She reports that she does not drink alcohol and does not use drugs.   Physical Exam: BP 117/75   Pulse 88   Ht 5' 3.5" (1.613 m)   Wt 143 lb 5 oz (65 kg)   BMI 24.99 kg/m   Constitutional:  Alert and oriented, No acute distress. HEENT: [place] AT Respiratory: Normal respiratory effort, no increased work of breathing. Psychiatric: Normal mood and affect.   Pertinent Imaging: CT was personally reviewed and interpreted.   CT HEMATURIA WORKUP  Narrative CLINICAL DATA:  Recurrent UTIs.  Microscopic hematuria.  EXAM: CT ABDOMEN AND PELVIS WITHOUT AND WITH CONTRAST  TECHNIQUE: Multidetector CT imaging of the abdomen and pelvis was performed following the standard protocol before and following the bolus administration of intravenous  contrast.  RADIATION DOSE REDUCTION: This exam was performed according to the departmental dose-optimization program which includes automated exposure control, adjustment of the mA and/or kV according to patient size and/or use of iterative reconstruction technique.  CONTRAST:  OMNIPAQUE IOHEXOL 300 MG/ML  SOLN  COMPARISON:  01/29/2022  FINDINGS: Lower chest: The lung bases are clear of acute process. No pleural effusion or pulmonary lesions. The heart is normal in size. No pericardial effusion. The distal esophagus and aorta are unremarkable.  Hepatobiliary: No hepatic lesions or intrahepatic biliary dilatation. The gallbladder is unremarkable. No common bile duct dilatation. The portal and hepatic veins are patent.  Pancreas: No mass, inflammation or ductal dilatation.  Spleen: Normal size.  No focal lesions.  Adrenals/Urinary Tract: The adrenal glands are normal.  No renal, ureteral or bladder calculi. Both kidneys demonstrate normal enhancement/perfusion following contrast administration. No renal lesions or findings for pyelonephritis. The delayed images demonstrate normal collecting systems. Both ureters are normal. The bladder is normal. No bladder mass or asymmetric bladder thickening.  Stomach/Bowel: The stomach, duodenum, small bowel and colon are unremarkable. No acute inflammatory process, mass lesions or obstructive findings. The terminal ileum is normal. Low lying cecum deep in the pelvis. The appendix is not identified for certain but no findings to suggest acute appendicitis. Remote surgical changes involving the small and sigmoid colon. No complicating features.  Vascular/Lymphatic: Scattered aortic and iliac artery atherosclerotic calcifications but no aneurysm or dissection. The major venous structures are patent. No mesenteric or retroperitoneal mass or adenopathy.  Reproductive: Surgically absent.  Other: No pelvic mass or adenopathy. No free  pelvic fluid collections. No inguinal mass or adenopathy. No abdominal wall hernia or subcutaneous lesions.  Musculoskeletal: No significant bony findings.  IMPRESSION: 1. No CT findings to account for the patient's microhematuria. No renal, ureteral or bladder calculi or mass. 2. No acute abdominal/pelvic findings, mass lesions or adenopathy. 3. Remote surgical changes involving the small and sigmoid colon. No complicating features.  Aortic Atherosclerosis (ICD10-I70.0).   Electronically Signed By: Rudie Meyer M.D. On: 10/18/2022 11:01  Assessment & Plan:    1.  Recurrent UTI Infection free on low-dose antibiotic prophylaxis, which she has completed.  Monitor for recurrent infections. Urinalysis today pending  2. Overactive bladder Has previously filled anticholinergic medications She will retry Gemtesa We discussed second-line options including Botox, PTNS.  Pelvic floor physical therapy was also discussed. However, she states she did undergo pelvic floor therapy approximately 10 years ago.  I have reviewed the above documentation for accuracy and completeness, and I agree with the above.   [name], MD  [facility] [address], [address] [place], [place] [address] [phone]

## 2023-01-29 LAB — URINALYSIS, COMPLETE
Bilirubin, UA: NEGATIVE
Glucose, UA: NEGATIVE
Ketones, UA: NEGATIVE
Leukocytes,UA: NEGATIVE
Nitrite, UA: NEGATIVE
Protein,UA: NEGATIVE
RBC, UA: NEGATIVE
Specific Gravity, UA: 1.01 (ref 1.005–1.030)
Urobilinogen, Ur: 0.2 mg/dL (ref 0.2–1.0)
pH, UA: 6 (ref 5.0–7.5)

## 2023-01-29 LAB — MICROSCOPIC EXAMINATION: Bacteria, UA: NONE SEEN

## 2023-03-05 ENCOUNTER — Ambulatory Visit: Payer: BC Managed Care – PPO | Admitting: Physician Assistant

## 2023-03-05 ENCOUNTER — Encounter: Payer: Self-pay | Admitting: Physician Assistant

## 2023-03-05 VITALS — BP 126/77 | HR 91 | Ht 63.0 in | Wt 143.0 lb

## 2023-03-05 DIAGNOSIS — N390 Urinary tract infection, site not specified: Secondary | ICD-10-CM

## 2023-03-05 DIAGNOSIS — R3129 Other microscopic hematuria: Secondary | ICD-10-CM | POA: Diagnosis not present

## 2023-03-05 DIAGNOSIS — Z87440 Personal history of urinary (tract) infections: Secondary | ICD-10-CM

## 2023-03-05 DIAGNOSIS — R300 Dysuria: Secondary | ICD-10-CM

## 2023-03-05 DIAGNOSIS — R8281 Pyuria: Secondary | ICD-10-CM | POA: Diagnosis not present

## 2023-03-05 LAB — URINALYSIS, COMPLETE
Bilirubin, UA: NEGATIVE
Glucose, UA: NEGATIVE
Ketones, UA: NEGATIVE
Nitrite, UA: NEGATIVE
Protein,UA: NEGATIVE
Specific Gravity, UA: <1.005 — ABNORMAL LOW (ref 1.005–1.030)
Urobilinogen, Ur: 0.2 mg/dL (ref 0.2–1.0)
pH, UA: 6 (ref 5.0–7.5)

## 2023-03-05 LAB — MICROSCOPIC EXAMINATION

## 2023-03-05 MED ORDER — CEFUROXIME AXETIL 250 MG PO TABS
250.0000 mg | ORAL_TABLET | Freq: Two times a day (BID) | ORAL | 0 refills | Status: AC
Start: 2023-03-05 — End: 2023-03-10

## 2023-03-05 MED ORDER — METHENAMINE HIPPURATE 1 G PO TABS
1.0000 g | ORAL_TABLET | Freq: Two times a day (BID) | ORAL | 11 refills | Status: DC
Start: 2023-03-05 — End: 2023-09-04

## 2023-03-05 NOTE — Progress Notes (Signed)
03/05/2023 2:34 PM   Whitney Cooper 08/13/1960 [id number]  CC: Chief Complaint  Patient presents with   Dysuria   HPI: Whitney Cooper is a 62 y.o. female with PMH recurrent UTI previously on suppressive Macrobid who presents today for evaluation of possible UTI.   Today she reports she completed 3 months of suppressive Macrobid about 6 weeks ago.  She developed dysuria 3 to 4 days ago without fever, chills, nausea, or vomiting.  She has been taking Uribel, which helps.  She is unsure if she wants to resume suppressive antibiotics.  In-office UA today positive for trace intact blood and trace leukocytes; urine microscopy with 11-30 WBCs/HPF and 3-10 RBCs/HPF.  PMH: Past Medical History:  Diagnosis Date   Beat, premature ventricular 06/21/2015   Bundle branch block    palpitations a few weeks ago   DDD (degenerative disc disease), cervical    Diverticulitis of colon 07/07/2009   Dry eyes    Essential (primary) hypertension 06/21/2015   Fibromyalgia    GERD (gastroesophageal reflux disease)    Headache    migraine   History of chicken pox    History of kidney stones    Hyperlipidemia    Hypertension    IFG (impaired fasting glucose) 01/24/2015   Lumbar herniated disc    OAB (overactive bladder)    Osteoarthritis    Osteopenia determined by x-ray 02/10/2016   Recurrent UTI    Urinary incontinence    Vertigo     Surgical History: Past Surgical History:  Procedure Laterality Date   COLONOSCOPY WITH PROPOFOL N/A 11/11/2015   Procedure: COLONOSCOPY WITH PROPOFOL;  Surgeon: Christena Deem, MD;  Location[facility] END[facility]COPY;  Service: Endoscopy;  Laterality: N/A;   COLONOSCOPY WITH PROPOFOL N/A 06/24/2018   Procedure: COLONOSCOPY WITH PROPOFOL;  Surgeon: Christena Deem, MD;  Location: [facility] ENDOSCOPY;  Service: Endoscopy;  Laterality: N/A;   ESOPHAGOGASTRODUODENOSCOPY  11/13/2013   ILEOSTOMY N/A 08/03/2018   Procedure: ILEOSTOMY;  Surgeon: Leafy Ro, MD;  Location:  [facility] ORS;  Service: General;  Laterality: N/A;   ILEOSTOMY CLOSURE N/A 12/30/2018   Procedure: ILEOSTOMY TAKEDOWN;  Surgeon: Leafy Ro, MD;  Location: [facility] ORS;  Service: General;  Laterality: N/A;   LAPAROSCOPIC RIGHT COLECTOMY N/A 07/29/2018   Procedure: LAPAROSCOPIC SIGMOID COLECTOMY;  Surgeon: Leafy Ro, MD;  Location: [facility] ORS;  Service: General;  Laterality: N/A;   LAPAROTOMY N/A 08/03/2018   Procedure: EXPLORATORY LAPAROTOMY;  Surgeon: Leafy Ro, MD;  Location: [facility] ORS;  Service: General;  Laterality: N/A;   OOPHORECTOMY     TOTAL ABDOMINAL HYSTERECTOMY  Jan 2012   Complete, due to heavy bleeding and grape-fruit size fibroid     Home Medications:  Allergies as of 03/05/2023       Reactions   Baclofen Other (See Comments)   Dizziness   Codeine Other (See Comments)   jittery   Hydrocodone Other (See Comments)   Headache,dizziness   Hydroxyzine    Made her very nervous   Sulfa Antibiotics Rash        Medication List        Accurate as of March 05, 2023  2:34 PM. If you have any questions, ask your nurse or doctor.          STOP taking these medications    nitrofurantoin (macrocrystal-monohydrate) 100 MG capsule Commonly known as: MACROBID Stopped by: Carman Ching       TAKE these medications    acetaminophen 500  MG tablet Commonly known as: TYLENOL Take 1,000 mg by mouth 2 (two) times a day.   amitriptyline 10 MG tablet Commonly known as: ELAVIL Take 10 mg by mouth at bedtime.   antiseptic oral rinse Liqd 15 mLs by Mouth Rinse route as needed for dry mouth.   Calcium Carbonate-Vitamin D 600-200 MG-UNIT Tabs Take by mouth.   cefUROXime 250 MG tablet Commonly known as: CEFTIN Take 1 tablet (250 mg total) by mouth 2 (two) times daily with a meal for 5 days. Started by: Carman Ching   fluticasone 50 MCG/ACT nasal spray Commonly known as: FLONASE Place 2 sprays into both nostrils daily.   levocetirizine 5 MG  tablet Commonly known as: Xyzal Take 1 tablet (5 mg total) by mouth every evening.   methenamine 1 g tablet Commonly known as: Hiprex Take 1 tablet (1 g total) by mouth 2 (two) times daily with a meal. Started by: Carman Ching   metoprolol succinate 25 MG 24 hr tablet Commonly known as: TOPROL-XL Take 1 tablet (25 mg total) by mouth daily.   multivitamin with minerals Tabs tablet Take 1 tablet by mouth daily with lunch. Centrum Silver Chewable   olmesartan-hydrochlorothiazide 20-12.5 MG tablet Commonly known as: BENICAR HCT Take 1 tablet by mouth daily.   omeprazole 40 MG capsule Commonly known as: PRILOSEC Take 40 mg by mouth in the morning.   ondansetron 4 MG disintegrating tablet Commonly known as: ZOFRAN-ODT Take 1 tablet (4 mg total) by mouth every 8 (eight) hours as needed for nausea or vomiting.   rosuvastatin 5 MG tablet Commonly known as: CRESTOR Take 5 mg by mouth daily.   Saline 0.9 % Aers Place 1 spray into both nostrils daily as needed (dry nasal passages).   Systane Balance 0.6 % Soln Generic drug: Propylene Glycol Place 1 drop into both eyes 2 (two) times daily as needed (dry/irritated eyes.).   Uro-MP 118 MG Caps Take 1 capsule (118 mg total) by mouth every 6 (six) hours as needed.   vitamin C 250 MG tablet Commonly known as: ASCORBIC ACID Take 250 mg by mouth daily.        Allergies:  Allergies  Allergen Reactions   Baclofen Other (See Comments)    Dizziness   Codeine Other (See Comments)    jittery   Hydrocodone Other (See Comments)    Headache,dizziness   Hydroxyzine     Made her very nervous   Sulfa Antibiotics Rash    Family History: Family History  Problem Relation Age of Onset   Hypertension Mother    Cancer Mother        skin   Heart disease Father        CHF   Hypertension Father    Stroke Father        mini strokes   Cancer Father        skin   Heart attack Father    Thyroid disease Father    Ovarian  cancer Maternal Grandmother 38   Cancer Maternal Grandmother        ovarian/brain   Heart attack Maternal Grandfather    Heart attack Paternal Grandfather    Diabetes Maternal Uncle    Thyroid disease Sister    Breast cancer Sister 29   Kidney disease Paternal Grandmother     Social History:   reports that she has never smoked. She has never been exposed to tobacco smoke. She has never used smokeless tobacco. She reports that she does not drink  alcohol and does not use drugs.  Physical Exam: BP 126/77   Pulse 91   Ht 5\' 3"  (1.6 m)   Wt 143 lb (64.9 kg)   BMI 25.33 kg/m   Constitutional:  Alert and oriented, no acute distress, nontoxic appearing HEENT: [place], AT Cardiovascular: No clubbing, cyanosis, or edema Respiratory: Normal respiratory effort, no increased work of breathing Skin: No rashes, bruises or suspicious lesions Neurologic: Grossly intact, no focal deficits, moving all 4 extremities Psychiatric: Normal mood and affect  Laboratory Data: Results for orders placed or performed in visit on 03/05/23  Microscopic Examination   Urine  Result Value Ref Range   WBC, UA 11-30 (A) 0 - 5 /hpf   RBC, Urine 3-10 (A) 0 - 2 /hpf   Epithelial Cells (non renal) 0-10 0 - 10 /hpf   Bacteria, UA Few None seen/Few  Urinalysis, Complete  Result Value Ref Range   Specific Gravity, UA <1.005 (L) 1.005 - 1.030   pH, UA 6.0 5.0 - 7.5   Color, UA Yellow Yellow   Appearance Ur Hazy (A) Clear   Leukocytes,UA Trace (A) Negative   Protein,UA Negative Negative/Trace   Glucose, UA Negative Negative   Ketones, UA Negative Negative   RBC, UA Trace (A) Negative   Bilirubin, UA Negative Negative   Urobilinogen, Ur 0.2 0.2 - 1.0 mg/dL   Nitrite, UA Negative Negative   Microscopic Examination See below:    Assessment & Plan:   1. Recurrent UTI UA today notable for pyuria and microscopic hematuria.  Will start empiric cefuroxime and send for culture for further evaluation.  I offered her  methenamine for UTI prevention as an alternative to suppressive antibiotics and she would like to try this. - Urinalysis, Complete - cefUROXime (CEFTIN) 250 MG tablet; Take 1 tablet (250 mg total) by mouth 2 (two) times daily with a meal for 5 days.  Dispense: 10 tablet; Refill: 0 - methenamine (HIPREX) 1 g tablet; Take 1 tablet (1 g total) by mouth 2 (two) times daily with a meal.  Dispense: 60 tablet; Refill: 11 - CULTURE, URINE COMPREHENSIVE   Return if symptoms worsen or fail to improve.  Carman Ching, PA-C  [facility] [place] [address] [place], [place] [address] [phone]

## 2023-03-14 ENCOUNTER — Telehealth: Payer: Self-pay

## 2023-03-14 NOTE — Telephone Encounter (Signed)
Patient called stating that she was seen on 03/05/23, took her antibiotic and started Methenamine on 03/10/23. Over night developed symptoms all of a sudden of very frequent urination, discomfort/burning in the vaginal urethra area, burning lingers but is at its worst with urination. NO fever, no blood, no nausea or vomiting, no flank pain or abdominal pain. She would like to know what to do at this time? Also, would like to know if she can take Uro-MP or AZO for the burning?

## 2023-03-15 NOTE — Telephone Encounter (Signed)
Ok to take Azo for burning. Please offer her appt with me at 11:15am on Monday for UA. Otherwise ok to go to Urgent Care over the weekend.

## 2023-03-15 NOTE — Telephone Encounter (Signed)
Pt states she is feeling better, states she will go to urgent care if she needs to. Pt states she will call on Monday if symptoms get worse.

## 2023-04-11 ENCOUNTER — Other Ambulatory Visit: Payer: Self-pay | Admitting: Internal Medicine

## 2023-04-11 DIAGNOSIS — Z1231 Encounter for screening mammogram for malignant neoplasm of breast: Secondary | ICD-10-CM

## 2023-05-07 ENCOUNTER — Other Ambulatory Visit: Payer: Self-pay | Admitting: Internal Medicine

## 2023-05-07 DIAGNOSIS — Z0000 Encounter for general adult medical examination without abnormal findings: Secondary | ICD-10-CM

## 2023-05-07 DIAGNOSIS — E782 Mixed hyperlipidemia: Secondary | ICD-10-CM

## 2023-05-15 ENCOUNTER — Ambulatory Visit
Admission: RE | Admit: 2023-05-15 | Discharge: 2023-05-15 | Disposition: A | Discharge status: Home / Self Care | Payer: BC Managed Care – PPO | Source: Ambulatory Visit | Attending: Internal Medicine | Admitting: Internal Medicine

## 2023-05-15 DIAGNOSIS — Z1231 Encounter for screening mammogram for malignant neoplasm of breast: Secondary | ICD-10-CM | POA: Diagnosis present

## 2023-05-21 ENCOUNTER — Ambulatory Visit
Admission: RE | Admit: 2023-05-21 | Discharge: 2023-05-21 | Disposition: A | Discharge status: Home / Self Care | Payer: Self-pay | Source: Ambulatory Visit | Attending: Internal Medicine | Admitting: Internal Medicine

## 2023-05-21 DIAGNOSIS — E782 Mixed hyperlipidemia: Secondary | ICD-10-CM | POA: Insufficient documentation

## 2023-05-21 DIAGNOSIS — Z0000 Encounter for general adult medical examination without abnormal findings: Secondary | ICD-10-CM | POA: Insufficient documentation

## 2023-07-31 ENCOUNTER — Ambulatory Visit: Payer: BC Managed Care – PPO | Admitting: Urology

## 2023-08-26 ENCOUNTER — Ambulatory Visit: Payer: BC Managed Care – PPO | Admitting: Urology

## 2023-09-04 ENCOUNTER — Encounter: Payer: Self-pay | Admitting: Urology

## 2023-09-04 ENCOUNTER — Ambulatory Visit: Payer: BC Managed Care – PPO | Admitting: Urology

## 2023-09-04 VITALS — BP 138/82 | HR 103 | Ht 64.0 in | Wt 139.0 lb

## 2023-09-04 DIAGNOSIS — Z09 Encounter for follow-up examination after completed treatment for conditions other than malignant neoplasm: Secondary | ICD-10-CM | POA: Diagnosis not present

## 2023-09-04 DIAGNOSIS — Z87440 Personal history of urinary (tract) infections: Secondary | ICD-10-CM

## 2023-09-04 DIAGNOSIS — N390 Urinary tract infection, site not specified: Secondary | ICD-10-CM

## 2023-09-04 LAB — URINALYSIS, COMPLETE
Bilirubin, UA: NEGATIVE
Glucose, UA: NEGATIVE
Ketones, UA: NEGATIVE
Leukocytes,UA: NEGATIVE
Nitrite, UA: NEGATIVE
Protein,UA: NEGATIVE
Specific Gravity, UA: 1.015 (ref 1.005–1.030)
Urobilinogen, Ur: 0.2 mg/dL (ref 0.2–1.0)
pH, UA: 6.5 (ref 5.0–7.5)

## 2023-09-04 LAB — MICROSCOPIC EXAMINATION

## 2023-09-04 NOTE — Progress Notes (Signed)
I, Whitney Cooper, acting as a scribe for Whitney Altes, MD., have documented all relevant documentation on the behalf of Whitney Altes, MD, as directed by Whitney Altes, MD while in the presence of Whitney Altes, MD.  09/04/2023 10:44 AM   [name] [birth year] [id number]  Referring provider: Danella Penton, MD [id number] [facility] [address] [facility] [place],  [place] [address]  Chief Complaint  Patient presents with   Recurrent UTI   Urologic history: 1. Recurrent UTI Cystoscopy 2017 was unremarkable Relates to a prior history of stone disease.  CT abdomen pelvis June 2023 showed no urinary tract calculi A CT urogram March 2024 showed no upper tract abnormalities.  Cystoscopy 10/25/2022 showed no mucosal abnormalities.   HPI: Whitney Cooper is a 63 y.o. female presents for a 6 month follow-up visit.   She did have a PA visit here in early August 2024 with UTI symptoms and pyuria, and was treated with cefuroxime. She was also placed on Hiprex, which she did not tolerate. Since that time, she has done well with cranberry supplements and increased hydration, and has not had recurrent UTI symptoms or documented UTI.  She has no complaints today.    PMH: Past Medical History:  Diagnosis Date   Beat, premature ventricular 06/21/2015   Bundle branch block    palpitations a few weeks ago   DDD (degenerative disc disease), cervical    Diverticulitis of colon 07/07/2009   Dry eyes    Essential (primary) hypertension 06/21/2015   Fibromyalgia    GERD (gastroesophageal reflux disease)    Headache    migraine   History of chicken pox    History of kidney stones    Hyperlipidemia    Hypertension    IFG (impaired fasting glucose) 01/24/2015   Lumbar herniated disc    OAB (overactive bladder)    Osteoarthritis    Osteopenia determined by x-ray 02/10/2016   Recurrent UTI    Urinary incontinence    Vertigo     Surgical History: Past Surgical History:   Procedure Laterality Date   COLONOSCOPY WITH PROPOFOL N/A 11/11/2015   Procedure: COLONOSCOPY WITH PROPOFOL;  Surgeon: Christena Deem, MD;  Location[facility] ENDOSCOPY;  Service: Endoscopy;  Laterality: N/A;   COLONOSCOPY WITH PROPOFOL N/A 06/24/2018   Procedure: COLONOSCOPY WITH PROPOFOL;  Surgeon: Christena Deem, MD;  Location: [facility] ENDOSCOPY;  Service: Endoscopy;  Laterality: N/A;   ESOPHAGOGASTRODUODENOSCOPY  11/13/2013   ILEOSTOMY N/A 08/03/2018   Procedure: ILEOSTOMY;  Surgeon: Leafy Ro, MD;  Location: [facility] ORS;  Service: General;  Laterality: N/A;   ILEOSTOMY CLOSURE N/A 12/30/2018   Procedure: ILEOSTOMY TAKEDOWN;  Surgeon: Leafy Ro, MD;  Location: [facility] ORS;  Service: General;  Laterality: N/A;   LAPAROSCOPIC RIGHT COLECTOMY N/A 07/29/2018   Procedure: LAPAROSCOPIC SIGMOID COLECTOMY;  Surgeon: Leafy Ro, MD;  Location: [facility] ORS;  Service: General;  Laterality: N/A;   LAPAROTOMY N/A 08/03/2018   Procedure: EXPLORATORY LAPAROTOMY;  Surgeon: Leafy Ro, MD;  Location: [facility] ORS;  Service: General;  Laterality: N/A;   OOPHORECTOMY     TOTAL ABDOMINAL HYSTERECTOMY  Jan 2012   Complete, due to heavy bleeding and grape-fruit size fibroid     Home Medications:  Allergies as of 09/04/2023       Reactions   Baclofen Other (See Comments)   Dizziness   Codeine Other (See Comments)   jittery   Hydrocodone Other (See Comments)  Headache,dizziness   Hydroxyzine    Made her very nervous   Sulfa Antibiotics Rash        Medication List        Accurate as of September 04, 2023 10:44 AM. If you have any questions, ask your nurse or doctor.          STOP taking these medications    methenamine 1 g tablet Commonly known as: Hiprex Stopped by: Verna Czech Auriella Wieand   ondansetron 4 MG disintegrating tablet Commonly known as: ZOFRAN-ODT Stopped by: Whitney Cooper   Uro-MP 118 MG Caps Stopped by: Whitney Cooper       TAKE these medications    acetaminophen  500 MG tablet Commonly known as: TYLENOL Take 1,000 mg by mouth 2 (two) times a day.   amitriptyline 10 MG tablet Commonly known as: ELAVIL Take 10 mg by mouth at bedtime.   antiseptic oral rinse Liqd 15 mLs by Mouth Rinse route as needed for dry mouth.   Calcium Carbonate-Vitamin D 600-200 MG-UNIT Tabs Take by mouth.   fluticasone 50 MCG/ACT nasal spray Commonly known as: FLONASE Place 2 sprays into both nostrils daily.   levocetirizine 5 MG tablet Commonly known as: Xyzal Take 1 tablet (5 mg total) by mouth every evening.   metoprolol succinate 25 MG 24 hr tablet Commonly known as: TOPROL-XL Take 1 tablet (25 mg total) by mouth daily.   multivitamin with minerals Tabs tablet Take 1 tablet by mouth daily with lunch. Centrum Silver Chewable   olmesartan-hydrochlorothiazide 20-12.5 MG tablet Commonly known as: BENICAR HCT Take 1 tablet by mouth daily.   omeprazole 40 MG capsule Commonly known as: PRILOSEC Take 40 mg by mouth in the morning.   rosuvastatin 5 MG tablet Commonly known as: CRESTOR Take 5 mg by mouth daily.   Saline 0.9 % Aers Place 1 spray into both nostrils daily as needed (dry nasal passages).   Systane Balance 0.6 % Soln Generic drug: Propylene Glycol Place 1 drop into both eyes 2 (two) times daily as needed (dry/irritated eyes.).   vitamin C 250 MG tablet Commonly known as: ASCORBIC ACID Take 250 mg by mouth daily.        Allergies:  Allergies  Allergen Reactions   Baclofen Other (See Comments)    Dizziness   Codeine Other (See Comments)    jittery   Hydrocodone Other (See Comments)    Headache,dizziness   Hydroxyzine     Made her very nervous   Sulfa Antibiotics Rash    Family History: Family History  Problem Relation Age of Onset   Hypertension Mother    Cancer Mother        skin   Heart disease Father        CHF   Hypertension Father    Stroke Father        mini strokes   Cancer Father        skin   Heart attack  Father    Thyroid disease Father    Ovarian cancer Maternal Grandmother 30   Cancer Maternal Grandmother        ovarian/brain   Heart attack Maternal Grandfather    Heart attack Paternal Grandfather    Diabetes Maternal Uncle    Thyroid disease Sister    Breast cancer Sister 51   Kidney disease Paternal Grandmother     Social History:  reports that she has never smoked. She has never been exposed to tobacco smoke. She has never used  smokeless tobacco. She reports that she does not drink alcohol and does not use drugs.   Physical Exam: BP 138/82   Pulse (!) 103   Ht 5\' 4"  (1.626 m)   Wt 139 lb (63 kg)   BMI 23.86 kg/m   Constitutional:  Alert and oriented, No acute distress. HEENT: [place] AT Respiratory: Normal respiratory effort, no increased work of breathing. Psychiatric: Normal mood and affect.   Urinalysis Dipstick trace blood/microscopy negative    Assessment & Plan:    1. Recurrent UTI  Asymptomatic over the last 5 months and UA today is clear Continue cranberry supplements Follow-up annually or as needed for recurrent UTI symptoms.   I have reviewed the above documentation for accuracy and completeness, and I agree with the above.   Whitney Altes, MD  [facility] [address] [place], [place] [address] [phone]

## 2023-12-06 ENCOUNTER — Inpatient Hospital Stay
Admission: EM | Admit: 2023-12-06 | Discharge: 2023-12-08 | DRG: 390 | Disposition: A | Discharge status: Home / Self Care | Attending: Internal Medicine | Admitting: Internal Medicine

## 2023-12-06 ENCOUNTER — Inpatient Hospital Stay

## 2023-12-06 ENCOUNTER — Emergency Department

## 2023-12-06 ENCOUNTER — Other Ambulatory Visit: Payer: Self-pay

## 2023-12-06 DIAGNOSIS — F418 Other specified anxiety disorders: Secondary | ICD-10-CM | POA: Diagnosis present

## 2023-12-06 DIAGNOSIS — Z9049 Acquired absence of other specified parts of digestive tract: Secondary | ICD-10-CM | POA: Diagnosis not present

## 2023-12-06 DIAGNOSIS — R110 Nausea: Secondary | ICD-10-CM

## 2023-12-06 DIAGNOSIS — F32A Depression, unspecified: Secondary | ICD-10-CM | POA: Diagnosis present

## 2023-12-06 DIAGNOSIS — E785 Hyperlipidemia, unspecified: Secondary | ICD-10-CM | POA: Diagnosis present

## 2023-12-06 DIAGNOSIS — E782 Mixed hyperlipidemia: Secondary | ICD-10-CM

## 2023-12-06 DIAGNOSIS — I10 Essential (primary) hypertension: Secondary | ICD-10-CM | POA: Diagnosis present

## 2023-12-06 DIAGNOSIS — Z882 Allergy status to sulfonamides status: Secondary | ICD-10-CM

## 2023-12-06 DIAGNOSIS — M797 Fibromyalgia: Secondary | ICD-10-CM | POA: Diagnosis present

## 2023-12-06 DIAGNOSIS — K565 Intestinal adhesions [bands], unspecified as to partial versus complete obstruction: Principal | ICD-10-CM | POA: Diagnosis present

## 2023-12-06 DIAGNOSIS — K56609 Unspecified intestinal obstruction, unspecified as to partial versus complete obstruction: Secondary | ICD-10-CM

## 2023-12-06 DIAGNOSIS — F419 Anxiety disorder, unspecified: Secondary | ICD-10-CM | POA: Diagnosis present

## 2023-12-06 DIAGNOSIS — D72829 Elevated white blood cell count, unspecified: Secondary | ICD-10-CM | POA: Diagnosis not present

## 2023-12-06 LAB — CBC WITH DIFFERENTIAL/PLATELET
Abs Immature Granulocytes: 0.05 10*3/uL (ref 0.00–0.07)
Basophils Absolute: 0.1 10*3/uL (ref 0.0–0.1)
Basophils Relative: 1 %
Eosinophils Absolute: 0.2 10*3/uL (ref 0.0–0.5)
Eosinophils Relative: 1 %
HCT: 41.9 % (ref 36.0–46.0)
Hemoglobin: 13.1 g/dL (ref 12.0–15.0)
Immature Granulocytes: 0 %
Lymphocytes Relative: 14 %
Lymphs Abs: 2.1 10*3/uL (ref 0.7–4.0)
MCH: 27.2 pg (ref 26.0–34.0)
MCHC: 31.3 g/dL (ref 30.0–36.0)
MCV: 87.1 fL (ref 80.0–100.0)
Monocytes Absolute: 0.8 10*3/uL (ref 0.1–1.0)
Monocytes Relative: 6 %
Neutro Abs: 11.5 10*3/uL — ABNORMAL HIGH (ref 1.7–7.7)
Neutrophils Relative %: 78 %
Platelets: 321 10*3/uL (ref 150–400)
RBC: 4.81 MIL/uL (ref 3.87–5.11)
RDW: 14.6 % (ref 11.5–15.5)
WBC: 14.7 10*3/uL — ABNORMAL HIGH (ref 4.0–10.5)
nRBC: 0 % (ref 0.0–0.2)

## 2023-12-06 LAB — URINALYSIS, ROUTINE W REFLEX MICROSCOPIC
Bilirubin Urine: NEGATIVE
Glucose, UA: NEGATIVE mg/dL
Hgb urine dipstick: NEGATIVE
Ketones, ur: NEGATIVE mg/dL
Leukocytes,Ua: NEGATIVE
Nitrite: NEGATIVE
Protein, ur: NEGATIVE mg/dL
Specific Gravity, Urine: 1.018 (ref 1.005–1.030)
pH: 5 (ref 5.0–8.0)

## 2023-12-06 LAB — COMPREHENSIVE METABOLIC PANEL WITH GFR
ALT: 17 U/L (ref 0–44)
AST: 23 U/L (ref 15–41)
Albumin: 4.4 g/dL (ref 3.5–5.0)
Alkaline Phosphatase: 52 U/L (ref 38–126)
Anion gap: 12 (ref 5–15)
BUN: 11 mg/dL (ref 8–23)
CO2: 21 mmol/L — ABNORMAL LOW (ref 22–32)
Calcium: 9.5 mg/dL (ref 8.9–10.3)
Chloride: 106 mmol/L (ref 98–111)
Creatinine, Ser: 0.85 mg/dL (ref 0.44–1.00)
GFR, Estimated: >60 mL/min (ref >60)
Glucose, Bld: 111 mg/dL — ABNORMAL HIGH (ref 70–99)
Potassium: 4.9 mmol/L (ref 3.5–5.1)
Sodium: 139 mmol/L (ref 135–145)
Total Bilirubin: 0.6 mg/dL (ref 0.0–1.2)
Total Protein: 7.6 g/dL (ref 6.5–8.1)

## 2023-12-06 LAB — TYPE AND SCREEN
ABO/RH(D): B POS
Antibody Screen: NEGATIVE

## 2023-12-06 LAB — LIPASE, BLOOD: Lipase: 36 U/L (ref 11–51)

## 2023-12-06 LAB — PROTIME-INR
INR: 1.1 (ref 0.8–1.2)
Prothrombin Time: 13.9 s (ref 11.4–15.2)

## 2023-12-06 LAB — LACTIC ACID, PLASMA: Lactic Acid, Venous: 1.8 mmol/L (ref 0.5–1.9)

## 2023-12-06 LAB — APTT: aPTT: 29 s (ref 24–36)

## 2023-12-06 MED ORDER — ROSUVASTATIN CALCIUM 10 MG PO TABS
5.0000 mg | ORAL_TABLET | Freq: Every day | ORAL | Status: DC
  Administered 2023-12-07 – 2023-12-08 (×2): 5 mg via ORAL
  Filled 2023-12-06 (×2): qty 1

## 2023-12-06 MED ORDER — OXYCODONE-ACETAMINOPHEN 5-325 MG PO TABS: 1.0000 | ORAL_TABLET | ORAL | Status: DC | PRN

## 2023-12-06 MED ORDER — SODIUM CHLORIDE 0.9 % IV SOLN: INTRAVENOUS | Status: DC

## 2023-12-06 MED ORDER — CRANBERRY 250 MG PO TABS: ORAL_TABLET | Freq: Two times a day (BID) | ORAL | Status: DC

## 2023-12-06 MED ORDER — AMITRIPTYLINE HCL 10 MG PO TABS
10.0000 mg | ORAL_TABLET | Freq: Every day | ORAL | Status: DC
  Administered 2023-12-06 – 2023-12-07 (×2): 10 mg via ORAL
  Filled 2023-12-06 (×2): qty 1

## 2023-12-06 MED ORDER — PANTOPRAZOLE SODIUM 40 MG PO TBEC
40.0000 mg | DELAYED_RELEASE_TABLET | Freq: Every day | ORAL | Status: DC
  Administered 2023-12-07 – 2023-12-08 (×2): 40 mg via ORAL
  Filled 2023-12-06 (×2): qty 1

## 2023-12-06 MED ORDER — LORATADINE 10 MG PO TABS
10.0000 mg | ORAL_TABLET | Freq: Every day | ORAL | Status: DC
  Administered 2023-12-07 – 2023-12-08 (×2): 10 mg via ORAL
  Filled 2023-12-06 (×2): qty 1

## 2023-12-06 MED ORDER — METOPROLOL SUCCINATE ER 25 MG PO TB24
25.0000 mg | ORAL_TABLET | Freq: Every day | ORAL | Status: DC
  Administered 2023-12-07 – 2023-12-08 (×2): 25 mg via ORAL
  Filled 2023-12-06 (×2): qty 1

## 2023-12-06 MED ORDER — HYDRALAZINE HCL 20 MG/ML IJ SOLN: 5.0000 mg | INTRAMUSCULAR | Status: DC | PRN

## 2023-12-06 MED ORDER — IOHEXOL 300 MG/ML  SOLN
100.0000 mL | Freq: Once | INTRAMUSCULAR | Status: AC | PRN
  Administered 2023-12-06: 100 mL via INTRAVENOUS

## 2023-12-06 MED ORDER — ACETAMINOPHEN 325 MG PO TABS
650.0000 mg | ORAL_TABLET | Freq: Four times a day (QID) | ORAL | Status: DC | PRN
  Administered 2023-12-06 – 2023-12-08 (×3): 650 mg via ORAL
  Filled 2023-12-06 (×3): qty 2

## 2023-12-06 MED ORDER — ONDANSETRON HCL 4 MG/2ML IJ SOLN
4.0000 mg | Freq: Three times a day (TID) | INTRAMUSCULAR | Status: DC | PRN
  Filled 2023-12-06: qty 2

## 2023-12-06 MED ORDER — MORPHINE SULFATE (PF) 4 MG/ML IV SOLN
4.0000 mg | Freq: Once | INTRAVENOUS | Status: AC
  Administered 2023-12-06: 4 mg via INTRAVENOUS
  Filled 2023-12-06: qty 1

## 2023-12-06 MED ORDER — ADULT MULTIVITAMIN W/MINERALS CH: 1.0000 | ORAL_TABLET | Freq: Every day | ORAL | Status: DC

## 2023-12-06 MED ORDER — MORPHINE SULFATE (PF) 2 MG/ML IV SOLN: 2.0000 mg | INTRAVENOUS | Status: DC | PRN

## 2023-12-06 MED ORDER — ONDANSETRON HCL 4 MG/2ML IJ SOLN
4.0000 mg | Freq: Once | INTRAMUSCULAR | Status: AC
  Administered 2023-12-06: 4 mg via INTRAVENOUS
  Filled 2023-12-06: qty 2

## 2023-12-06 MED ORDER — LEVOCETIRIZINE DIHYDROCHLORIDE 5 MG PO TABS: 5.0000 mg | ORAL_TABLET | Freq: Every evening | ORAL | Status: DC

## 2023-12-06 MED ORDER — LACTATED RINGERS IV BOLUS
1000.0000 mL | Freq: Once | INTRAVENOUS | Status: AC
  Administered 2023-12-06: 1000 mL via INTRAVENOUS

## 2023-12-06 NOTE — ED Triage Notes (Signed)
 Pt comes with belly pain, N and V that started at about noon today. Pt states pain that is sharp straight across.

## 2023-12-06 NOTE — ED Notes (Signed)
 See triage notes. Patient c/o abdominal pain after eating lunch today. Patient reports nausea but no vomiting or diarrhea.

## 2023-12-06 NOTE — H&P (Signed)
 History and Physical    Whitney Cooper:[id number] DOB: [birth year] DOA: 12/06/2023  Referring MD/NP/PA:   PCP: Sari Cunning, MD   Patient coming from:  The patient is coming from home.     Chief Complaint: Abdominal pain  HPI: Whitney Cooper is a 63 y.o. female with medical history significant of s/p of colectomy due to diverticulitis, small bowel obstruction, hypertension, hyperlipidemia, depression with anxiety, overactive bladder, UTI, fibromyalgia, kidney stone, who presents with abdominal pain.  Patient states that her abdominal pain started around noon, which is located in the right lower quadrant mainly, sharp, initially 10 out of 10 in severity, currently mild, nonradiating, not aggravated or alleviated by any known factors.  Patient has nausea, no vomiting.  Last bowel movement was yesterday.  No fever or chills.  Patient does not have chest pain, SOB, cough.  Patient has mild palpitation earlier, which has resolved.  No symptoms of UTI.  She states that recently she had sinus infection, took a course of amoxicillin , developed diarrhea, but her diarrhea has resolved now.  Data reviewed independently and ED Course: pt was found to have WBC 14.7, GFR> 60, urinalysis negative except for rare bacteria, temperature normal, blood pressure 166/88, heart rate 88, RR 19, oxygen saturation 100% on room air.  CT of abdomen/pelvis showed small bowel obstruction.  Patient is admitted to MedSurg bed as an inpatient.  Dr. Cornel Diesel of surgery is consulted.  CT of abdomen/pelvis: 1. Small-bowel obstruction with transition point in the right lower quadrant at the small bowel anastomosis. Mesenteric edema is present which can be seen with vascular compromise. 2. Small amount of free fluid in the pelvis and right abdomen. 3. Aortic atherosclerosis.   Aortic Atherosclerosis (ICD10-I70.0).    EKG:  Not done in ED, will get one.      Review of Systems:   General: no fevers, chills, no body  weight gain, has poor appetite, has fatigue HEENT: no blurry vision, hearing changes or sore throat Respiratory: no dyspnea, coughing, wheezing CV: no chest pain, no palpitations GI: has nausea, abdominal pain, no diarrhea, constipation, vomiting,  GU: no dysuria, burning on urination, increased urinary frequency, hematuria  Ext: no leg edema Neuro: no unilateral weakness, numbness, or tingling, no vision change or hearing loss Skin: no rash, no skin tear. MSK: No muscle spasm, no deformity, no limitation of range of movement in spin Heme: No easy bruising.  Travel history: No recent long distant travel.   Allergy:  Allergies  Allergen Reactions   Baclofen Other (See Comments)    Dizziness   Codeine Other (See Comments)    jittery   Hydrocodone  Other (See Comments)    Headache,dizziness   Hydroxyzine      Made her very nervous   Sulfa Antibiotics Rash    Past Medical History:  Diagnosis Date   Beat, premature ventricular 06/21/2015   Bundle branch block    palpitations a few weeks ago   DDD (degenerative disc disease), cervical    Diverticulitis of colon 07/07/2009   Dry eyes    Essential (primary) hypertension 06/21/2015   Fibromyalgia    GERD (gastroesophageal reflux disease)    Headache    migraine   History of chicken pox    History of kidney stones    Hyperlipidemia    Hypertension    IFG (impaired fasting glucose) 01/24/2015   Lumbar herniated disc    OAB (overactive bladder)    Osteoarthritis    Osteopenia determined by x-ray  02/10/2016   Recurrent UTI    Urinary incontinence    Vertigo     Past Surgical History:  Procedure Laterality Date   COLONOSCOPY WITH PROPOFOL  N/A 11/11/2015   Procedure: COLONOSCOPY WITH PROPOFOL ;  Surgeon: Deveron Fly, MD;  Location: [facility] ENDOSCOPY;  Service: Endoscopy;  Laterality: N/A;   COLONOSCOPY WITH PROPOFOL  N/A 06/24/2018   Procedure: COLONOSCOPY WITH PROPOFOL ;  Surgeon: Deveron Fly, MD;  Location: [facility]  ENDOSCOPY;  Service: Endoscopy;  Laterality: N/A;   ESOPHAGOGASTRODUODENOSCOPY  11/13/2013   ILEOSTOMY N/A 08/03/2018   Procedure: ILEOSTOMY;  Surgeon: Alben Alma, MD;  Location: [facility] ORS;  Service: General;  Laterality: N/A;   ILEOSTOMY CLOSURE N/A 12/30/2018   Procedure: ILEOSTOMY TAKEDOWN;  Surgeon: Alben Alma, MD;  Location: [facility] ORS;  Service: General;  Laterality: N/A;   LAPAROSCOPIC RIGHT COLECTOMY N/A 07/29/2018   Procedure: LAPAROSCOPIC SIGMOID COLECTOMY;  Surgeon: Alben Alma, MD;  Location: [facility] ORS;  Service: General;  Laterality: N/A;   LAPAROTOMY N/A 08/03/2018   Procedure: EXPLORATORY LAPAROTOMY;  Surgeon: Alben Alma, MD;  Location: [facility] ORS;  Service: General;  Laterality: N/A;   OOPHORECTOMY     TOTAL ABDOMINAL HYSTERECTOMY  Jan 2012   Complete, due to heavy bleeding and grape-fruit size fibroid     Social History:  reports that she has never smoked. She has never been exposed to tobacco smoke. She has never used smokeless tobacco. She reports that she does not drink alcohol and does not use drugs.  Family History:  Family History  Problem Relation Age of Onset   Hypertension Mother    Cancer Mother        skin   Heart disease Father        CHF   Hypertension Father    Stroke Father        mini strokes   Cancer Father        skin   Heart attack Father    Thyroid  disease Father    Ovarian cancer Maternal Grandmother 4   Cancer Maternal Grandmother        ovarian/brain   Heart attack Maternal Grandfather    Heart attack Paternal Grandfather    Diabetes Maternal Uncle    Thyroid  disease Sister    Breast cancer Sister 41   Kidney disease Paternal Grandmother      Prior to Admission medications   Medication Sig Start Date End Date Taking? Authorizing Provider  acetaminophen  (TYLENOL ) 500 MG tablet Take 1,000 mg by mouth 2 (two) times a day.    [provider]  amitriptyline  (ELAVIL ) 10 MG tablet Take 10 mg by mouth at bedtime.    [provider]  antiseptic oral rinse (BIOTENE) LIQD 15 mLs by Mouth Rinse route as needed for dry mouth.    [provider]  Calcium  Carbonate-Vitamin D  600-200 MG-UNIT TABS Take by mouth.    [provider]  fluticasone  (FLONASE ) 50 MCG/ACT nasal spray Place 2 sprays into both nostrils daily. 04/17/19   Tapia, Leisa, PA-C  levocetirizine (XYZAL ) 5 MG tablet Take 1 tablet (5 mg total) by mouth every evening. 04/17/19   Tapia, Leisa, PA-C  metoprolol  succinate (TOPROL -XL) 25 MG 24 hr tablet Take 1 tablet (25 mg total) by mouth daily. 10/01/18   Lark Plum, MD  Multiple Vitamin (MULTIVITAMIN WITH MINERALS) TABS tablet Take 1 tablet by mouth daily with lunch. Centrum Silver Chewable    [provider]  olmesartan-hydrochlorothiazide (BENICAR HCT) 20-12.5  MG tablet Take 1 tablet by mouth daily.    [provider]  omeprazole (PRILOSEC) 40 MG capsule Take 40 mg by mouth in the morning.    [provider]  rosuvastatin  (CRESTOR ) 5 MG tablet Take 5 mg by mouth daily.    [provider]  Saline 0.9 % AERS Place 1 spray into both nostrils daily as needed (dry nasal passages).    [provider]  SYSTANE BALANCE 0.6 % SOLN Place 1 drop into both eyes 2 (two) times daily as needed (dry/irritated eyes.).    [provider]  vitamin C (ASCORBIC ACID) 250 MG tablet Take 250 mg by mouth daily.    [provider]    Physical Exam: Vitals:   12/06/23 1520 12/06/23 1521  BP:  (!) 166/88  Pulse:  88  Resp:  19  Temp:  98 F (36.7 C)  SpO2:  100%  Weight: 62.6 kg   Height: 5' 3.5" (1.613 m)    General: Not in acute distress HEENT:       Eyes: PERRL, EOMI, no jaundice       ENT: No discharge from the ears and nose, no pharynx injection, no tonsillar enlargement.        Neck: No JVD, no bruit, no mass felt. Heme: No neck lymph node enlargement. Cardiac: S1/S2, RRR, No murmurs, No gallops or rubs. Respiratory: No rales,  wheezing, rhonchi or rubs. GI: Soft, nondistended, has tenderness in right lower quadrant, no rebound pain, no organomegaly, BS present. GU: No hematuria Ext: No pitting leg edema bilaterally. 1+DP/PT pulse bilaterally. Musculoskeletal: No joint deformities, No joint redness or warmth, no limitation of ROM in spin. Skin: No rashes.  Neuro: Alert, oriented X3, cranial nerves II-XII grossly intact, moves all extremities normally.  Psych: Patient is not psychotic, no suicidal or hemocidal ideation.  Labs on Admission: I have personally reviewed following labs and imaging studies  CBC: Recent Labs  Lab 12/06/23 1526  WBC 14.7*  NEUTROABS 11.5*  HGB 13.1  HCT 41.9  MCV 87.1  PLT 321   Basic Metabolic Panel: Recent Labs  Lab 12/06/23 1526  NA 139  K 4.9  CL 106  CO2 21*  GLUCOSE 111*  BUN 11  CREATININE 0.85  CALCIUM  9.5   GFR: Estimated Creatinine Clearance: 56.6 mL/min (by C-G formula based on SCr of 0.85 mg/dL). Liver Function Tests: Recent Labs  Lab 12/06/23 1526  AST 23  ALT 17  ALKPHOS 52  BILITOT 0.6  PROT 7.6  ALBUMIN  4.4   Recent Labs  Lab 12/06/23 1526  LIPASE 36   No results for input(s): "AMMONIA" in the last 168 hours. Coagulation Profile: No results for input(s): "INR", "PROTIME" in the last 168 hours. Cardiac Enzymes: No results for input(s): "CKTOTAL", "CKMB", "CKMBINDEX", "TROPONINI" in the last 168 hours. BNP (last 3 results) No results for input(s): "PROBNP" in the last 8760 hours. HbA1C: No results for input(s): "HGBA1C" in the last 72 hours. CBG: No results for input(s): "GLUCAP" in the last 168 hours. Lipid Profile: No results for input(s): "CHOL", "HDL", "LDLCALC", "TRIG", "CHOLHDL", "LDLDIRECT" in the last 72 hours. Thyroid  Function Tests: No results for input(s): "TSH", "T4TOTAL", "FREET4", "T3FREE", "THYROIDAB" in the last 72 hours. Anemia Panel: No results for input(s): "VITAMINB12", "FOLATE", "FERRITIN", "TIBC", "IRON",  "RETICCTPCT" in the last 72 hours. Urine analysis:    Component Value Date/Time   COLORURINE YELLOW (A) 12/06/2023 1620   APPEARANCEUR CLEAR (A) 12/06/2023 1620   APPEARANCEUR Clear  09/04/2023 0832   LABSPEC 1.018 12/06/2023 1620   LABSPEC 1.004 12/30/2013 2325   PHURINE 5.0 12/06/2023 1620   GLUCOSEU NEGATIVE 12/06/2023 1620   GLUCOSEU Negative 12/30/2013 2325   HGBUR NEGATIVE 12/06/2023 1620   BILIRUBINUR NEGATIVE 12/06/2023 1620   BILIRUBINUR Negative 09/04/2023 0832   BILIRUBINUR Negative 12/30/2013 2325   KETONESUR NEGATIVE 12/06/2023 1620   PROTEINUR NEGATIVE 12/06/2023 1620   UROBILINOGEN negative (A) 04/17/2019 1359   NITRITE NEGATIVE 12/06/2023 1620   LEUKOCYTESUR NEGATIVE 12/06/2023 1620   LEUKOCYTESUR Negative 12/30/2013 2325   Sepsis Labs: @LABRCNTIP (procalcitonin:4,lacticidven:4) )No results found for this or any previous visit (from the past 240 hours).   Radiological Exams on Admission:   Assessment/Plan Principal Problem:   SBO (small bowel obstruction) (HCC) Active Problems:   Leukocytosis   Hypertension   HLD (hyperlipidemia)   Depression with anxiety   Assessment and Plan:  SBO (small bowel obstruction) (HCC): This is recurrent issue.  Patient has history of colectomy, possibly due to adhesion.  Consulted with Dr. Cornel Diesel of surgery.  -Admit to med surg bed -NPO   -NG tube - As needed morphine , Percocet, Tylenol  for pain  -Prn Zofran  prn nausea   -IVF: 1 L LR, NS 75 cc/h -INR/PTT/type & screen -Follow-up general surgeons recommendation  Leukocytosis: WBC 14.7, no fever, likely reactive. -Follow-up CBC  Hypertension -IV hydralazine  as needed - Metoprolol   HLD (hyperlipidemia) -Crestor   Depression with anxiety - Amitriptyline       DVT ppx: SCD  Code Status: Full code    Family Communication:   Yes, patient's husband at bed side.    Disposition Plan:  Anticipate discharge back to previous environment  Consults called:  Dr.  Cornel Diesel of surgery is consulted.  Admission status and Level of care: Med-Surg:    as inpt        Dispo: The patient is from: Home              Anticipated d/c is to: Home              Anticipated d/c date is: 2 days              Patient currently is not medically stable to d/c.    Severity of Illness:  The appropriate patient status for this patient is INPATIENT. Inpatient status is judged to be reasonable and necessary in order to provide the required intensity of service to ensure the patient's safety. The patient's presenting symptoms, physical exam findings, and initial radiographic and laboratory data in the context of their chronic comorbidities is felt to place them at high risk for further clinical deterioration. Furthermore, it is not anticipated that the patient will be medically stable for discharge from the hospital within 2 midnights of admission.   * I certify that at the point of admission it is my clinical judgment that the patient will require inpatient hospital care spanning beyond 2 midnights from the point of admission due to high intensity of service, high risk for further deterioration and high frequency of surveillance required.*       Date of Service 12/06/2023    Fidencio Hue [facility]   If 7PM-7AM, please contact night-coverage [email or web address] 12/06/2023, 10:07 PM

## 2023-12-06 NOTE — ED Provider Notes (Signed)
 [facility] Provider Note    Event Date/Time   First MD Initiated Contact with Patient 12/06/23 1728     (approximate)   History   Abdominal Pain   HPI Whitney Cooper is a 63 y.o. female presenting today for abdominal pain.  Patient states waking up feeling unwell today and then started having sharp abdominal pain in her right lower quadrant which was spreading throughout her abdomen.  Has nausea but no vomiting.  Last bowel movement was yesterday evening.  Not passing gas.  Denies any dysuria.  Little p.o. intake today.  Prior history of colectomy as well as SBO.  Still has her appendix.     Physical Exam   Triage Vital Signs: ED Triage Vitals  Encounter Vitals Group     BP 12/06/23 1521 (!) 166/88     Systolic BP Percentile --      Diastolic BP Percentile --      Pulse Rate 12/06/23 1521 88     Resp 12/06/23 1521 19     Temp 12/06/23 1521 98 F (36.7 C)     Temp src --      SpO2 12/06/23 1521 100 %     Weight 12/06/23 1520 138 lb (62.6 kg)     Height 12/06/23 1520 5' 3.5" (1.613 m)     Head Circumference --      Peak Flow --      Pain Score 12/06/23 1520 10     Pain Loc --      Pain Education --      Exclude from Growth Chart --     Most recent vital signs: Vitals:   12/06/23 1521  BP: (!) 166/88  Pulse: 88  Resp: 19  Temp: 98 F (36.7 C)  SpO2: 100%   Physical Exam: I have reviewed the vital signs and nursing notes. General: Awake, alert, no acute distress.  Nontoxic appearing. Head:  Atraumatic, normocephalic.   ENT:  EOM intact, PERRL. Oral mucosa is pink and moist with no lesions. Neck: Neck is supple with full range of motion, No meningeal signs. Cardiovascular:  RRR, No murmurs. Peripheral pulses palpable and equal bilaterally. Respiratory:  Symmetrical chest wall expansion.  No rhonchi, rales, or wheezes.  Good air movement throughout.  No use of accessory muscles.   Musculoskeletal:  No cyanosis or edema. Moving  extremities with full ROM Abdomen:  Soft, sharp tenderness palpation right lower quadrant, positive Rovsing sign, generalized tenderness elsewhere in the abdomen, nondistended. Neuro:  GCS 15, moving all four extremities, interacting appropriately. Speech clear. Psych:  Calm, appropriate.   Skin:  Warm, dry, no rash.    ED Results / Procedures / Treatments   Labs (all labs ordered are listed, but only abnormal results are displayed) Labs Reviewed  CBC WITH DIFFERENTIAL/PLATELET - Abnormal; Notable for the following components:      Result Value   WBC 14.7 (*)    Neutro Abs 11.5 (*)    All other components within normal limits  COMPREHENSIVE METABOLIC PANEL WITH GFR - Abnormal; Notable for the following components:   CO2 21 (*)    Glucose, Bld 111 (*)    All other components within normal limits  URINALYSIS, ROUTINE W REFLEX MICROSCOPIC - Abnormal; Notable for the following components:   Color, Urine YELLOW (*)    APPearance CLEAR (*)    Bacteria, UA RARE (*)    All other components within normal limits  LIPASE, BLOOD  LACTIC ACID,  PLASMA     EKG    RADIOLOGY Independently interpreted CT abdomen/pelvis with concern for SBO in the right lower quadrant at site of prior anastomosis   PROCEDURES:  Critical Care performed: No  Procedures   MEDICATIONS ORDERED IN ED: Medications  lactated ringers  bolus 1,000 mL (1,000 mLs Intravenous New Bag/Given 12/06/23 1756)  morphine  (PF) 4 MG/ML injection 4 mg (4 mg Intravenous Given 12/06/23 1758)  ondansetron  (ZOFRAN ) injection 4 mg (4 mg Intravenous Given 12/06/23 1757)  iohexol  (OMNIPAQUE ) 300 MG/ML solution 100 mL (100 mLs Intravenous Contrast Given 12/06/23 1811)     IMPRESSION / MDM / ASSESSMENT AND PLAN / ED COURSE  I reviewed the triage vital signs and the nursing notes.                              Differential diagnosis includes, but is not limited to, SBO, kidney stone, appendicitis, pyelonephritis, acute cystitis,  colitis, enteritis  Patient's presentation is most consistent with acute complicated illness / injury requiring diagnostic workup.  Patient is a 63 year old female with prior abdominal surgeries including colectomy as well as SBO presenting today for acute onset right-sided abdominal pain with nausea.  Mostly tender in her right lower quadrant but spreads throughout her abdomen.  Concern for SBO versus appendicitis or other acute intra-abdominal infection.  Vital signs otherwise stable.  Laboratory workup shows slight leukocytosis of 14.7, otherwise CMP and lipase reassuring.  Patient given 1 L fluids, morphine , and Zofran .  CT ordered for further evaluation.  CT abdomen/pelvis shows evidence of SBO in the right lower quadrant at site of prior anastomosis.  Reevaluated patient and only mild tenderness in the right lower quadrant with no pain elsewhere.  She is able to ambulate rather comfortably with no concerning peritonitic features at this time.  General surgery consulted and recommends NG tube and admission to medicine team and will follow along.  Patient admitted to hospitalist for further care. Kept NPO and NG tube placed.  Clinical Course as of 12/06/23 1902  Fri Dec 06, 2023  1845 General surgery consulted [DW]  1900 Reassessed with improvement in abdominal pain.  General surgery recommending NG tube and admission.  Only slight pain in the right lower quadrant and nowhere else at this time.  Patient able to ambulate.  No concerning peritonitic features at this time but will continue to monitor [DW]    Clinical Course User Index [DW] Kandee Orion, MD     FINAL CLINICAL IMPRESSION(S) / ED DIAGNOSES   Final diagnoses:  Small bowel obstruction due to adhesions (HCC)  Nausea     Rx / DC Orders   ED Discharge Orders     None        Note:  This document was prepared using Dragon voice recognition software and may include unintentional dictation errors.   Kandee Orion,  MD 12/06/23 [phone]

## 2023-12-07 ENCOUNTER — Inpatient Hospital Stay

## 2023-12-07 DIAGNOSIS — K56609 Unspecified intestinal obstruction, unspecified as to partial versus complete obstruction: Secondary | ICD-10-CM | POA: Diagnosis not present

## 2023-12-07 DIAGNOSIS — I10 Essential (primary) hypertension: Secondary | ICD-10-CM

## 2023-12-07 DIAGNOSIS — D72829 Elevated white blood cell count, unspecified: Secondary | ICD-10-CM

## 2023-12-07 DIAGNOSIS — F418 Other specified anxiety disorders: Secondary | ICD-10-CM | POA: Diagnosis not present

## 2023-12-07 LAB — CBC
HCT: 34.3 % — ABNORMAL LOW (ref 36.0–46.0)
Hemoglobin: 10.7 g/dL — ABNORMAL LOW (ref 12.0–15.0)
MCH: 25.8 pg — ABNORMAL LOW (ref 26.0–34.0)
MCHC: 31.2 g/dL (ref 30.0–36.0)
MCV: 82.7 fL (ref 80.0–100.0)
Platelets: 303 10*3/uL (ref 150–400)
RBC: 4.15 MIL/uL (ref 3.87–5.11)
RDW: 14.6 % (ref 11.5–15.5)
WBC: 11.4 10*3/uL — ABNORMAL HIGH (ref 4.0–10.5)
nRBC: 0 % (ref 0.0–0.2)

## 2023-12-07 LAB — GLUCOSE, CAPILLARY: Glucose-Capillary: 95 mg/dL (ref 70–99)

## 2023-12-07 LAB — BASIC METABOLIC PANEL WITH GFR
Anion gap: 7 (ref 5–15)
BUN: 11 mg/dL (ref 8–23)
CO2: 25 mmol/L (ref 22–32)
Calcium: 8.6 mg/dL — ABNORMAL LOW (ref 8.9–10.3)
Chloride: 108 mmol/L (ref 98–111)
Creatinine, Ser: 0.65 mg/dL (ref 0.44–1.00)
GFR, Estimated: >60 mL/min (ref >60)
Glucose, Bld: 96 mg/dL (ref 70–99)
Potassium: 4.2 mmol/L (ref 3.5–5.1)
Sodium: 140 mmol/L (ref 135–145)

## 2023-12-07 LAB — HIV ANTIBODY (ROUTINE TESTING W REFLEX): HIV Screen 4th Generation wRfx: NONREACTIVE

## 2023-12-07 MED ORDER — DIATRIZOATE MEGLUMINE & SODIUM 66-10 % PO SOLN
90.0000 mL | Freq: Once | ORAL | Status: AC
  Administered 2023-12-07: 90 mL via NASOGASTRIC

## 2023-12-07 NOTE — Plan of Care (Signed)

## 2023-12-07 NOTE — Consult Note (Signed)
 Patient ID: Whitney Cooper, female   DOB: February 20, 1961, 63 y.o.   MRN: [id number] CC: SBO  History of Present Illness Whitney Cooper is a 63 y.o. female with past medical history as below who presents in consultation for small bowel obstruction.  The patient reports that yesterday she started to develop right lower quadrant pain.  She describes this as sharp and severe.  She said this was also associated with bloating and she felt like she could not get her pants over her swollen abdomen.  She has been nauseated but did not have any episodes of vomiting.  She had a bowel movement yesterday and says that she has passed gas this morning.  Of note, the patient had a small bowel obstruction in June 2023 and she says that the pain is similar to this.  She denies any fevers or chills.  Her surgical history is significant for a sigmoid colectomy complicated by a leak requiring reoperation and a loop ileostomy.  She then underwent an ileostomy reversal.  Past Medical History Past Medical History:  Diagnosis Date   Beat, premature ventricular 06/21/2015   Bundle branch block    palpitations a few weeks ago   DDD (degenerative disc disease), cervical    Diverticulitis of colon 07/07/2009   Dry eyes    Essential (primary) hypertension 06/21/2015   Fibromyalgia    GERD (gastroesophageal reflux disease)    Headache    migraine   History of chicken pox    History of kidney stones    Hyperlipidemia    Hypertension    IFG (impaired fasting glucose) 01/24/2015   Lumbar herniated disc    OAB (overactive bladder)    Osteoarthritis    Osteopenia determined by x-ray 02/10/2016   Recurrent UTI    Urinary incontinence    Vertigo        Past Surgical History:  Procedure Laterality Date   COLONOSCOPY WITH PROPOFOL  N/A 11/11/2015   Procedure: COLONOSCOPY WITH PROPOFOL ;  Surgeon: Deveron Fly, MD;  Location: [facility] ENDOSCOPY;  Service: Endoscopy;  Laterality: N/A;   COLONOSCOPY WITH PROPOFOL  N/A 06/24/2018    Procedure: COLONOSCOPY WITH PROPOFOL ;  Surgeon: Deveron Fly, MD;  Location: [facility] ENDOSCOPY;  Service: Endoscopy;  Laterality: N/A;   ESOPHAGOGASTRODUODENOSCOPY  11/13/2013   ILEOSTOMY N/A 08/03/2018   Procedure: ILEOSTOMY;  Surgeon: Alben Alma, MD;  Location: [facility] ORS;  Service: General;  Laterality: N/A;   ILEOSTOMY CLOSURE N/A 12/30/2018   Procedure: ILEOSTOMY TAKEDOWN;  Surgeon: Alben Alma, MD;  Location: [facility] ORS;  Service: General;  Laterality: N/A;   LAPAROSCOPIC RIGHT COLECTOMY N/A 07/29/2018   Procedure: LAPAROSCOPIC SIGMOID COLECTOMY;  Surgeon: Alben Alma, MD;  Location: [facility] ORS;  Service: General;  Laterality: N/A;   LAPAROTOMY N/A 08/03/2018   Procedure: EXPLORATORY LAPAROTOMY;  Surgeon: Alben Alma, MD;  Location: [facility] ORS;  Service: General;  Laterality: N/A;   OOPHORECTOMY     TOTAL ABDOMINAL HYSTERECTOMY  Jan 2012   Complete, due to heavy bleeding and grape-fruit size fibroid     Allergies  Allergen Reactions   Baclofen Other (See Comments)    Dizziness   Codeine Other (See Comments)    jittery   Hydrocodone  Other (See Comments)    Headache,dizziness   Hydroxyzine      Made her very nervous   Sulfa Antibiotics Rash    Current Facility-Administered Medications  Medication Dose Route Frequency Provider Last Rate Last Admin   0.9 %  sodium chloride  infusion  Intravenous Continuous Niu, Xilin, MD 75 mL/hr at 12/06/23 2220 New Bag at 12/06/23 2220   acetaminophen  (TYLENOL ) tablet 650 mg  650 mg Oral Q6H PRN Niu, Xilin, MD   650 mg at 12/06/23 2226   amitriptyline  (ELAVIL ) tablet 10 mg  10 mg Oral QHS Niu, Xilin, MD   10 mg at 12/06/23 2237   hydrALAZINE  (APRESOLINE ) injection 5 mg  5 mg Intravenous Q2H PRN Niu, Xilin, MD       loratadine  (CLARITIN ) tablet 10 mg  10 mg Oral Daily Alice Innocent, RPH   10 mg at 12/07/23 2536   metoprolol  succinate (TOPROL -XL) 24 hr tablet 25 mg  25 mg Oral Daily Niu, Xilin, MD   25 mg at 12/07/23 0857   morphine  (PF) 2  MG/ML injection 2 mg  2 mg Intravenous Q4H PRN Niu, Xilin, MD       multivitamin with minerals tablet 1 tablet  1 tablet Oral Q lunch Niu, Xilin, MD       ondansetron  (ZOFRAN ) injection 4 mg  4 mg Intravenous Q8H PRN Niu, Xilin, MD       oxyCODONE -acetaminophen  (PERCOCET/ROXICET) 5-325 MG per tablet 1 tablet  1 tablet Oral Q4H PRN Niu, Xilin, MD       pantoprazole  (PROTONIX ) EC tablet 40 mg  40 mg Oral Daily Niu, Xilin, MD   40 mg at 12/07/23 6440   rosuvastatin  (CRESTOR ) tablet 5 mg  5 mg Oral Daily Niu, Xilin, MD   5 mg at 12/07/23 3474    Family History Family History  Problem Relation Age of Onset   Hypertension Mother    Cancer Mother        skin   Heart disease Father        CHF   Hypertension Father    Stroke Father        mini strokes   Cancer Father        skin   Heart attack Father    Thyroid  disease Father    Ovarian cancer Maternal Grandmother 66   Cancer Maternal Grandmother        ovarian/brain   Heart attack Maternal Grandfather    Heart attack Paternal Grandfather    Diabetes Maternal Uncle    Thyroid  disease Sister    Breast cancer Sister 64   Kidney disease Paternal Grandmother        Social History Social History   Tobacco Use   Smoking status: Never    Passive exposure: Never   Smokeless tobacco: Never  Vaping Use   Vaping status: Never Used  Substance Use Topics   Alcohol use: No   Drug use: Never        ROS Full ROS of systems performed and is otherwise negative there than what is stated in the HPI  Physical Exam Blood pressure 138/71, pulse 78, temperature 97.7 F (36.5 C), resp. rate 16, height 5\' 3"  (1.6 m), weight 62.6 kg, SpO2 100%.  On exam patient is alert and oriented x 3, normal work of breathing on room air, regular rate and rhythm, abdomen is soft, slightly distended, very minor tenderness in the right lower quadrant, NG tube in place with just clear output, moving all extremities spontaneously, mood and affect  appropriate Data Reviewed I reviewed her initial labs that showed a leukocytosis of 14.7 that improved with fluid resuscitation to 11.7.  She was also hemoconcentrated with hemoglobin of 13.1 on presentation but that has come down to around her baseline of  10.7 her creatinine is normal at 0.65.  I independently reviewed her CT scan.  There are dilated loops of bowel that looks like there may be a transition point at her previous anastomosis.  There is some edema within the mesentery as well as a small amount of free fluid within the pelvis.  I have personally reviewed the patient's imaging and medical records.    Assessment/Plan    63 year old with past surgical history significant for sigmoid colectomy, loop ileostomy and ileostomy takedown who presents in consultation for small bowel obstruction.  She says this is similar to the episode she had 2 years ago.  This morning her pain is much better and her distention has improved.  She has started to pass flatus.  There is pretty much only spit coming out of her NG tube.  I discussed with her that this is likely secondary to adhesive disease.  I am hopeful that we can manage this nonoperatively.  She is passing gas so I will do a Gastrografin  challenge to ensure that contrast gets to her colon.  If it does we can remove the NG tube and start her on clears later today.  Continue fluid resuscitation and encourage patient to ambulate.      Barrett Lick 12/07/2023, 10:19 AM

## 2023-12-07 NOTE — Progress Notes (Signed)
 [facility]  - [place] [facility]   PATIENT NAME: Whitney Cooper    MR#:  [id number]  DATE OF BIRTH:  06/24/1961  SUBJECTIVE:  patient seen earlier. No family at bedside. NG tube clamped. Patient reports passing gas earlier. Denies any abdominal pain.    VITALS:  Blood pressure 138/71, pulse 78, temperature 97.7 F (36.5 C), resp. rate 16, height 5\' 3"  (1.6 m), weight 62.6 kg, SpO2 100%.  PHYSICAL EXAMINATION:   GENERAL:  63 y.o.-year-old patient with no acute distress. NG+ LUNGS: Normal breath sounds bilaterally, no wheezing CARDIOVASCULAR: S1, S2 normal. No murmur   ABDOMEN: Soft, nontender, nondistended.  EXTREMITIES: No  edema b/l.    NEUROLOGIC: nonfocal  patient is alert and awake    LABORATORY PANEL:  CBC Recent Labs  Lab 12/07/23 0432  WBC 11.4*  HGB 10.7*  HCT 34.3*  PLT 303    Chemistries  Recent Labs  Lab 12/06/23 1526 12/07/23 0432  NA 139 140  K 4.9 4.2  CL 106 108  CO2 21* 25  GLUCOSE 111* 96  BUN 11 11  CREATININE 0.85 0.65  CALCIUM  9.5 8.6*  AST 23  --   ALT 17  --   ALKPHOS 52  --   BILITOT 0.6  --    Cardiac Enzymes No results for input(s): "TROPONINI" in the last 168 hours. RADIOLOGY:  DG Abd 1 View Result Date: 12/06/2023 CLINICAL DATA:  NG tube EXAM: ABDOMEN - 1 VIEW COMPARISON:  Abdominal x-ray 12/28/2021 FINDINGS: Nasogastric tube tip is in the body of the stomach. IMPRESSION: Nasogastric tube tip is in the body of the stomach. Electronically Signed   By: Tyron Gallon M.D.   On: 12/06/2023 20:17   CT ABDOMEN PELVIS W CONTRAST Result Date: 12/06/2023 CLINICAL DATA:  Right lower quadrant pain. EXAM: CT ABDOMEN AND PELVIS WITH CONTRAST TECHNIQUE: Multidetector CT imaging of the abdomen and pelvis was performed using the standard protocol following bolus administration of intravenous contrast. RADIATION DOSE REDUCTION: This exam was performed according to the departmental dose-optimization program which includes  automated exposure control, adjustment of the mA and/or kV according to patient size and/or use of iterative reconstruction technique. CONTRAST:  OMNIPAQUE  IOHEXOL  300 MG/ML  SOLN COMPARISON:  CT abdomen and pelvis 10/17/2022 FINDINGS: Lower chest: No acute abnormality. Hepatobiliary: No focal liver abnormality is seen. No gallstones, gallbladder wall thickening, or biliary dilatation. Pancreas: Unremarkable. No pancreatic ductal dilatation or surrounding inflammatory changes. Spleen: Normal in size without focal abnormality. Adrenals/Urinary Tract: Adrenal glands are unremarkable. Kidneys are normal, without renal calculi, focal lesion, or hydronephrosis. Bladder is unremarkable. Stomach/Bowel: There are dilated small bowel loops in the right abdomen with associated mesenteric edema. Transition point is seen and is small bowel anastomosis in the right lower quadrant image 2/61. There is no pneumatosis or free air there is mild small bowel wall thickening of the affected loops. There is a moderate sized air-fluid level in the stomach. The colon is nondilated. The appendix is within normal limits. Sigmoid colon anastomosis is again seen. Vascular/Lymphatic: Aortic atherosclerosis. No enlarged abdominal or pelvic lymph nodes. Reproductive: Status post hysterectomy. No adnexal masses. Other: There is a small amount of free fluid in the pelvis and right abdomen. There is no free intraperitoneal air or focal abdominal wall hernia. Musculoskeletal: No acute or significant osseous findings. IMPRESSION: 1. Small-bowel obstruction with transition point in the right lower quadrant at the small bowel anastomosis. Mesenteric edema is present which can be seen with  vascular compromise. 2. Small amount of free fluid in the pelvis and right abdomen. 3. Aortic atherosclerosis. Aortic Atherosclerosis (ICD10-I70.0). Electronically Signed   By: Tyron Gallon M.D.   On: 12/06/2023 18:40    Assessment and Plan Whitney Cooper  is a 63 y.o. female with medical history significant of s/p of colectomy due to recurrent diverticulitis, small bowel obstruction, hypertension, hyperlipidemia, depression with anxiety, overactive bladder, UTI, fibromyalgia, kidney stone, who presents with abdominal pain.   CT of abdomen/pelvis: 1. Small-bowel obstruction with transition point in the right lower quadrant at the small bowel anastomosis. Mesenteric edema is present which can be seen with vascular compromise. 2. Small amount of free fluid in the pelvis and right abdomen. 3. Aortic atherosclerosis.  SBO (small bowel obstruction) (HCC): This is recurrent issue.  Patient has history of colectomy, possibly due to adhesion.  Consulted with Dr. Cornel Diesel of surgery.  -NG tube - As needed morphine , Percocet, Tylenol  for pain  -Prn Zofran  prn nausea   -WGN:FAOZ NS -Follow-up general surgeons recommendation --5/17--NG clamped, flatus +   Leukocytosis: WBC 14.7, no fever, likely reactive. -Trending down   Hypertension -IV hydralazine  as needed - Metoprolol    HLD (hyperlipidemia) -Crestor    Depression with anxiety - Amitriptyline    Procedures: Family communication : none Consults : general surgery CODE STATUS: full DVT Prophylaxis : ambulation Level of care: Med-Surg Status is: Inpatient Remains inpatient appropriate because: SBO    TOTAL TIME TAKING CARE OF THIS PATIENT: 35 minutes.  >50% time spent on counselling and coordination of care  Note: This dictation was prepared with Dragon dictation along with smaller phrase technology. Any transcriptional errors that result from this process are unintentional.  [name] M.D    [facility]   CC: Primary care physician; Whitney Cunning, MD

## 2023-12-07 NOTE — Plan of Care (Signed)

## 2023-12-07 NOTE — Progress Notes (Signed)
 Approximately 1400--Pt stated that she had a moderate-sized BM. NGT still having small output. NGT remains in place. MD Cornel Diesel notified. Per MD, this RN to remove NGT, change diet to clears, and advance diet as tolerated. Verbal orders placed.

## 2023-12-08 DIAGNOSIS — K56609 Unspecified intestinal obstruction, unspecified as to partial versus complete obstruction: Secondary | ICD-10-CM | POA: Diagnosis not present

## 2023-12-08 DIAGNOSIS — K565 Intestinal adhesions [bands], unspecified as to partial versus complete obstruction: Principal | ICD-10-CM

## 2023-12-08 LAB — GLUCOSE, CAPILLARY: Glucose-Capillary: 93 mg/dL (ref 70–99)

## 2023-12-08 NOTE — Plan of Care (Signed)
   Problem: Education: Goal: Knowledge of General Education information will improve Description Including pain rating scale, medication(s)/side effects and non-pharmacologic comfort measures Outcome: Progressing   Problem: Health Behavior/Discharge Planning: Goal: Ability to manage health-related needs will improve Outcome: Progressing

## 2023-12-08 NOTE — Plan of Care (Signed)
  Problem: Education: Goal: Knowledge of General Education information will improve Description: Including pain rating scale, medication(s)/side effects and non-pharmacologic comfort measures 12/08/2023 1133 by Apolinar Baxter, RN Outcome: Adequate for Discharge 12/08/2023 1133 by Apolinar Baxter, RN Outcome: Progressing   Problem: Health Behavior/Discharge Planning: Goal: Ability to manage health-related needs will improve 12/08/2023 1133 by Apolinar Baxter, RN Outcome: Adequate for Discharge 12/08/2023 1133 by Apolinar Baxter, RN Outcome: Progressing   Problem: Clinical Measurements: Goal: Ability to maintain clinical measurements within normal limits will improve Outcome: Adequate for Discharge Goal: Will remain free from infection Outcome: Adequate for Discharge Goal: Diagnostic test results will improve Outcome: Adequate for Discharge Goal: Respiratory complications will improve Outcome: Adequate for Discharge Goal: Cardiovascular complication will be avoided Outcome: Adequate for Discharge   Problem: Activity: Goal: Risk for activity intolerance will decrease Outcome: Adequate for Discharge   Problem: Nutrition: Goal: Adequate nutrition will be maintained Outcome: Adequate for Discharge   Problem: Coping: Goal: Level of anxiety will decrease Outcome: Adequate for Discharge   Problem: Elimination: Goal: Will not experience complications related to bowel motility Outcome: Adequate for Discharge Goal: Will not experience complications related to urinary retention Outcome: Adequate for Discharge   Problem: Pain Managment: Goal: General experience of comfort will improve and/or be controlled Outcome: Adequate for Discharge   Problem: Safety: Goal: Ability to remain free from injury will improve Outcome: Adequate for Discharge   Problem: Skin Integrity: Goal: Risk for impaired skin integrity will decrease Outcome: Adequate for Discharge

## 2023-12-08 NOTE — Progress Notes (Signed)
 CC: SBO Subjective: Patient reports feeling better today.  She says that she is still having some soreness in the right lower quadrant with some cramping pain.  However, she has had multiple bowel movements.  She has tolerated a diet.  Objective: Vital signs in last 24 hours: Temp:  [97.8 F (36.6 C)-98.5 F (36.9 C)] 98 F (36.7 C) (05/18 0523) Pulse Rate:  [78-98] 85 (05/18 0523) Resp:  [16-20] 20 (05/18 0523) BP: (116-134)/(60-68) 129/67 (05/18 0523) SpO2:  [97 %-99 %] 97 % (05/18 0523) Last BM Date : 12/07/23  Intake/Output from previous day: 05/17 0701 - 05/18 0700 In: 2328.8 [P.O.:30; I.V.:2298.8] Out: 200 [Emesis/NG output:200] Intake/Output this shift: No intake/output data recorded.  Physical exam:  Abdomen soft, nondistended, tenderness to palpation in the right lower quadrant and over the suprapubic area.  No signs of peritonitis.  Lab Results: CBC  Recent Labs    12/06/23 1526 12/07/23 0432  WBC 14.7* 11.4*  HGB 13.1 10.7*  HCT 41.9 34.3*  PLT 321 303   BMET Recent Labs    12/06/23 1526 12/07/23 0432  NA 139 140  K 4.9 4.2  CL 106 108  CO2 21* 25  GLUCOSE 111* 96  BUN 11 11  CREATININE 0.85 0.65  CALCIUM  9.5 8.6*   PT/INR Recent Labs    12/06/23 2248  LABPROT 13.9  INR 1.1   ABG No results for input(s): "PHART", "HCO3" in the last 72 hours.  Invalid input(s): "PCO2", "PO2"  Studies/Results: DG Abd Portable 1V-Small Bowel Obstruction Protocol-initial, 8 hr delay Result Date: 12/07/2023 CLINICAL DATA:  Small bowel follow-through 8 hour delay. EXAM: PORTABLE ABDOMEN - 1 VIEW COMPARISON:  Abdominal x-ray 12/06/2023. CT abdomen and pelvis 12/06/2023. FINDINGS: Oral contrast is seen throughout nondilated colon to the level of the rectum. No dilated bowel loops are seen. Lung bases are clear. No acute fractures are seen. IMPRESSION: No evidence for bowel obstruction. Oral contrast is seen to the level of the rectum. Electronically Signed   By:  Tyron Gallon M.D.   On: 12/07/2023 17:58   DG Abd 1 View Result Date: 12/06/2023 CLINICAL DATA:  NG tube EXAM: ABDOMEN - 1 VIEW COMPARISON:  Abdominal x-ray 12/28/2021 FINDINGS: Nasogastric tube tip is in the body of the stomach. IMPRESSION: Nasogastric tube tip is in the body of the stomach. Electronically Signed   By: Tyron Gallon M.D.   On: 12/06/2023 20:17   CT ABDOMEN PELVIS W CONTRAST Result Date: 12/06/2023 CLINICAL DATA:  Right lower quadrant pain. EXAM: CT ABDOMEN AND PELVIS WITH CONTRAST TECHNIQUE: Multidetector CT imaging of the abdomen and pelvis was performed using the standard protocol following bolus administration of intravenous contrast. RADIATION DOSE REDUCTION: This exam was performed according to the departmental dose-optimization program which includes automated exposure control, adjustment of the mA and/or kV according to patient size and/or use of iterative reconstruction technique. CONTRAST:  OMNIPAQUE  IOHEXOL  300 MG/ML  SOLN COMPARISON:  CT abdomen and pelvis 10/17/2022 FINDINGS: Lower chest: No acute abnormality. Hepatobiliary: No focal liver abnormality is seen. No gallstones, gallbladder wall thickening, or biliary dilatation. Pancreas: Unremarkable. No pancreatic ductal dilatation or surrounding inflammatory changes. Spleen: Normal in size without focal abnormality. Adrenals/Urinary Tract: Adrenal glands are unremarkable. Kidneys are normal, without renal calculi, focal lesion, or hydronephrosis. Bladder is unremarkable. Stomach/Bowel: There are dilated small bowel loops in the right abdomen with associated mesenteric edema. Transition point is seen and is small bowel anastomosis in the right lower quadrant image 2/61. There is  no pneumatosis or free air there is mild small bowel wall thickening of the affected loops. There is a moderate sized air-fluid level in the stomach. The colon is nondilated. The appendix is within normal limits. Sigmoid colon anastomosis is again  seen. Vascular/Lymphatic: Aortic atherosclerosis. No enlarged abdominal or pelvic lymph nodes. Reproductive: Status post hysterectomy. No adnexal masses. Other: There is a small amount of free fluid in the pelvis and right abdomen. There is no free intraperitoneal air or focal abdominal wall hernia. Musculoskeletal: No acute or significant osseous findings. IMPRESSION: 1. Small-bowel obstruction with transition point in the right lower quadrant at the small bowel anastomosis. Mesenteric edema is present which can be seen with vascular compromise. 2. Small amount of free fluid in the pelvis and right abdomen. 3. Aortic atherosclerosis. Aortic Atherosclerosis (ICD10-I70.0). Electronically Signed   By: Tyron Gallon M.D.   On: 12/06/2023 18:40   Small bowel follow-through independently reviewed by me and there is contrast that goes through to the colon. Anti-infectives: Anti-infectives (From admission, onward)    None       Assessment/Plan:  Patient with small bowel obstruction that is now resolved.  She is having normal bowel function and tolerating a soft diet.  She still has a little bit of cramping pain.  I discussed with her that this is normal.  At this time she is appropriate for discharge from a surgical standpoint.  She does not need follow-up with surgery although she does have follow-up with her GI in several weeks.  Surgery to sign off at this time please call with any questions   A total of 35 minutes was spent reviewing the patient's chart, performing an interval history and physical and discussing treatment options with the patient.  Severa Daniels, M.D.  [facility]

## 2023-12-08 NOTE — Discharge Instructions (Signed)
 Pt to keep her GI appt with Dr Emerick Hanlon

## 2023-12-08 NOTE — Progress Notes (Signed)
 Removed IV-CDI. Reviewed d/c paperwork with patient and answered questions. Wheeled stable patient and belongings to main entrance where she was picked up by her husband to d/c to home.

## 2023-12-08 NOTE — Plan of Care (Signed)

## 2023-12-08 NOTE — Discharge Summary (Signed)
 Physician Discharge Summary   Patient: Whitney Cooper MRN: [id number] DOB: January 14, 1961  Admit date:     12/06/2023  Discharge date: 12/08/23  Discharge Physician: [name]   PCP: Sari Cunning, MD   Recommendations at discharge:    F/u GI Dr Emerick Hanlon on your appt  Discharge Diagnoses: Principal Problem:   Small bowel obstruction due to adhesions [facility]) Active Problems:   Leukocytosis   Hypertension   HLD (hyperlipidemia)   Depression with anxiety  Whitney Cooper is a 63 y.o. female with medical history significant of s/p of colectomy due to recurrent diverticulitis, small bowel obstruction, hypertension, hyperlipidemia, depression with anxiety, overactive bladder, UTI, fibromyalgia, kidney stone, who presents with abdominal pain.    CT of abdomen/pelvis: 1. Small-bowel obstruction with transition point in the right lower quadrant at the small bowel anastomosis. Mesenteric edema is present which can be seen with vascular compromise. 2. Small amount of free fluid in the pelvis and right abdomen. 3. Aortic atherosclerosis.   SBO (small bowel obstruction) (HCC): This is recurrent issue.  Patient has history of colectomy, possibly due to adhesion.  Consulted with Dr. Cornel Diesel of surgery.  -NG tube--removed - As needed morphine , Percocet, Tylenol  for pain  -Prn Zofran  prn nausea   -WUJ:WJXB NS --pt had BM and tolerating po soft diet. Ok to d/c from surgery standpoint--Dr Cornel Diesel   Leukocytosis: WBC 14.7, no fever, likely reactive. -Trending down   Hypertension -IV hydralazine  as needed - Metoprolol    HLD (hyperlipidemia) -Crestor    Depression with anxiety - Amitriptyline   Will d/c home with outpt appt GI and Gen surgery if needed     Procedures: Family communication : none Consults : general surgery CODE STATUS: full DVT Prophylaxis : ambulation     DISCHARGE MEDICATION: Allergies as of 12/08/2023       Reactions   Baclofen Other (See Comments)   Dizziness    Codeine Other (See Comments)   jittery   Hydrocodone  Other (See Comments)   Headache,dizziness   Hydroxyzine     Made her very nervous   Sulfa Antibiotics Rash        Medication List     STOP taking these medications    amoxicillin -clavulanate 875-125 MG tablet Commonly known as: AUGMENTIN    Calcium  Carbonate-Vitamin D  600-200 MG-UNIT Tabs   fluconazole  150 MG tablet Commonly known as: DIFLUCAN    vitamin C 250 MG tablet Commonly known as: ASCORBIC ACID       TAKE these medications    acetaminophen  500 MG tablet Commonly known as: TYLENOL  Take 1,000 mg by mouth 2 (two) times a day.   amitriptyline  10 MG tablet Commonly known as: ELAVIL  Take 10 mg by mouth at bedtime.   antiseptic oral rinse Liqd 15 mLs by Mouth Rinse route as needed for dry mouth.   CRANBERRY PO Take 2 tablets by mouth 2 (two) times daily.   fluticasone  50 MCG/ACT nasal spray Commonly known as: FLONASE  Place 2 sprays into both nostrils daily.   levocetirizine 5 MG tablet Commonly known as: Xyzal  Take 1 tablet (5 mg total) by mouth every evening.   metoprolol  succinate 25 MG 24 hr tablet Commonly known as: TOPROL -XL Take 1 tablet (25 mg total) by mouth daily.   multivitamin with minerals Tabs tablet Take 1 tablet by mouth daily with lunch. Centrum Silver Chewable   omeprazole 40 MG capsule Commonly known as: PRILOSEC Take 40 mg by mouth in the morning.   rosuvastatin  5 MG tablet Commonly known as: CRESTOR   Take 5 mg by mouth daily.   Saline 0.9 % Aers Place 1 spray into both nostrils daily as needed (dry nasal passages).   Systane Balance 0.6 % Soln Generic drug: Propylene Glycol Place 1 drop into both eyes 2 (two) times daily as needed (dry/irritated eyes.).        Follow-up Information     Sari Cunning, MD. Schedule an appointment as soon as possible for a visit in 1 week(s).   Specialty: Internal Medicine Contact information: [phone] [facility] [address] [facility]  [place] [address] [phone]         Shane Darling, MD. Go to.   Specialty: Gastroenterology Why: on your schedule Contact information: [address] [place] [address] [phone]         Barrett Lick, MD. Go to.   Specialty: General Surgery Why: As needed, If symptoms worsen Contact information: [address] [place] [address] [phone]                 Condition at discharge: fair  The results of significant diagnostics from this hospitalization (including imaging, microbiology, ancillary and laboratory) are listed below for reference.   Imaging Studies: DG Abd Portable 1V-Small Bowel Obstruction Protocol-initial, 8 hr delay Result Date: 12/07/2023 CLINICAL DATA:  Small bowel follow-through 8 hour delay. EXAM: PORTABLE ABDOMEN - 1 VIEW COMPARISON:  Abdominal x-ray 12/06/2023. CT abdomen and pelvis 12/06/2023. FINDINGS: Oral contrast is seen throughout nondilated colon to the level of the rectum. No dilated bowel loops are seen. Lung bases are clear. No acute fractures are seen. IMPRESSION: No evidence for bowel obstruction. Oral contrast is seen to the level of the rectum. Electronically Signed   By: Tyron Gallon M.D.   On: 12/07/2023 17:58   DG Abd 1 View Result Date: 12/06/2023 CLINICAL DATA:  NG tube EXAM: ABDOMEN - 1 VIEW COMPARISON:  Abdominal x-ray 12/28/2021 FINDINGS: Nasogastric tube tip is in the body of the stomach. IMPRESSION: Nasogastric tube tip is in the body of the stomach. Electronically Signed   By: Tyron Gallon M.D.   On: 12/06/2023 20:17   CT ABDOMEN PELVIS W CONTRAST Result Date: 12/06/2023 CLINICAL DATA:  Right lower quadrant pain. EXAM: CT ABDOMEN AND PELVIS WITH CONTRAST TECHNIQUE: Multidetector CT imaging of the abdomen and pelvis was performed using the standard protocol following bolus administration of intravenous contrast. RADIATION DOSE REDUCTION: This exam was performed  according to the departmental dose-optimization program which includes automated exposure control, adjustment of the mA and/or kV according to patient size and/or use of iterative reconstruction technique. CONTRAST:  OMNIPAQUE  IOHEXOL  300 MG/ML  SOLN COMPARISON:  CT abdomen and pelvis 10/17/2022 FINDINGS: Lower chest: No acute abnormality. Hepatobiliary: No focal liver abnormality is seen. No gallstones, gallbladder wall thickening, or biliary dilatation. Pancreas: Unremarkable. No pancreatic ductal dilatation or surrounding inflammatory changes. Spleen: Normal in size without focal abnormality. Adrenals/Urinary Tract: Adrenal glands are unremarkable. Kidneys are normal, without renal calculi, focal lesion, or hydronephrosis. Bladder is unremarkable. Stomach/Bowel: There are dilated small bowel loops in the right abdomen with associated mesenteric edema. Transition point is seen and is small bowel anastomosis in the right lower quadrant image 2/61. There is no pneumatosis or free air there is mild small bowel wall thickening of the affected loops. There is a moderate sized air-fluid level in the stomach. The colon is nondilated. The appendix is within normal limits. Sigmoid colon anastomosis is again seen. Vascular/Lymphatic: Aortic atherosclerosis. No enlarged  abdominal or pelvic lymph nodes. Reproductive: Status post hysterectomy. No adnexal masses. Other: There is a small amount of free fluid in the pelvis and right abdomen. There is no free intraperitoneal air or focal abdominal wall hernia. Musculoskeletal: No acute or significant osseous findings. IMPRESSION: 1. Small-bowel obstruction with transition point in the right lower quadrant at the small bowel anastomosis. Mesenteric edema is present which can be seen with vascular compromise. 2. Small amount of free fluid in the pelvis and right abdomen. 3. Aortic atherosclerosis. Aortic Atherosclerosis (ICD10-I70.0). Electronically Signed   By: Tyron Gallon  M.D.   On: 12/06/2023 18:40    Microbiology: Results for orders placed or performed in visit on 09/04/23  Microscopic Examination     Status: None   Collection Time: 09/04/23  8:32 AM   Urine  Result Value Ref Range Status   WBC, UA 0-5 0 - 5 /hpf Final   RBC, Urine 0-2 0 - 2 /hpf Final   Epithelial Cells (non renal) 0-10 0 - 10 /hpf Final   Bacteria, UA Few None seen/Few Final    Labs: CBC: Recent Labs  Lab 12/06/23 1526 12/07/23 0432  WBC 14.7* 11.4*  NEUTROABS 11.5*  --   HGB 13.1 10.7*  HCT 41.9 34.3*  MCV 87.1 82.7  PLT 321 303   Basic Metabolic Panel: Recent Labs  Lab 12/06/23 1526 12/07/23 0432  NA 139 140  K 4.9 4.2  CL 106 108  CO2 21* 25  GLUCOSE 111* 96  BUN 11 11  CREATININE 0.85 0.65  CALCIUM  9.5 8.6*   Liver Function Tests: Recent Labs  Lab 12/06/23 1526  AST 23  ALT 17  ALKPHOS 52  BILITOT 0.6  PROT 7.6  ALBUMIN  4.4   CBG: Recent Labs  Lab 12/07/23 0748 12/08/23 0824  GLUCAP 95 93    Discharge time spent: greater than 30 minutes.  Signed: [name], MD [facility] 12/08/2023

## 2024-03-15 ENCOUNTER — Other Ambulatory Visit: Payer: Self-pay

## 2024-03-15 ENCOUNTER — Emergency Department
Admission: EM | Admit: 2024-03-15 | Discharge: 2024-03-15 | Disposition: A | Discharge status: Home / Self Care | Attending: Emergency Medicine | Admitting: Emergency Medicine

## 2024-03-15 ENCOUNTER — Emergency Department

## 2024-03-15 DIAGNOSIS — D72829 Elevated white blood cell count, unspecified: Secondary | ICD-10-CM | POA: Diagnosis not present

## 2024-03-15 DIAGNOSIS — R1084 Generalized abdominal pain: Secondary | ICD-10-CM | POA: Insufficient documentation

## 2024-03-15 DIAGNOSIS — R112 Nausea with vomiting, unspecified: Secondary | ICD-10-CM | POA: Insufficient documentation

## 2024-03-15 LAB — CBC
HCT: 40.6 % (ref 36.0–46.0)
Hemoglobin: 12.8 g/dL (ref 12.0–15.0)
MCH: 26.7 pg (ref 26.0–34.0)
MCHC: 31.5 g/dL (ref 30.0–36.0)
MCV: 84.6 fL (ref 80.0–100.0)
Platelets: 279 K/uL (ref 150–400)
RBC: 4.8 MIL/uL (ref 3.87–5.11)
RDW: 14.7 % (ref 11.5–15.5)
WBC: 16.9 K/uL — ABNORMAL HIGH (ref 4.0–10.5)
nRBC: 0 % (ref 0.0–0.2)

## 2024-03-15 LAB — COMPREHENSIVE METABOLIC PANEL WITH GFR
ALT: 17 U/L (ref 0–44)
AST: 24 U/L (ref 15–41)
Albumin: 4 g/dL (ref 3.5–5.0)
Alkaline Phosphatase: 46 U/L (ref 38–126)
Anion gap: 11 (ref 5–15)
BUN: 20 mg/dL (ref 8–23)
CO2: 26 mmol/L (ref 22–32)
Calcium: 9.8 mg/dL (ref 8.9–10.3)
Chloride: 102 mmol/L (ref 98–111)
Creatinine, Ser: 0.72 mg/dL (ref 0.44–1.00)
GFR, Estimated: >60 mL/min (ref >60)
Glucose, Bld: 162 mg/dL — ABNORMAL HIGH (ref 70–99)
Potassium: 4.6 mmol/L (ref 3.5–5.1)
Sodium: 139 mmol/L (ref 135–145)
Total Bilirubin: 0.5 mg/dL (ref 0.0–1.2)
Total Protein: 7.3 g/dL (ref 6.5–8.1)

## 2024-03-15 LAB — URINALYSIS, ROUTINE W REFLEX MICROSCOPIC
Bilirubin Urine: NEGATIVE
Glucose, UA: NEGATIVE mg/dL
Hgb urine dipstick: NEGATIVE
Ketones, ur: NEGATIVE mg/dL
Leukocytes,Ua: NEGATIVE
Nitrite: NEGATIVE
Protein, ur: NEGATIVE mg/dL
Specific Gravity, Urine: 1.028 (ref 1.005–1.030)
pH: 7 (ref 5.0–8.0)

## 2024-03-15 LAB — LIPASE, BLOOD: Lipase: 36 U/L (ref 11–51)

## 2024-03-15 MED ORDER — MORPHINE SULFATE (PF) 4 MG/ML IV SOLN
4.0000 mg | Freq: Once | INTRAVENOUS | Status: AC
  Administered 2024-03-15: 4 mg via INTRAVENOUS
  Filled 2024-03-15: qty 1

## 2024-03-15 MED ORDER — IOHEXOL 300 MG/ML  SOLN
100.0000 mL | Freq: Once | INTRAMUSCULAR | Status: AC | PRN
  Administered 2024-03-15: 100 mL via INTRAVENOUS

## 2024-03-15 MED ORDER — SODIUM CHLORIDE 0.9 % IV BOLUS
1000.0000 mL | Freq: Once | INTRAVENOUS | Status: AC
  Administered 2024-03-15: 1000 mL via INTRAVENOUS

## 2024-03-15 MED ORDER — ONDANSETRON HCL 4 MG/2ML IJ SOLN
4.0000 mg | Freq: Once | INTRAMUSCULAR | Status: AC
  Administered 2024-03-15: 4 mg via INTRAVENOUS
  Filled 2024-03-15: qty 2

## 2024-03-15 NOTE — ED Provider Notes (Signed)
 CT without SBO.  Nonspecific mesenteric edema within the descending segment of the colon.  I reassessed the patient and she reports feeling much better and pain is essentially resolved.   no significant tenderness on exam.   Due to CT findings and surgical history I do talk the case over with Dr. Jordis.  He reviewed CT images and reports possible viral syndrome, enteritis but not particularly concerned.  I think this is reasonable considering her well-appearing status and resolution of pain.  Discussed return precautions with the patient and she is suitable for outpatient management.   Whitney Rover, MD 03/15/24 [phone]

## 2024-03-15 NOTE — ED Triage Notes (Signed)
 Pt to ED by POV from home with c/o generalized abdominal pain. Pt is concerned she has an SBO as this feels similar to the SBO she had in may of this year. Pt endorses 2 episodes of emesis noted to be undigested food, abdomen is distended. Arrives A+O, VSS.

## 2024-03-15 NOTE — ED Provider Notes (Signed)
 [facility] Provider Note    Event Date/Time   First MD Initiated Contact with Patient 03/15/24 [phone]     (approximate)   History   Abdominal Pain   HPI  Whitney Cooper is a 63 y.o. female   Past medical history of multiple abdominal surgeries with a history of bowel obstructions who presents the emergency department with sudden onset left-sided abdominal pain, distention, multiple episodes of vomiting tonight while watching TV.  Otherwise was in her regular state of health leading up to the pain episode.  Had a small amount of liquid stool while vomiting earlier.  Was passing flatus leading up to the acute onset of pain.  Denies urinary symptoms.  Denies GI bleeding.  No chest pain or shortness of breath.  Independent Historian contributed to assessment above: Husband corroborates information above  External Medical Documents Reviewed: Recent hospitalization notes for SBO      Physical Exam   Triage Vital Signs: ED Triage Vitals  Encounter Vitals Group     BP 03/15/24 0348 98/78     Girls Systolic BP Percentile --      Girls Diastolic BP Percentile --      Boys Systolic BP Percentile --      Boys Diastolic BP Percentile --      Pulse Rate 03/15/24 0348 74     Resp 03/15/24 0348 18     Temp 03/15/24 0348 98.1 F (36.7 C)     Temp Source 03/15/24 0348 Oral     SpO2 03/15/24 0348 100 %     Weight 03/15/24 0349 126 lb (57.2 kg)     Height 03/15/24 0349 5' 3 (1.6 m)     Head Circumference --      Peak Flow --      Pain Score 03/15/24 0348 10     Pain Loc --      Pain Education --      Exclude from Growth Chart --     Most recent vital signs: Vitals:   03/15/24 0348  BP: 98/78  Pulse: 74  Resp: 18  Temp: 98.1 F (36.7 C)  SpO2: 100%    General: Awake, no distress.  CV:  Good peripheral perfusion.  Resp:  Normal effort.  Abd:  No distention.  Other:  Mild distention, left-sided greater than right-sided tenderness to palpation.   Vital signs significant for soft blood pressure, otherwise normal.  Afebrile.   ED Results / Procedures / Treatments   Labs (all labs ordered are listed, but only abnormal results are displayed) Labs Reviewed  COMPREHENSIVE METABOLIC PANEL WITH GFR - Abnormal; Notable for the following components:      Result Value   Glucose, Bld 162 (*)    All other components within normal limits  CBC - Abnormal; Notable for the following components:   WBC 16.9 (*)    All other components within normal limits  LIPASE, BLOOD  URINALYSIS, ROUTINE W REFLEX MICROSCOPIC     I ordered and reviewed the above labs they are notable for white blood cell count elevated 16.9.     PROCEDURES:  Critical Care performed: No  Procedures   MEDICATIONS ORDERED IN ED: Medications  iohexol  (OMNIPAQUE ) 300 MG/ML solution 100 mL (has no administration in time range)  ondansetron  (ZOFRAN ) injection 4 mg (4 mg Intravenous Given 03/15/24 0626)  sodium chloride  0.9 % bolus 1,000 mL (1,000 mLs Intravenous New Bag/Given 03/15/24 0626)  morphine  (PF) 4 MG/ML injection 4 mg (  4 mg Intravenous Given 03/15/24 0627)     IMPRESSION / MDM / ASSESSMENT AND PLAN / ED COURSE  I reviewed the triage vital signs and the nursing notes.                                Patient's presentation is most consistent with acute presentation with potential threat to life or bodily function. Differential diagnosis includes, but is not limited to, small bowel obstruction, perforation, diverticulitis, renal colic   The patient is on the cardiac monitor to evaluate for evidence of arrhythmia and/or significant heart rate changes.  MDM:    History of abdominal surgeries with obstructions in the past with sudden onset distention vomiting and pain with tenderness to palpation concerning for obstruction or other acute abdominal pathologies perforation infections etc.  Will give IV morphine , fluids, antiemetic and get a CT scan of the  abdomen pelvis, keep NPO.       FINAL CLINICAL IMPRESSION(S) / ED DIAGNOSES   Final diagnoses:  Abdominal pain, generalized  Nausea and vomiting, unspecified vomiting type     Rx / DC Orders   ED Discharge Orders     None        Note:  This document was prepared using Dragon voice recognition software and may include unintentional dictation errors.    Cyrena Mylar, MD 03/15/24 [phone]

## 2024-03-25 ENCOUNTER — Other Ambulatory Visit: Payer: Self-pay | Admitting: Internal Medicine

## 2024-03-25 DIAGNOSIS — K828 Other specified diseases of gallbladder: Secondary | ICD-10-CM

## 2024-04-01 ENCOUNTER — Encounter: Payer: Self-pay | Admitting: *Deleted

## 2024-04-15 ENCOUNTER — Other Ambulatory Visit: Payer: Self-pay | Admitting: Internal Medicine

## 2024-04-15 DIAGNOSIS — Z1231 Encounter for screening mammogram for malignant neoplasm of breast: Secondary | ICD-10-CM

## 2024-04-24 ENCOUNTER — Ambulatory Visit: Admitting: Anesthesiology

## 2024-04-24 ENCOUNTER — Encounter
Admission: RE | Disposition: A | Discharge status: Home / Self Care | Payer: Self-pay | Source: Home / Self Care | Attending: Gastroenterology

## 2024-04-24 ENCOUNTER — Other Ambulatory Visit: Payer: Self-pay

## 2024-04-24 ENCOUNTER — Ambulatory Visit
Admission: RE | Admit: 2024-04-24 | Discharge: 2024-04-24 | Disposition: A | Discharge status: Home / Self Care | Attending: Gastroenterology | Admitting: Gastroenterology

## 2024-04-24 DIAGNOSIS — D122 Benign neoplasm of ascending colon: Secondary | ICD-10-CM | POA: Insufficient documentation

## 2024-04-24 DIAGNOSIS — M797 Fibromyalgia: Secondary | ICD-10-CM | POA: Diagnosis not present

## 2024-04-24 DIAGNOSIS — Z1211 Encounter for screening for malignant neoplasm of colon: Secondary | ICD-10-CM | POA: Diagnosis present

## 2024-04-24 DIAGNOSIS — I10 Essential (primary) hypertension: Secondary | ICD-10-CM | POA: Diagnosis not present

## 2024-04-24 DIAGNOSIS — Z980 Intestinal bypass and anastomosis status: Secondary | ICD-10-CM | POA: Diagnosis not present

## 2024-04-24 DIAGNOSIS — K5730 Diverticulosis of large intestine without perforation or abscess without bleeding: Secondary | ICD-10-CM | POA: Insufficient documentation

## 2024-04-24 DIAGNOSIS — K219 Gastro-esophageal reflux disease without esophagitis: Secondary | ICD-10-CM | POA: Insufficient documentation

## 2024-04-24 HISTORY — DX: Ventricular premature depolarization: I49.3

## 2024-04-24 HISTORY — PX: POLYPECTOMY: SHX149

## 2024-04-24 HISTORY — PX: COLONOSCOPY: SHX5424

## 2024-04-24 SURGERY — COLONOSCOPY
Anesthesia: General

## 2024-04-24 MED ORDER — LIDOCAINE HCL (PF) 2 % IJ SOLN
INTRAMUSCULAR | Status: AC
  Filled 2024-04-24: qty 10

## 2024-04-24 MED ORDER — PROPOFOL 500 MG/50ML IV EMUL
INTRAVENOUS | Status: DC | PRN
  Administered 2024-04-24: 75 ug/kg/min via INTRAVENOUS

## 2024-04-24 MED ORDER — LIDOCAINE HCL (CARDIAC) PF 100 MG/5ML IV SOSY
PREFILLED_SYRINGE | INTRAVENOUS | Status: DC | PRN
  Administered 2024-04-24: 50 mg via INTRAVENOUS

## 2024-04-24 MED ORDER — SODIUM CHLORIDE 0.9 % IV SOLN: INTRAVENOUS | Status: DC

## 2024-04-24 MED ORDER — DEXMEDETOMIDINE HCL IN NACL 80 MCG/20ML IV SOLN
INTRAVENOUS | Status: DC | PRN
  Administered 2024-04-24 (×2): 8 ug via INTRAVENOUS
  Administered 2024-04-24: 4 ug via INTRAVENOUS

## 2024-04-24 MED ORDER — ONDANSETRON HCL 4 MG/2ML IJ SOLN
INTRAMUSCULAR | Status: AC
  Filled 2024-04-24: qty 2

## 2024-04-24 MED ORDER — PROPOFOL 10 MG/ML IV BOLUS
INTRAVENOUS | Status: DC | PRN
  Administered 2024-04-24: 50 mg via INTRAVENOUS

## 2024-04-24 MED ORDER — ONDANSETRON HCL 4 MG/2ML IJ SOLN
INTRAMUSCULAR | Status: DC | PRN
  Administered 2024-04-24: 4 mg via INTRAVENOUS

## 2024-04-24 NOTE — Op Note (Signed)
 [facility] Gastroenterology Patient Name: Whitney Cooper Procedure Date: 04/24/2024 12:48 PM MRN: [id number] Account #: [id number] Date of Birth: June 24, 1961 Admit Type: Outpatient Age: 63 Room: [facility] ENDO ROOM 3 Gender: Female Note Status: Finalized Instrument Name: Peds Colonoscope [id number] Procedure:             Colonoscopy Indications:           High risk colon cancer surveillance: Personal history                         of colonic polyps, Last colonoscopy 5 years ago Providers:             Ole Schick MD, MD Referring MD:          Oneil PHEBE Pinal, MD (Referring MD) Medicines:             Monitored Anesthesia Care Complications:         No immediate complications. Estimated blood loss:                         Minimal. Procedure:             Pre-Anesthesia Assessment:                        - Prior to the procedure, a History and Physical was                         performed, and patient medications and allergies were                         reviewed. The patient is competent. The risks and                         benefits of the procedure and the sedation options and                         risks were discussed with the patient. All questions                         were answered and informed consent was obtained.                         Patient identification and proposed procedure were                         verified by the physician, the nurse, the                         anesthesiologist, the anesthetist and the technician                         in the endoscopy suite. Mental Status Examination:                         alert and oriented. Airway Examination: normal                         oropharyngeal airway and neck mobility. Respiratory  Examination: clear to auscultation. CV Examination:                         normal. Prophylactic Antibiotics: The patient does not                         require prophylactic antibiotics.  Prior                         Anticoagulants: The patient has taken no anticoagulant                         or antiplatelet agents. ASA Grade Assessment: II - A                         patient with mild systemic disease. After reviewing                         the risks and benefits, the patient was deemed in                         satisfactory condition to undergo the procedure. The                         anesthesia plan was to use monitored anesthesia care                         (MAC). Immediately prior to administration of                         medications, the patient was re-assessed for adequacy                         to receive sedatives. The heart rate, respiratory                         rate, oxygen saturations, blood pressure, adequacy of                         pulmonary ventilation, and response to care were                         monitored throughout the procedure. The physical                         status of the patient was re-assessed after the                         procedure.                        After obtaining informed consent, the colonoscope was                         passed under direct vision. Throughout the procedure,                         the patient's blood pressure, pulse, and oxygen  saturations were monitored continuously. The                         Colonoscope was introduced through the anus and                         advanced to the the terminal ileum, with                         identification of the appendiceal orifice and IC                         valve. The colonoscopy was performed without                         difficulty. The patient tolerated the procedure well.                         The quality of the bowel preparation was good. The                         terminal ileum, ileocecal valve, appendiceal orifice,                         and rectum were photographed. Findings:      The perianal and digital  rectal examinations were normal.      The terminal ileum appeared normal.      A 3 mm polyp was found in the ascending colon. The polyp was sessile.       The polyp was removed with a cold snare. Resection and retrieval were       complete. Estimated blood loss was minimal.      A single medium-mouthed diverticulum was found in the transverse colon.      There was evidence of a prior end-to-end colo-colonic anastomosis in the       sigmoid colon. This was patent and was characterized by healthy       appearing mucosa. The blind limb of the anastomosis was extremely long.       Likely due to redo surgery from anastomotic leak.      The exam was otherwise without abnormality on direct and retroflexion       views. Impression:            - The examined portion of the ileum was normal.                        - One 3 mm polyp in the ascending colon, removed with                         a cold snare. Resected and retrieved.                        - Diverticulosis in the transverse colon.                        - Patent end-to-end colo-colonic anastomosis,                         characterized by healthy appearing mucosa.                        -  The examination was otherwise normal on direct and                         retroflexion views. Recommendation:        - Discharge patient to home.                        - Resume previous diet.                        - Continue present medications.                        - Await pathology results.                        - Repeat colonoscopy in 7 years for surveillance.                        - Return to referring physician as previously                         scheduled. Procedure Code(s):     --- Professional ---                        [phone], Colonoscopy, flexible; with removal of                         tumor(s), polyp(s), or other lesion(s) by snare                         technique Diagnosis Code(s):     --- Professional ---                         Z86.010, Personal history of colonic polyps                        D12.2, Benign neoplasm of ascending colon                        Z98.0, Intestinal bypass and anastomosis status                        K57.30, Diverticulosis of large intestine without                         perforation or abscess without bleeding CPT copyright 2022 [employer]. All rights reserved. The codes documented in this report are preliminary and upon coder review may  be revised to meet current compliance requirements. Ole Schick MD, MD 04/24/2024 1:15:39 PM Number of Addenda: 0 Note Initiated On: 04/24/2024 12:48 PM Scope Withdrawal Time: 0 hours 6 minutes 45 seconds  Total Procedure Duration: 0 hours 12 minutes 27 seconds  Estimated Blood Loss:  Estimated blood loss was minimal.      [facility]

## 2024-04-24 NOTE — Transfer of Care (Signed)
 Immediate Anesthesia Transfer of Care Note  Patient: Whitney Cooper  Procedure(s) Performed: COLONOSCOPY POLYPECTOMY, INTESTINE  Patient Location: PACU  Anesthesia Type:General  Level of Consciousness: sedated  Airway & Oxygen Therapy: Patient Spontanous Breathing  Post-op Assessment: Report given to RN and Post -op Vital signs reviewed and stable  Post vital signs: Reviewed and stable  Last Vitals:  Vitals Value Taken Time  BP 86/45 04/24/24 13:11  Temp 36.8 C 04/24/24 13:10  Pulse 63 04/24/24 13:11  Resp 13 04/24/24 13:11  SpO2 100 % 04/24/24 13:11  Vitals shown include unfiled device data.  Last Pain:  Vitals:   04/24/24 1310  TempSrc: Temporal  PainSc: Asleep         Complications: No notable events documented.

## 2024-04-24 NOTE — Anesthesia Postprocedure Evaluation (Signed)
 Anesthesia Post Note  Patient: Whitney Cooper  Procedure(s) Performed: COLONOSCOPY POLYPECTOMY, INTESTINE  Patient location during evaluation: PACU Anesthesia Type: General Level of consciousness: awake and awake and alert Pain management: satisfactory to patient Vital Signs Assessment: post-procedure vital signs reviewed and stable Respiratory status: spontaneous breathing Cardiovascular status: stable Anesthetic complications: no   No notable events documented.   Last Vitals:  Vitals:   04/24/24 1207 04/24/24 1310  BP: (!) 154/71 (!) 86/51  Pulse: 74   Resp: 18   Temp: (!) 36.2 C 36.8 C    Last Pain:  Vitals:   04/24/24 1320  TempSrc:   PainSc: 0-No pain                 VAN STAVEREN,Govind Furey

## 2024-04-24 NOTE — H&P (Signed)
 Outpatient short stay form Pre-procedure 04/24/2024  Whitney ONEIDA Schick, MD  Primary Physician: Cleotilde Oneil FALCON, MD  Reason for visit:  Surveillance  History of present illness:    63 y/o lady with history of hypertension and partial colectomy complicated by anastomotic leak requiring ileostomy and subsequent reversal. Gets SBO occasionally. No blood thinners. No family history of GI malignancies.    Current Facility-Administered Medications:    0.9 %  sodium chloride  infusion, , Intravenous, Continuous, Kenn Rekowski, Whitney ONEIDA, MD, Last Rate: 20 mL/hr at 04/24/24 1231, New Bag at 04/24/24 1231  Medications Prior to Admission  Medication Sig Dispense Refill Last Dose/Taking   metoprolol  succinate (TOPROL -XL) 25 MG 24 hr tablet Take 1 tablet (25 mg total) by mouth daily. 30 tablet 5 04/24/2024 at  6:30 AM   acetaminophen  (TYLENOL ) 500 MG tablet Take 1,000 mg by mouth 2 (two) times a day.      amitriptyline  (ELAVIL ) 10 MG tablet Take 10 mg by mouth at bedtime.      antiseptic oral rinse (BIOTENE) LIQD 15 mLs by Mouth Rinse route as needed for dry mouth.      CRANBERRY PO Take 2 tablets by mouth 2 (two) times daily.      fluticasone  (FLONASE ) 50 MCG/ACT nasal spray Place 2 sprays into both nostrils daily. 16 g 2    levocetirizine (XYZAL ) 5 MG tablet Take 1 tablet (5 mg total) by mouth every evening. 30 tablet 2    Multiple Vitamin (MULTIVITAMIN WITH MINERALS) TABS tablet Take 1 tablet by mouth daily with lunch. Centrum Silver Chewable      omeprazole (PRILOSEC) 40 MG capsule Take 40 mg by mouth in the morning.      rosuvastatin  (CRESTOR ) 5 MG tablet Take 5 mg by mouth daily.      Saline 0.9 % AERS Place 1 spray into both nostrils daily as needed (dry nasal passages).      SYSTANE BALANCE 0.6 % SOLN Place 1 drop into both eyes 2 (two) times daily as needed (dry/irritated eyes.).        Allergies  Allergen Reactions   Baclofen Other (See Comments)    Dizziness   Codeine Other (See Comments)     jittery   Hydrocodone  Other (See Comments)    Headache,dizziness   Hydroxyzine      Made her very nervous   Sulfa Antibiotics Rash     Past Medical History:  Diagnosis Date   Beat, premature ventricular 06/21/2015   Bundle branch block    palpitations a few weeks ago   DDD (degenerative disc disease), cervical    Diverticulitis of colon 07/07/2009   Dry eyes    Essential (primary) hypertension 06/21/2015   Fibromyalgia    GERD (gastroesophageal reflux disease)    Headache    migraine   History of chicken pox    History of kidney stones    Hyperlipidemia    Hypertension    IFG (impaired fasting glucose) 01/24/2015   Lumbar herniated disc    OAB (overactive bladder)    Osteoarthritis    Osteopenia determined by x-ray 02/10/2016   PVC (premature ventricular contraction)    Recurrent UTI    Urinary incontinence    Vertigo     Review of systems:  Otherwise negative.    Physical Exam  Gen: Alert, oriented. Appears stated age.  HEENT: PERRLA. Lungs: No respiratory distress CV: RRR Abd: soft, benign, no masses Ext: No edema    Planned procedures: Proceed with colonoscopy. The patient  understands the nature of the planned procedure, indications, risks, alternatives and potential complications including but not limited to bleeding, infection, perforation, damage to internal organs and possible oversedation/side effects from anesthesia. The patient agrees and gives consent to proceed.  Please refer to procedure notes for findings, recommendations and patient disposition/instructions.     Whitney ONEIDA Schick, MD [facility] Gastroenterology

## 2024-04-24 NOTE — Interval H&P Note (Signed)
 History and Physical Interval Note:  04/24/2024 12:48 PM  Whitney Cooper  has presented today for surgery, with the diagnosis of Z86.0100 (ICD-10-CM) - Personal history of colon polyps, unspecified.  The various methods of treatment have been discussed with the patient and family. After consideration of risks, benefits and other options for treatment, the patient has consented to  Procedure(s): COLONOSCOPY (N/A) as a surgical intervention.  The patient's history has been reviewed, patient examined, no change in status, stable for surgery.  I have reviewed the patient's chart and labs.  Questions were answered to the patient's satisfaction.     Whitney Cooper  Ok to proceed with colonoscopy

## 2024-04-24 NOTE — Anesthesia Preprocedure Evaluation (Signed)
 Anesthesia Evaluation  Patient identified by MRN, date of birth, ID band Patient awake    Reviewed: Allergy & Precautions, NPO status , Patient's Chart, lab work & pertinent test results  Airway Mallampati: II  TM Distance: >3 FB Neck ROM: full    Dental  (+) Teeth Intact   Pulmonary neg pulmonary ROS   Pulmonary exam normal breath sounds clear to auscultation       Cardiovascular Exercise Tolerance: Good hypertension, Pt. on medications negative cardio ROS Normal cardiovascular exam Rhythm:Regular Rate:Normal     Neuro/Psych  Headaches  Anxiety     Hx of vertigo when turning on her left side negative neurological ROS  negative psych ROS   GI/Hepatic negative GI ROS, Neg liver ROS,GERD  Medicated,,  Endo/Other  negative endocrine ROS    Renal/GU negative Renal ROS  negative genitourinary   Musculoskeletal  (+) Arthritis ,  Fibromyalgia -  Abdominal   Peds negative pediatric ROS (+)  Hematology negative hematology ROS (+)   Anesthesia Other Findings Past Medical History: 06/21/2015: Beat, premature ventricular No date: Bundle branch block     Comment:  palpitations a few weeks ago No date: DDD (degenerative disc disease), cervical 07/07/2009: Diverticulitis of colon No date: Dry eyes 06/21/2015: Essential (primary) hypertension No date: Fibromyalgia No date: GERD (gastroesophageal reflux disease) No date: Headache     Comment:  migraine No date: History of chicken pox No date: History of kidney stones No date: Hyperlipidemia No date: Hypertension 01/24/2015: IFG (impaired fasting glucose) No date: Lumbar herniated disc No date: OAB (overactive bladder) No date: Osteoarthritis 02/10/2016: Osteopenia determined by x-ray No date: PVC (premature ventricular contraction) No date: Recurrent UTI No date: Urinary incontinence No date: Vertigo  Past Surgical History: 11/11/2015: COLONOSCOPY WITH PROPOFOL ;  N/A     Comment:  Procedure: COLONOSCOPY WITH PROPOFOL ;  Surgeon: Gladis RAYMOND Mariner, MD;  Location: [facility] ENDOSCOPY;  Service:               Endoscopy;  Laterality: N/A; 06/24/2018: COLONOSCOPY WITH PROPOFOL ; N/A     Comment:  Procedure: COLONOSCOPY WITH PROPOFOL ;  Surgeon:               Mariner Gladis RAYMOND, MD;  Location: [facility] ENDOSCOPY;                Service: Endoscopy;  Laterality: N/A; 11/13/2013: ESOPHAGOGASTRODUODENOSCOPY 08/03/2018: ILEOSTOMY; N/A     Comment:  Procedure: ILEOSTOMY;  Surgeon: Jordis Laneta FALCON, MD;                Location: [facility] ORS;  Service: General;  Laterality: N/A; 12/30/2018: ILEOSTOMY CLOSURE; N/A     Comment:  Procedure: ILEOSTOMY TAKEDOWN;  Surgeon: Jordis Laneta FALCON,              MD;  Location: [facility] ORS;  Service: General;  Laterality:               N/A; 07/29/2018: LAPAROSCOPIC RIGHT COLECTOMY; N/A     Comment:  Procedure: LAPAROSCOPIC SIGMOID COLECTOMY;  Surgeon:               Jordis Laneta FALCON, MD;  Location: [facility] ORS;  Service:               General;  Laterality: N/A; 08/03/2018: LAPAROTOMY; N/A     Comment:  Procedure: EXPLORATORY LAPAROTOMY;  Surgeon: Jordis,  Diego F, MD;  Location: [facility] ORS;  Service: General;                Laterality: N/A; No date: OOPHORECTOMY Jan 2012: TOTAL ABDOMINAL HYSTERECTOMY     Comment:  Complete, due to heavy bleeding and grape-fruit size               fibroid   BMI    Body Mass Index: 21.51 kg/m      Reproductive/Obstetrics negative OB ROS                              Anesthesia Physical Anesthesia Plan  ASA: 2  Anesthesia Plan: General   Post-op Pain Management:    Induction:   PONV Risk Score and Plan: Propofol  infusion and TIVA  Airway Management Planned: Natural Airway and Nasal Cannula  Additional Equipment:   Intra-op Plan:   Post-operative Plan:   Informed Consent: I have reviewed the patients History and Physical, chart, labs and discussed the  procedure including the risks, benefits and alternatives for the proposed anesthesia with the patient or authorized representative who has indicated his/her understanding and acceptance.     Dental Advisory Given  Plan Discussed with: CRNA  Anesthesia Plan Comments:         Anesthesia Quick Evaluation

## 2024-04-27 LAB — SURGICAL PATHOLOGY

## 2024-04-30 ENCOUNTER — Ambulatory Visit
Admission: RE | Admit: 2024-04-30 | Discharge: 2024-04-30 | Disposition: A | Discharge status: Home / Self Care | Source: Ambulatory Visit | Attending: Internal Medicine | Admitting: Internal Medicine

## 2024-04-30 ENCOUNTER — Ambulatory Visit: Admission: RE | Admit: 2024-04-30 | Discharge: 2024-04-30 | Attending: Internal Medicine | Admitting: Internal Medicine

## 2024-04-30 DIAGNOSIS — K828 Other specified diseases of gallbladder: Secondary | ICD-10-CM | POA: Diagnosis present

## 2024-04-30 MED ORDER — TECHNETIUM TC 99M MEBROFENIN IV KIT
5.1200 | PACK | Freq: Once | INTRAVENOUS | Status: AC | PRN
  Administered 2024-04-30: 5.12 via INTRAVENOUS

## 2024-05-04 ENCOUNTER — Ambulatory Visit: Admitting: Surgery

## 2024-05-18 ENCOUNTER — Ambulatory Visit: Admitting: Surgery

## 2024-05-19 ENCOUNTER — Ambulatory Visit
Admission: RE | Admit: 2024-05-19 | Discharge: 2024-05-19 | Disposition: A | Discharge status: Home / Self Care | Source: Ambulatory Visit | Attending: Internal Medicine | Admitting: Internal Medicine

## 2024-05-19 DIAGNOSIS — Z1231 Encounter for screening mammogram for malignant neoplasm of breast: Secondary | ICD-10-CM | POA: Diagnosis present

## 2024-09-02 ENCOUNTER — Ambulatory Visit: Payer: BC Managed Care – PPO | Admitting: Urology
